# Patient Record
Sex: Female | Born: 1935 | Race: White | Hispanic: No | Smoking: Former smoker
Health system: Southern US, Community
[De-identification: ages and names within clinical notes are randomized; demographics above are authoritative.]

## PROBLEM LIST (undated history)

## (undated) DIAGNOSIS — C9190 Lymphoid leukemia, unspecified not having achieved remission: Secondary | ICD-10-CM

## (undated) DIAGNOSIS — A159 Respiratory tuberculosis unspecified: Secondary | ICD-10-CM

## (undated) DIAGNOSIS — I10 Essential (primary) hypertension: Secondary | ICD-10-CM

## (undated) DIAGNOSIS — H9190 Unspecified hearing loss, unspecified ear: Secondary | ICD-10-CM

## (undated) DIAGNOSIS — E785 Hyperlipidemia, unspecified: Secondary | ICD-10-CM

## (undated) DIAGNOSIS — I4510 Unspecified right bundle-branch block: Secondary | ICD-10-CM

## (undated) DIAGNOSIS — M1990 Unspecified osteoarthritis, unspecified site: Secondary | ICD-10-CM

## (undated) DIAGNOSIS — M8580 Other specified disorders of bone density and structure, unspecified site: Secondary | ICD-10-CM

## (undated) DIAGNOSIS — I509 Heart failure, unspecified: Secondary | ICD-10-CM

## (undated) DIAGNOSIS — D649 Anemia, unspecified: Secondary | ICD-10-CM

## (undated) DIAGNOSIS — C50919 Malignant neoplasm of unspecified site of unspecified female breast: Secondary | ICD-10-CM

## (undated) HISTORY — PX: DIAGNOSTIC LAPAROSCOPY: SUR761

## (undated) HISTORY — PX: APPENDECTOMY: SHX54

## (undated) HISTORY — PX: EYE SURGERY: SHX253

---

## 2000-08-15 ENCOUNTER — Ambulatory Visit (HOSPITAL_BASED_OUTPATIENT_CLINIC_OR_DEPARTMENT_OTHER): Admission: RE | Admit: 2000-08-15 | Discharge: 2000-08-15 | Payer: Self-pay | Admitting: General Surgery

## 2000-08-18 ENCOUNTER — Encounter: Payer: Self-pay | Admitting: Family Medicine

## 2000-08-18 ENCOUNTER — Other Ambulatory Visit: Admission: RE | Admit: 2000-08-18 | Discharge: 2000-08-18 | Payer: Self-pay | Admitting: Family Medicine

## 2000-08-18 ENCOUNTER — Encounter: Admission: RE | Admit: 2000-08-18 | Discharge: 2000-08-18 | Payer: Self-pay | Admitting: Family Medicine

## 2000-08-22 ENCOUNTER — Encounter: Admission: RE | Admit: 2000-08-22 | Discharge: 2000-08-22 | Payer: Self-pay | Admitting: Family Medicine

## 2000-08-22 ENCOUNTER — Encounter: Payer: Self-pay | Admitting: Family Medicine

## 2001-08-30 ENCOUNTER — Encounter: Payer: Self-pay | Admitting: Family Medicine

## 2001-08-30 ENCOUNTER — Encounter: Admission: RE | Admit: 2001-08-30 | Discharge: 2001-08-30 | Payer: Self-pay | Admitting: Family Medicine

## 2002-10-07 ENCOUNTER — Encounter: Payer: Self-pay | Admitting: Family Medicine

## 2002-10-07 ENCOUNTER — Encounter: Admission: RE | Admit: 2002-10-07 | Discharge: 2002-10-07 | Payer: Self-pay | Admitting: Family Medicine

## 2002-10-29 ENCOUNTER — Encounter: Payer: Self-pay | Admitting: Family Medicine

## 2002-10-29 ENCOUNTER — Other Ambulatory Visit: Admission: RE | Admit: 2002-10-29 | Discharge: 2002-10-29 | Payer: Self-pay | Admitting: Family Medicine

## 2002-10-29 ENCOUNTER — Encounter: Admission: RE | Admit: 2002-10-29 | Discharge: 2002-10-29 | Payer: Self-pay | Admitting: Family Medicine

## 2003-04-17 ENCOUNTER — Encounter: Admission: RE | Admit: 2003-04-17 | Discharge: 2003-04-17 | Payer: Self-pay | Admitting: Family Medicine

## 2003-04-17 ENCOUNTER — Encounter: Payer: Self-pay | Admitting: Family Medicine

## 2003-04-29 ENCOUNTER — Encounter: Admission: RE | Admit: 2003-04-29 | Discharge: 2003-04-29 | Payer: Self-pay | Admitting: Family Medicine

## 2003-04-29 ENCOUNTER — Encounter: Payer: Self-pay | Admitting: Family Medicine

## 2003-06-17 ENCOUNTER — Encounter: Admission: RE | Admit: 2003-06-17 | Discharge: 2003-07-11 | Payer: Self-pay | Admitting: Family Medicine

## 2003-07-16 ENCOUNTER — Encounter: Admission: RE | Admit: 2003-07-16 | Discharge: 2003-10-14 | Payer: Self-pay | Admitting: Family Medicine

## 2003-10-28 ENCOUNTER — Encounter: Admission: RE | Admit: 2003-10-28 | Discharge: 2003-10-28 | Payer: Self-pay | Admitting: Family Medicine

## 2003-10-29 ENCOUNTER — Encounter: Admission: RE | Admit: 2003-10-29 | Discharge: 2003-10-29 | Payer: Self-pay | Admitting: Family Medicine

## 2003-11-07 ENCOUNTER — Ambulatory Visit (HOSPITAL_COMMUNITY): Admission: RE | Admit: 2003-11-07 | Discharge: 2003-11-07 | Payer: Self-pay | Admitting: Family Medicine

## 2004-07-14 ENCOUNTER — Other Ambulatory Visit: Admission: RE | Admit: 2004-07-14 | Discharge: 2004-07-14 | Payer: Self-pay | Admitting: Family Medicine

## 2004-11-29 ENCOUNTER — Encounter: Admission: RE | Admit: 2004-11-29 | Discharge: 2004-11-29 | Payer: Self-pay | Admitting: Family Medicine

## 2005-01-21 ENCOUNTER — Other Ambulatory Visit: Admission: RE | Admit: 2005-01-21 | Discharge: 2005-01-21 | Payer: Self-pay | Admitting: Family Medicine

## 2005-07-28 ENCOUNTER — Other Ambulatory Visit: Admission: RE | Admit: 2005-07-28 | Discharge: 2005-07-28 | Payer: Self-pay | Admitting: Family Medicine

## 2005-12-01 ENCOUNTER — Encounter: Admission: RE | Admit: 2005-12-01 | Discharge: 2005-12-01 | Payer: Self-pay | Admitting: Family Medicine

## 2005-12-07 ENCOUNTER — Encounter: Admission: RE | Admit: 2005-12-07 | Discharge: 2005-12-07 | Payer: Self-pay | Admitting: Family Medicine

## 2006-05-04 ENCOUNTER — Encounter: Admission: RE | Admit: 2006-05-04 | Discharge: 2006-05-04 | Payer: Self-pay | Admitting: Family Medicine

## 2006-06-05 ENCOUNTER — Encounter: Admission: RE | Admit: 2006-06-05 | Discharge: 2006-06-05 | Payer: Self-pay | Admitting: Family Medicine

## 2006-06-13 ENCOUNTER — Encounter (INDEPENDENT_AMBULATORY_CARE_PROVIDER_SITE_OTHER): Payer: Self-pay | Admitting: Diagnostic Radiology

## 2006-06-13 ENCOUNTER — Encounter: Admission: RE | Admit: 2006-06-13 | Discharge: 2006-06-13 | Payer: Self-pay | Admitting: Family Medicine

## 2006-06-13 ENCOUNTER — Encounter (INDEPENDENT_AMBULATORY_CARE_PROVIDER_SITE_OTHER): Payer: Self-pay | Admitting: Specialist

## 2006-06-15 ENCOUNTER — Encounter: Admission: RE | Admit: 2006-06-15 | Discharge: 2006-06-15 | Payer: Self-pay | Admitting: Family Medicine

## 2006-06-19 ENCOUNTER — Ambulatory Visit: Payer: Self-pay | Admitting: Cardiology

## 2006-06-21 ENCOUNTER — Encounter: Admission: RE | Admit: 2006-06-21 | Discharge: 2006-06-21 | Payer: Self-pay | Admitting: Family Medicine

## 2006-07-11 HISTORY — PX: BREAST SURGERY: SHX581

## 2006-07-11 HISTORY — PX: BIOPSY BREAST: PRO8

## 2006-07-12 ENCOUNTER — Ambulatory Visit (HOSPITAL_BASED_OUTPATIENT_CLINIC_OR_DEPARTMENT_OTHER): Admission: RE | Admit: 2006-07-12 | Discharge: 2006-07-12 | Payer: Self-pay | Admitting: General Surgery

## 2006-07-12 ENCOUNTER — Encounter (INDEPENDENT_AMBULATORY_CARE_PROVIDER_SITE_OTHER): Payer: Self-pay | Admitting: Specialist

## 2006-07-12 ENCOUNTER — Encounter: Admission: RE | Admit: 2006-07-12 | Discharge: 2006-07-12 | Payer: Self-pay | Admitting: General Surgery

## 2006-07-12 DIAGNOSIS — D0590 Unspecified type of carcinoma in situ of unspecified breast: Secondary | ICD-10-CM

## 2006-07-19 ENCOUNTER — Ambulatory Visit: Payer: Self-pay | Admitting: Oncology

## 2006-08-01 ENCOUNTER — Ambulatory Visit: Admission: RE | Admit: 2006-08-01 | Discharge: 2006-10-11 | Payer: Self-pay | Admitting: Radiation Oncology

## 2006-08-07 ENCOUNTER — Encounter: Payer: Self-pay | Admitting: Oncology

## 2006-08-07 ENCOUNTER — Other Ambulatory Visit: Admission: RE | Admit: 2006-08-07 | Discharge: 2006-08-07 | Payer: Self-pay | Admitting: Oncology

## 2006-08-07 LAB — COMPREHENSIVE METABOLIC PANEL
AST: 21 U/L (ref 0–37)
Albumin: 4.2 g/dL (ref 3.5–5.2)
BUN: 20 mg/dL (ref 6–23)
CO2: 27 mEq/L (ref 19–32)
Calcium: 9.7 mg/dL (ref 8.4–10.5)
Chloride: 102 mEq/L (ref 96–112)
Glucose, Bld: 125 mg/dL — ABNORMAL HIGH (ref 70–99)
Potassium: 3.7 mEq/L (ref 3.5–5.3)

## 2006-08-07 LAB — CBC WITH DIFFERENTIAL/PLATELET
Basophils Absolute: 0.2 10*3/uL — ABNORMAL HIGH (ref 0.0–0.1)
Eosinophils Absolute: 0.2 10*3/uL (ref 0.0–0.5)
HCT: 42.3 % (ref 34.8–46.6)
HGB: 14.3 g/dL (ref 11.6–15.9)
MONO#: 0.7 10*3/uL (ref 0.1–0.9)
NEUT%: 43.2 % (ref 39.6–76.8)
WBC: 17.5 10*3/uL — ABNORMAL HIGH (ref 3.9–10.0)
lymph#: 8.9 10*3/uL — ABNORMAL HIGH (ref 0.9–3.3)

## 2006-08-14 ENCOUNTER — Other Ambulatory Visit: Admission: RE | Admit: 2006-08-14 | Discharge: 2006-08-14 | Payer: Self-pay | Admitting: Family Medicine

## 2006-08-16 ENCOUNTER — Other Ambulatory Visit: Admission: RE | Admit: 2006-08-16 | Discharge: 2006-08-16 | Payer: Self-pay | Admitting: Oncology

## 2006-08-16 ENCOUNTER — Encounter: Payer: Self-pay | Admitting: Oncology

## 2006-08-16 LAB — CBC WITH DIFFERENTIAL/PLATELET
BASO%: 0.6 % (ref 0.0–2.0)
EOS%: 0.7 % (ref 0.0–7.0)
HCT: 41.3 % (ref 34.8–46.6)
LYMPH%: 50.3 % — ABNORMAL HIGH (ref 14.0–48.0)
MCH: 27.5 pg (ref 26.0–34.0)
MCHC: 34.1 g/dL (ref 32.0–36.0)
NEUT%: 43.9 % (ref 39.6–76.8)
Platelets: 356 10*3/uL (ref 145–400)
RBC: 5.11 10*6/uL (ref 3.70–5.32)
lymph#: 7.2 10*3/uL — ABNORMAL HIGH (ref 0.9–3.3)

## 2006-08-16 LAB — MORPHOLOGY: RBC Comments: NORMAL

## 2006-08-16 LAB — IGG, IGA, IGM
IgA: 403 mg/dL — ABNORMAL HIGH (ref 68–378)
IgM, Serum: 27 mg/dL — ABNORMAL LOW (ref 60–263)

## 2006-10-09 ENCOUNTER — Ambulatory Visit: Payer: Self-pay | Admitting: Oncology

## 2006-10-11 LAB — CBC WITH DIFFERENTIAL/PLATELET
Basophils Absolute: 0.1 10*3/uL (ref 0.0–0.1)
Eosinophils Absolute: 0.1 10*3/uL (ref 0.0–0.5)
HCT: 39.2 % (ref 34.8–46.6)
HGB: 13.8 g/dL (ref 11.6–15.9)
MCV: 81.1 fL (ref 81.0–101.0)
MONO%: 5.7 % (ref 0.0–13.0)
NEUT#: 5.4 10*3/uL (ref 1.5–6.5)
NEUT%: 61.7 % (ref 39.6–76.8)
RDW: 14.4 % (ref 11.3–14.5)
lymph#: 2.7 10*3/uL (ref 0.9–3.3)

## 2006-10-16 ENCOUNTER — Ambulatory Visit (HOSPITAL_COMMUNITY): Admission: RE | Admit: 2006-10-16 | Discharge: 2006-10-16 | Payer: Self-pay | Admitting: Oncology

## 2006-10-16 ENCOUNTER — Encounter: Payer: Self-pay | Admitting: Vascular Surgery

## 2006-10-16 ENCOUNTER — Ambulatory Visit: Payer: Self-pay | Admitting: Vascular Surgery

## 2006-12-29 ENCOUNTER — Ambulatory Visit: Payer: Self-pay | Admitting: Oncology

## 2007-01-03 LAB — CBC WITH DIFFERENTIAL/PLATELET
Basophils Absolute: 0 10*3/uL (ref 0.0–0.1)
Eosinophils Absolute: 0.1 10*3/uL (ref 0.0–0.5)
HGB: 13.1 g/dL (ref 11.6–15.9)
LYMPH%: 33.6 % (ref 14.0–48.0)
MCV: 82.8 fL (ref 81.0–101.0)
MONO%: 6.3 % (ref 0.0–13.0)
NEUT#: 5.3 10*3/uL (ref 1.5–6.5)
Platelets: 326 10*3/uL (ref 145–400)

## 2007-01-03 LAB — COMPREHENSIVE METABOLIC PANEL
AST: 16 U/L (ref 0–37)
Albumin: 3.8 g/dL (ref 3.5–5.2)
BUN: 23 mg/dL (ref 6–23)
CO2: 28 mEq/L (ref 19–32)
Calcium: 9.3 mg/dL (ref 8.4–10.5)
Chloride: 104 mEq/L (ref 96–112)
Glucose, Bld: 175 mg/dL — ABNORMAL HIGH (ref 70–99)
Potassium: 3.9 mEq/L (ref 3.5–5.3)

## 2007-02-13 ENCOUNTER — Encounter: Admission: RE | Admit: 2007-02-13 | Discharge: 2007-02-13 | Payer: Self-pay | Admitting: Family Medicine

## 2007-05-15 ENCOUNTER — Ambulatory Visit: Payer: Self-pay | Admitting: Oncology

## 2007-05-17 LAB — COMPREHENSIVE METABOLIC PANEL
Albumin: 3.9 g/dL (ref 3.5–5.2)
Alkaline Phosphatase: 52 U/L (ref 39–117)
BUN: 30 mg/dL — ABNORMAL HIGH (ref 6–23)
Glucose, Bld: 162 mg/dL — ABNORMAL HIGH (ref 70–99)
Total Bilirubin: 0.4 mg/dL (ref 0.3–1.2)

## 2007-05-17 LAB — CBC WITH DIFFERENTIAL/PLATELET
Eosinophils Absolute: 0.1 10*3/uL (ref 0.0–0.5)
MCV: 83.2 fL (ref 81.0–101.0)
MONO%: 5.5 % (ref 0.0–13.0)
NEUT#: 6.4 10*3/uL (ref 1.5–6.5)
RBC: 4.81 10*6/uL (ref 3.70–5.32)
RDW: 14.6 % — ABNORMAL HIGH (ref 11.3–14.5)
WBC: 10.9 10*3/uL — ABNORMAL HIGH (ref 3.9–10.0)

## 2007-11-20 ENCOUNTER — Ambulatory Visit: Payer: Self-pay | Admitting: Oncology

## 2008-02-14 ENCOUNTER — Encounter: Admission: RE | Admit: 2008-02-14 | Discharge: 2008-02-14 | Payer: Self-pay | Admitting: Oncology

## 2008-02-22 ENCOUNTER — Encounter: Admission: RE | Admit: 2008-02-22 | Discharge: 2008-02-22 | Payer: Self-pay | Admitting: Oncology

## 2008-05-16 ENCOUNTER — Ambulatory Visit: Payer: Self-pay | Admitting: Oncology

## 2008-05-20 LAB — CBC WITH DIFFERENTIAL/PLATELET
Basophils Absolute: 0 10*3/uL (ref 0.0–0.1)
Eosinophils Absolute: 0.2 10*3/uL (ref 0.0–0.5)
HGB: 14.4 g/dL (ref 11.6–15.9)
LYMPH%: 44.4 % (ref 14.0–48.0)
MCV: 85.3 fL (ref 81.0–101.0)
MONO%: 6.4 % (ref 0.0–13.0)
NEUT#: 6.2 10*3/uL (ref 1.5–6.5)
NEUT%: 47.7 % (ref 39.6–76.8)
Platelets: 313 10*3/uL (ref 145–400)

## 2008-05-20 LAB — COMPREHENSIVE METABOLIC PANEL
CO2: 28 mEq/L (ref 19–32)
Creatinine, Ser: 0.78 mg/dL (ref 0.40–1.20)
Glucose, Bld: 104 mg/dL — ABNORMAL HIGH (ref 70–99)
Total Bilirubin: 0.4 mg/dL (ref 0.3–1.2)
Total Protein: 7.7 g/dL (ref 6.0–8.3)

## 2008-05-20 LAB — LACTATE DEHYDROGENASE: LDH: 142 U/L (ref 94–250)

## 2008-05-27 ENCOUNTER — Other Ambulatory Visit: Admission: RE | Admit: 2008-05-27 | Discharge: 2008-05-27 | Payer: Self-pay | Admitting: Oncology

## 2008-05-27 ENCOUNTER — Encounter: Payer: Self-pay | Admitting: Oncology

## 2008-05-27 LAB — CBC WITH DIFFERENTIAL/PLATELET
Eosinophils Absolute: 0.1 10*3/uL (ref 0.0–0.5)
HGB: 14.2 g/dL (ref 11.6–15.9)
MONO#: 0.6 10*3/uL (ref 0.1–0.9)
NEUT#: 6.4 10*3/uL (ref 1.5–6.5)
Platelets: 330 10*3/uL (ref 145–400)
RBC: 4.93 10*6/uL (ref 3.70–5.32)
RDW: 13.7 % (ref 11.3–14.5)
WBC: 13.6 10*3/uL — ABNORMAL HIGH (ref 3.9–10.0)

## 2008-06-03 LAB — FLOW CYTOMETRY

## 2008-07-11 HISTORY — PX: ABDOMINAL HYSTERECTOMY: SHX81

## 2008-07-11 HISTORY — PX: ENDOMETRIAL BIOPSY: SHX622

## 2008-07-15 ENCOUNTER — Other Ambulatory Visit: Admission: RE | Admit: 2008-07-15 | Discharge: 2008-07-15 | Payer: Self-pay | Admitting: Family Medicine

## 2008-08-11 DIAGNOSIS — I10 Essential (primary) hypertension: Secondary | ICD-10-CM

## 2008-08-11 DIAGNOSIS — I519 Heart disease, unspecified: Secondary | ICD-10-CM

## 2008-08-11 DIAGNOSIS — E785 Hyperlipidemia, unspecified: Secondary | ICD-10-CM

## 2008-08-11 DIAGNOSIS — I4510 Unspecified right bundle-branch block: Secondary | ICD-10-CM

## 2008-08-12 ENCOUNTER — Ambulatory Visit: Payer: Self-pay | Admitting: Cardiology

## 2008-08-12 ENCOUNTER — Encounter: Payer: Self-pay | Admitting: Cardiology

## 2008-08-12 ENCOUNTER — Ambulatory Visit: Payer: Self-pay

## 2008-08-20 ENCOUNTER — Ambulatory Visit: Payer: Self-pay

## 2008-12-03 ENCOUNTER — Ambulatory Visit: Payer: Self-pay | Admitting: Oncology

## 2008-12-16 ENCOUNTER — Encounter: Payer: Self-pay | Admitting: Cardiology

## 2008-12-16 LAB — CBC WITH DIFFERENTIAL/PLATELET
BASO%: 0.6 % (ref 0.0–2.0)
LYMPH%: 56.6 % — ABNORMAL HIGH (ref 14.0–49.7)
MCHC: 32.8 g/dL (ref 31.5–36.0)
MCV: 85 fL (ref 79.5–101.0)
MONO%: 10.7 % (ref 0.0–14.0)
Platelets: 216 10*3/uL (ref 145–400)
RBC: 3.87 10*6/uL (ref 3.70–5.45)
WBC: 6.9 10*3/uL (ref 3.9–10.3)

## 2008-12-16 LAB — MORPHOLOGY

## 2008-12-17 LAB — COMPREHENSIVE METABOLIC PANEL
Albumin: 3.6 g/dL (ref 3.5–5.2)
BUN: 18 mg/dL (ref 6–23)
Calcium: 9.3 mg/dL (ref 8.4–10.5)
Chloride: 101 mEq/L (ref 96–112)
Glucose, Bld: 174 mg/dL — ABNORMAL HIGH (ref 70–99)
Potassium: 3.5 mEq/L (ref 3.5–5.3)

## 2008-12-17 LAB — CANCER ANTIGEN 27.29: CA 27.29: 28 U/mL (ref 0–39)

## 2009-01-09 ENCOUNTER — Ambulatory Visit: Payer: Self-pay | Admitting: Genetic Counselor

## 2009-02-17 ENCOUNTER — Encounter: Admission: RE | Admit: 2009-02-17 | Discharge: 2009-02-17 | Payer: Self-pay | Admitting: Oncology

## 2009-05-07 ENCOUNTER — Encounter (INDEPENDENT_AMBULATORY_CARE_PROVIDER_SITE_OTHER): Payer: Self-pay | Admitting: Orthopedic Surgery

## 2009-05-07 ENCOUNTER — Ambulatory Visit (HOSPITAL_BASED_OUTPATIENT_CLINIC_OR_DEPARTMENT_OTHER): Admission: RE | Admit: 2009-05-07 | Discharge: 2009-05-07 | Payer: Self-pay | Admitting: Orthopedic Surgery

## 2009-07-11 HISTORY — PX: GANGLION CYST EXCISION: SHX1691

## 2009-11-02 ENCOUNTER — Encounter (INDEPENDENT_AMBULATORY_CARE_PROVIDER_SITE_OTHER): Payer: Self-pay | Admitting: *Deleted

## 2009-11-16 ENCOUNTER — Encounter (INDEPENDENT_AMBULATORY_CARE_PROVIDER_SITE_OTHER): Payer: Self-pay | Admitting: *Deleted

## 2009-11-17 ENCOUNTER — Ambulatory Visit: Payer: Self-pay | Admitting: Internal Medicine

## 2009-12-01 ENCOUNTER — Ambulatory Visit: Payer: Self-pay | Admitting: Internal Medicine

## 2009-12-08 ENCOUNTER — Ambulatory Visit: Payer: Self-pay | Admitting: Oncology

## 2009-12-09 LAB — COMPREHENSIVE METABOLIC PANEL
ALT: 9 U/L (ref 0–35)
AST: 17 U/L (ref 0–37)
Glucose, Bld: 204 mg/dL — ABNORMAL HIGH (ref 70–99)
Potassium: 3.9 mEq/L (ref 3.5–5.3)
Sodium: 139 mEq/L (ref 135–145)
Total Protein: 6.8 g/dL (ref 6.0–8.3)

## 2009-12-09 LAB — CBC WITH DIFFERENTIAL/PLATELET
Basophils Absolute: 0.2 10*3/uL — ABNORMAL HIGH (ref 0.0–0.1)
EOS%: 0.5 % (ref 0.0–7.0)
Eosinophils Absolute: 0.1 10*3/uL (ref 0.0–0.5)
LYMPH%: 56.2 % — ABNORMAL HIGH (ref 14.0–49.7)
MCH: 30.6 pg (ref 25.1–34.0)
MONO%: 4.6 % (ref 0.0–14.0)
NEUT%: 37.2 % — ABNORMAL LOW (ref 38.4–76.8)
RBC: 4.29 10*6/uL (ref 3.70–5.45)
WBC: 15.3 10*3/uL — ABNORMAL HIGH (ref 3.9–10.3)

## 2009-12-09 LAB — VITAMIN D 25 HYDROXY (VIT D DEFICIENCY, FRACTURES): Vit D, 25-Hydroxy: 37 ng/mL (ref 30–89)

## 2009-12-09 LAB — CANCER ANTIGEN 27.29: CA 27.29: 13 U/mL (ref 0–39)

## 2009-12-09 LAB — LACTATE DEHYDROGENASE: LDH: 128 U/L (ref 94–250)

## 2009-12-14 ENCOUNTER — Encounter: Payer: Self-pay | Admitting: Cardiology

## 2010-02-19 ENCOUNTER — Encounter: Admission: RE | Admit: 2010-02-19 | Discharge: 2010-02-19 | Payer: Self-pay | Admitting: Oncology

## 2010-06-16 ENCOUNTER — Ambulatory Visit: Payer: Self-pay | Admitting: Oncology

## 2010-06-18 LAB — CBC WITH DIFFERENTIAL/PLATELET
Basophils Absolute: 0.1 10*3/uL (ref 0.0–0.1)
HCT: 36.7 % (ref 34.8–46.6)
HGB: 12.4 g/dL (ref 11.6–15.9)
LYMPH%: 59.5 % — ABNORMAL HIGH (ref 14.0–49.7)
NEUT%: 35.3 % — ABNORMAL LOW (ref 38.4–76.8)
Platelets: 227 10*3/uL (ref 145–400)
RBC: 4.13 10*6/uL (ref 3.70–5.45)
RDW: 14.3 % (ref 11.2–14.5)

## 2010-06-18 LAB — COMPREHENSIVE METABOLIC PANEL
ALT: 14 U/L (ref 0–35)
Alkaline Phosphatase: 41 U/L (ref 39–117)
Calcium: 9.4 mg/dL (ref 8.4–10.5)
Glucose, Bld: 139 mg/dL — ABNORMAL HIGH (ref 70–99)
Total Bilirubin: 0.4 mg/dL (ref 0.3–1.2)
Total Protein: 6.8 g/dL (ref 6.0–8.3)

## 2010-06-21 LAB — BETA 2 MICROGLOBULIN, SERUM: Beta-2 Microglobulin: 2.71 mg/L — ABNORMAL HIGH (ref 1.01–1.73)

## 2010-06-25 ENCOUNTER — Encounter: Payer: Self-pay | Admitting: Cardiology

## 2010-08-01 ENCOUNTER — Encounter: Payer: Self-pay | Admitting: Family Medicine

## 2010-08-10 NOTE — Letter (Signed)
[facility]   Imported By: Roderic Ovens 01/23/2010 10:05:23  _____________________________________________________________________  External Attachment:    Type:   Image     Comment:   External Document

## 2010-08-10 NOTE — Procedures (Signed)
Summary: Colonoscopy  Patient: Margaret Mann Note: All result statuses are Final unless otherwise noted.  Tests: (1) Colonoscopy (COL)   COL Colonoscopy           DONE     [facility]     [address]     [place], [place]  [address]           COLONOSCOPY PROCEDURE REPORT           PATIENT:  Margaret, Mann  MR#:  [id number]     BIRTHDATE:  01/17/1933, 76 yrs. old  GENDER:  female     ENDOSCOPIST:  Iva Boop, MD, [facility]           PROCEDURE DATE:  12/01/2009     PROCEDURE:  Higher-risk screening colonoscopy G0105           ASA CLASS:  Class III     INDICATIONS:  surveillance and high-risk screening, personal     history of colon polyps     1993 3 mm polyp destroyed     1997 3 polyps removed/destroyed (no pathology available)     2000 no polyps     2004 no polyps     mother with colon cancer diagnosed in her 26's     MEDICATIONS:   Fentanyl 50 mcg IV, Versed 5 mg IV           DESCRIPTION OF PROCEDURE:   After the risks benefits and     alternatives of the procedure were thoroughly explained, informed     consent was obtained.  Digital rectal exam was performed and     revealed no abnormalities.   The LB PCF-H180AL [id number] endoscope     was introduced through the anus and advanced to the cecum, which     was identified by both the appendix and ileocecal valve, without     limitations.  The quality of the prep was good, using MoviPrep.     The instrument was then slowly withdrawn as the colon was fully     examined.     Insertion: 4:47 minutes Withdrawal: 8:43 minutes     <<PROCEDUREIMAGES>>           FINDINGS:  Mild diverticulosis was found in the sigmoid colon.     This was otherwise a normal examination of the colon.   Retroflexed     views in the rectum revealed internal and external hemorrhoids.     The scope was then withdrawn from the patient and the procedure     completed.           COMPLICATIONS:  None     ENDOSCOPIC IMPRESSION:     1) Mild  diverticulosis in the sigmoid colon     2) Otherwise normal examination of the colon with excellent prep           3) Internal and external hemorrhoids in the ano-rectum     4) Personal history of colon polyps removed 1993 and 1997, none     nce     5) Elderly mother with colon cancer (>70)           REPEAT EXAM:  In 5 year(s) for Office Visit.  She has had     endometrial cancer also and can be reassessed for colon cancer     screening/surveillance in 5 years but would not place in for     routine repeat colonoscopy at this time.  Iva Boop, MD, [facility]           CC:  Maurice Small, MD     Pierce Crane, MD     The Patient           n.     eSIGNED:   Iva Boop at 12/01/2009 11:43 AM           Artist Pais, [id number]  Note: An exclamation mark (!) indicates a result that was not dispersed into the flowsheet. Document Creation Date: 12/01/2009 11:44 AM _______________________________________________________________________  (1) Order result status: Final Collection or observation date-time: 12/01/2009 11:31 Requested date-time:  Receipt date-time:  Reported date-time:  Referring Physician:   Ordering Physician: Stan Head [phone]) Specimen Source:  Source: Launa Grill Order Number: [id number] Lab site:

## 2010-08-10 NOTE — Letter (Signed)
Summary: [facility] Instructions  [facility]  [address] [place], [place] [address]   Phone: [phone]  Fax: [phone]       ICA DAYE    1933/04/27    MRN: [id number]        Procedure Day Dorna Bloom:  Jake Shark  12/01/09     Arrival Time:  10:30AM     Procedure Time:  11:30AM     Location of Procedure:                    _ X_  [facility] (4th Floor)                       PREPARATION FOR COLONOSCOPY WITH MOVIPREP   Starting 5 days prior to your procedure 11/26/09 do not eat nuts, seeds, popcorn, corn, beans, peas,  salads, or any raw vegetables.  Do not take any fiber supplements (e.g. Metamucil, Citrucel, and Benefiber).  THE DAY BEFORE YOUR PROCEDURE         DATE: 11/30/09  DAY: MONDAY  1.  Drink clear liquids the entire day-NO SOLID FOOD  2.  Do not drink anything colored red or purple.  Avoid juices with pulp.  No orange juice.  3.  Drink at least 64 oz. (8 glasses) of fluid/clear liquids during the day to prevent dehydration and help the prep work efficiently.  CLEAR LIQUIDS INCLUDE: Water Jello Ice Popsicles Tea (sugar ok, no milk/cream) Powdered fruit flavored drinks Coffee (sugar ok, no milk/cream) Gatorade Juice: apple, white grape, white cranberry  Lemonade Clear bullion, consomm, broth Carbonated beverages (any kind) Strained chicken noodle soup Hard Candy                             4.  In the morning, mix first dose of MoviPrep solution:    Empty 1 Pouch A and 1 Pouch B into the disposable container    Add lukewarm drinking water to the top line of the container. Mix to dissolve    Refrigerate (mixed solution should be used within 24 hrs)  5.  Begin drinking the prep at 5:00 p.m. The MoviPrep container is divided by 4 marks.   Every 15 minutes drink the solution down to the next mark (approximately 8 oz) until the full liter is complete.   6.  Follow completed prep with 16 oz of clear liquid of your choice  (Nothing red or purple).  Continue to drink clear liquids until bedtime.  7.  Before going to bed, mix second dose of MoviPrep solution:    Empty 1 Pouch A and 1 Pouch B into the disposable container    Add lukewarm drinking water to the top line of the container. Mix to dissolve    Refrigerate  THE DAY OF YOUR PROCEDURE      DATE: 12/01/09  DAY: TUESDAY  Beginning at 6:30AM (5 hours before procedure):         1. Every 15 minutes, drink the solution down to the next mark (approx 8 oz) until the full liter is complete.  2. Follow completed prep with 16 oz. of clear liquid of your choice.    3. You may drink clear liquids until 9:30AM (2 HOURS BEFORE PROCEDURE).   MEDICATION INSTRUCTIONS  Unless otherwise instructed, you should take regular prescription medications with a small sip of water   as early as possible the morning of  your procedure.   Additional medication instructions: Hold HCTZ the morning of procedure.         OTHER INSTRUCTIONS  You will need a responsible adult at least 75 years of age to accompany you and drive you home.   This person must remain in the waiting room during your procedure.  Wear loose fitting clothing that is easily removed.  Leave jewelry and other valuables at home.  However, you may wish to bring a book to read or  an iPod/MP3 player to listen to music as you wait for your procedure to start.  Remove all body piercing jewelry and leave at home.  Total time from sign-in until discharge is approximately 2-3 hours.  You should go home directly after your procedure and rest.  You can resume normal activities the  day after your procedure.  The day of your procedure you should not:   Drive   Make legal decisions   Operate machinery   Drink alcohol   Return to work  You will receive specific instructions about eating, activities and medications before you leave.    The above instructions have been reviewed and explained to  me by   _______________________    I fully understand and can verbalize these instructions _____________________________ Date _________

## 2010-08-10 NOTE — Letter (Signed)
Summary: Colonoscopy Letter  [facility]  [address] [place], [place] [address]   Phone: [phone]  Fax: [phone]      November 02, 2009 MRN: [id number]   Margaret Mann [address] [place], [place]  [address]   Dear Ms. Feehan,   According to your medical record, it is time for you to schedule a Colonoscopy. The American Cancer Society recommends this procedure as a method to detect early colon cancer. Patients with a family history of colon cancer, or a personal history of colon polyps or inflammatory bowel disease are at increased risk.  This letter has beeen generated based on the recommendations made at the time of your procedure. If you feel that in your particular situation this may no longer apply, please contact our office.  Please call our office at [phone] to schedule this appointment or to update your records at your earliest convenience.  Thank you for cooperating with [place] to provide you with the very best care possible.   Sincerely,  Iva Boop, M.D.  [facility]astro[facility] [phone]

## 2010-08-10 NOTE — Miscellaneous (Signed)
Summary: [facility] Previsit/prep  Clinical Lists Changes  Medications: Added new medication of MOVIPREP 100 GM  SOLR (PEG-KCL-NACL-NASULF-NA ASC-C) As per prep instructions. - Signed Rx of MOVIPREP 100 GM  SOLR (PEG-KCL-NACL-NASULF-NA ASC-C) As per prep instructions.;  #1 x 0;  Signed;  Entered by: Wyona Almas RN;  Authorized by: Iva Boop MD, [facility];  Method used: [facility]  [address] [phone]*, [address] [place], [place], [place]  [id number], Ph: [phone] or [phone], Fax: [phone] Observations: Added new observation of NKA: T (11/17/2009 13:46)    Prescriptions: MOVIPREP 100 GM  SOLR (PEG-KCL-NACL-NASULF-NA ASC-C) As per prep instructions.  #1 x 0   Entered by:   Wyona Almas RN   Authorized by:   Iva Boop MD, [facility]   Signed by:   Wyona Almas RN on 11/17/2009   Method used:   [facility]  [address] [phone]* (retail)       [address]       [place], [place]  [id number]       Ph: [phone] or [phone]       Fax: [phone]   RxID:   [id number]

## 2010-08-18 NOTE — Letter (Signed)
[facility]: [facility]   Imported By: Marylou Mccoy 08/09/2010 17:31:27  _____________________________________________________________________  External Attachment:    Type:   Image     Comment:   External Document

## 2010-09-21 NOTE — Progress Notes (Signed)
Summary: [facility]: Office Visit  [facility]: Office Visit   Imported By: Earl Many 09/10/2010 18:13:30  _____________________________________________________________________  External Attachment:    Type:   Image     Comment:   External Document

## 2010-10-14 LAB — POCT I-STAT, CHEM 8
BUN: 24 mg/dL — ABNORMAL HIGH (ref 6–23)
Chloride: 103 mEq/L (ref 96–112)
Creatinine, Ser: 0.8 mg/dL (ref 0.4–1.2)
Potassium: 3.3 mEq/L — ABNORMAL LOW (ref 3.5–5.1)
Sodium: 139 mEq/L (ref 135–145)

## 2010-11-23 NOTE — Letter (Signed)
August 14, 2008    Madaline Guthrie, MD  [facility]  [address]  [place], [place] [address]   RE:  ANGELLINA, FERDINAND  MRN:  [id number]  /  DOB:  04/12/1933   Dear Dr. Clifton James,   I recently saw Ms. Lamountain for evaluation prior to surgery for treatment  of uterine cancer.  She has a past history of heart failure with a well-  preserved ejection fraction.  She has had no recent symptoms.  She has a  relatively reasonable functional level.  I did evaluate her with a  repeat echocardiogram which demonstrates her ejection fraction to still  be 60%.  She does have some evidence of diastolic dysfunction with  abnormal left ventricular relaxation.  Therefore, attention should be  paid to volume management during her surgery and afterwards.  Of note,  she also had a prolonged QT on her EKG.  This is a new finding and is  probably med related.  She has had no symptomatic dysrhythmias.   Based on the above, there are no high-risk features that would preclude  the planned surgery.  She is at acceptable risk according to ACC/AHA  guidelines with again attention paid to volume management in particular.   If you have any questions about this patient, please do not hesitate to  give me a call.  My cell phone numbers [phone].  You can reach me  at the office at [phone] as well.  Thank you for your help with  this pleasant patient.    Sincerely,      Rollene Rotunda, MD, [facility]  Electronically Signed    JH/MedQ  DD: 08/14/2008  DT: 08/15/2008  Job #: [id number]

## 2010-11-23 NOTE — Assessment & Plan Note (Signed)
[facility]                            CARDIOLOGY OFFICE NOTE   NAME:Margaret Mann, Margaret Mann                       MRN:          [id number]  DATE:08/12/2008                            DOB:          12-11-1932    PRIMARY CARE PHYSICIAN:  Gretta Arab. Valentina Lucks, MD   REFERRING DOCTOR:  Madaline Guthrie, MD   REASON FOR CONSULTATION:  Evaluate the patient preoperatively.  She has  a history of diastolic heart failure.   HISTORY OF PRESENT ILLNESS:  The patient is now 77 years old.  I last  saw her in 2007 for preoperative clearance.  She was having surgery for  resection of carcinoma in situ in her right breast.  She had this  followed by radiation.  She had no chemotherapy.  She had no  complications with this.  She required no preoperative testing before  that.  She had had a history of heart failure with a well-preserved  ejection fraction in 2004.  She had had a stress perfusion study  demonstrating an EF of 70% with no evidence of ischemia or infarct.  She  had an echocardiogram demonstrating normal left ventricular function,  wall motion, and thickness.   Since that evaluation, the patient has done well.  She does her  activities of daily living.  She does not exercise routinely, but she  cares for her house.  She does vacuuming.  With this level of activity,  she does not get any chest pressure, neck or arm discomfort.  She does  not have any palpitations, presyncope, or syncope.  She does not  describe any PND or orthopnea.  She does take her medications including  diuretic.  She watches her salt.  She states that the blood pressure has  been well controlled.   She now has uterine cancer and is being considered for hysterectomy.  This may be laparoscopic, but she is not sure at this point.   PAST MEDICAL HISTORY:  1. Hypertension.  2. 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  3. Newly diagnosed uterine  cancer.  4. Heart failure, well-preserved ejection fraction.  5. Dyslipidemia.   PAST SURGICAL HISTORY:  Breast surgery as described and cyst removed  from her breast.   ALLERGIES:  None.   MEDICATIONS:  1. Caduet 10/40 daily.  2. Fexofenadine 180 mg daily.  3. Hydrochlorothiazide 25 mg daily.  4. Klor-Con 20 mEq daily.  5. Tamoxifen 20 mg daily.  6. Aspirin 81 mg daily.  7. Calcium.  8. Multivitamin.  9. Fluocinonide cream.  10.Clobetasol cream.   SOCIAL HISTORY:  The patient is retired from [employer].  She is  married.  She has no children.  She quit smoking in college.  She rarely  drinks alcohol.   FAMILY HISTORY:  Noncontributory for early coronary artery disease.   REVIEW OF SYSTEMS:  As stated in the HPI, and otherwise negative for all  other systems.   PHYSICAL EXAMINATION:  GENERAL:  The patient is pleasant and in no  distress.  VITAL  SIGNS:  Blood pressure 138/62, heart rate 68 and regular, and  weight 181 pounds.  HEENT:  Eyelids unremarkable; pupils equal, round, and react to light;  fundi not visualized, oral mucosa unremarkable.  NECK:  No jugular venous distention at 45 degrees; carotid upstroke  brisk and symmetric; no bruits, no thyromegaly.  LYMPHATICS:  No cervical, axillary, or inguinal adenopathy.  LUNGS:  Clear to auscultation bilaterally.  BACK:  No costovertebral angle tenderness.  CHEST:  Unremarkable.  HEART:  PMI not displaced or sustained; S1 and S2 within normal limits;  no S3, no S4; no clicks, no rubs, no murmurs.  ABDOMEN:  Flat, positive  bowel sounds; normal in frequency and pitch; no rebound, no guarding;  positive midline pulsatile mass with slightly prominent aorta;  questionable bruit; no hepatomegaly, splenomegaly.  SKIN:  No rashes.  No nodules.  EXTREMITIES:  Pulses 2+ throughout; no edema, no cyanosis, no clubbing.  NEURO:  Oriented to person, place, and time; cranial nerves II through  XII grossly intact, motor grossly  intact.   EKG; sinus rhythm, rate 98, right bundle-branch block, QT prolongation,  nonspecific inferior ST segment changes.   ASSESSMENT AND PLAN:  1. Heart failure, well-preserved ejection fraction.  The patient is      not having any symptoms.  She is euvolemic on exam.  She controls      this with blood pressure control, salt and fluid restriction, and      diuretics.  At this point, I would like to check another      echocardiogram to make sure there has been no change in left      ventricular systolic function or evidence of progressive diastolic      dysfunction.  If this is unchanged, then I would suggest no further      cardiovascular testing and she would be cleared from a      cardiovascular standpoint for surgery.  Preoperative clearance as      above. The patient does not have any chest discomfort or      cardiovascular risk factors to indicate obstructive coronary artery      disease.  Therefore, according to ACC/AHI guidelines, no further      stress perfusion imaging or other stress testing is indicated.  2. QT prolongation.  This is a new finding.  I will review her meds.      I will give her some information on this abnormality.  She is to      avoid QT-prolonging drugs.  She should consult with her pharmacist      when she gets a new prescription.  3. Hypertension.  Again, her blood pressure is well controlled and she      will continue the meds as listed.  4. Breast cancer.  The patient remains on tamoxifen and is in      remission.  5. Midline pulsatile mass.  The patient does have a prominent aorta,      with a questionable slight bruit.  She will get abdominal      ultrasound when she comes back for her echocardiogram.  6. Followup will be as needed based on the results of the      echocardiogram or future complaints.  Again, a letter of clearance      will be forthcoming to Dr. Clifton James after the above testing.     Rollene Rotunda, MD, [facility]  Electronically  Signed    JH/MedQ  DD: 08/12/2008  DT: 08/13/2008  Job #: [id number]   cc:   Gretta Arab. Valentina Lucks, M.D.  Madaline Guthrie, MD

## 2010-11-26 NOTE — Op Note (Signed)
NAMEHOLLIDAY, SHEAFFER              ACCOUNT NO.:  [id number]   MEDICAL RECORD NO.:  [id number]          PATIENT TYPE:  AMB   LOCATION:  DSC                          FACILITY:  [facility]   PHYSICIAN:  Sharlet Salina T. Hoxworth, M.D.DATE OF BIRTH:  1932/12/03   DATE OF PROCEDURE:  07/12/2006  DATE OF DISCHARGE:                               OPERATIVE REPORT   PRE AND POSTOPERATIVE DIAGNOSIS:  Ductal carcinoma in situ, right breast   SURGICAL PROCEDURES:  Right axillary sentinel lymph node biopsy and  needle localized right breast lumpectomy.   SURGEON:  Lorne Skeens. Hoxworth, M.D.   ANESTHESIA:  General.   BRIEF HISTORY:  Margaret Mann is a 77 year old female who on recent  mammogram was found to have an increasing small cluster of  microcalcifications in the retroareolar area of the right breast.  Large  core needle biopsy has revealed DCIS.  Further workup with MRI also  showed some apparent adenopathy in the right axilla.  With this  collection of findings, we have recommended proceeding with the right  axillary sentinel lymph node biopsy and needle localized right breast  lumpectomy.  Ashby Dawes of procedure, indications, risks of bleeding,  infection and possible need for further surgery based on operative  findings were discussed and understood.  Now brought to operating room  for this procedure.   DESCRIPTION OF OPERATION:  The patient was brought to the operating  room, placed in supine position on the table and general orotracheal  anesthesia was induced.  She had previously undergone needle  localization of the calcifications as well as injection of 1 mCi of  technetium sulfur colloid prior to the procedure.  Under sterile  technique, 10 mL of saline methylene blue was injected subareolarly and  massaged for several minutes.  The entire breast and axilla were widely  sterilely prepped and draped.  The NeoProbe was used to localize a  definite hot area in the right axilla.  A small  transverse scission was  made and dissection carried down through subcutaneous tissue using  cautery.  The clavipectoral fascia was opened and there was seen to be a  bright blue lymphatic leading to a somewhat enlarged bright blue lymph  node.  This had very high counts.  This node was completely excised with  cautery.  Ex vivo the node had counts in excess of 1000 with background  in the axilla no more than 10 or 15.  This was sent for touch prep of  hot blue right axillary sentinel lymph node.  While waiting for this  report, I proceeded with the lumpectomy.  Curvilinear incision was made  just above the areolar edge just below the wire insertion point.  Dissection was carried down to the subcutaneous tissue and the wire was  brought into the incision.  The calcifications were just about a  centimeter behind the nipple.  A generous specimen of tissue was excised  around the shaft and tip of the wire taking the specimen superficially  right behind the nipple areolar complex.  The specimen was excised with  cautery.  It was oriented with sutures.  Specimen mammography confirmed  removal of the wire and previously placed marking clip.  Hemostasis  obtained in this wound with cautery.  Both incisions were infiltrated  with Marcaine.  The sentinel node touch prep returned as negative for  malignant cells.  Each incision was  closed with interrupted subcutaneous 4-0 Monocryl and running  subcuticular 4-0 Monocryl and Steri-Strips.  Sponge, needle and  instrument counts were correct.  Dry sterile dressings were applied and  the patient taken recovery in good condition.      Lorne Skeens. Hoxworth, M.D.  Electronically Signed     BTH/MEDQ  D:  07/12/2006  T:  07/12/2006  Job:  [id number]

## 2010-12-08 ENCOUNTER — Encounter (HOSPITAL_BASED_OUTPATIENT_CLINIC_OR_DEPARTMENT_OTHER): Payer: Medicare Other | Admitting: Oncology

## 2010-12-08 ENCOUNTER — Other Ambulatory Visit: Payer: Self-pay | Admitting: Oncology

## 2010-12-08 DIAGNOSIS — Z170 Estrogen receptor positive status [ER+]: Secondary | ICD-10-CM

## 2010-12-08 DIAGNOSIS — D0590 Unspecified type of carcinoma in situ of unspecified breast: Secondary | ICD-10-CM

## 2010-12-08 LAB — CBC WITH DIFFERENTIAL/PLATELET
Basophils Absolute: 0.3 10*3/uL — ABNORMAL HIGH (ref 0.0–0.1)
Eosinophils Absolute: 0.1 10*3/uL (ref 0.0–0.5)
HCT: 36.4 % (ref 34.8–46.6)
HGB: 12.4 g/dL (ref 11.6–15.9)
LYMPH%: 68.7 % — ABNORMAL HIGH (ref 14.0–49.7)
MONO#: 0.9 10*3/uL (ref 0.1–0.9)
NEUT#: 5 10*3/uL (ref 1.5–6.5)
Platelets: 226 10*3/uL (ref 145–400)
RBC: 4.05 10*6/uL (ref 3.70–5.45)
WBC: 19.6 10*3/uL — ABNORMAL HIGH (ref 3.9–10.3)

## 2010-12-08 LAB — TECHNOLOGIST REVIEW

## 2010-12-09 LAB — COMPREHENSIVE METABOLIC PANEL
ALT: <8 U/L (ref 0–35)
CO2: 26 mEq/L (ref 19–32)
Calcium: 9.4 mg/dL (ref 8.4–10.5)
Chloride: 103 mEq/L (ref 96–112)
Creatinine, Ser: 0.83 mg/dL (ref 0.40–1.20)
Glucose, Bld: 140 mg/dL — ABNORMAL HIGH (ref 70–99)
Total Protein: 6.3 g/dL (ref 6.0–8.3)

## 2010-12-09 LAB — LACTATE DEHYDROGENASE: LDH: 130 U/L (ref 94–250)

## 2010-12-15 ENCOUNTER — Other Ambulatory Visit: Payer: Self-pay | Admitting: Oncology

## 2010-12-15 ENCOUNTER — Encounter (HOSPITAL_BASED_OUTPATIENT_CLINIC_OR_DEPARTMENT_OTHER): Payer: Medicare Other | Admitting: Oncology

## 2010-12-15 DIAGNOSIS — Z856 Personal history of leukemia: Secondary | ICD-10-CM

## 2010-12-15 DIAGNOSIS — D0590 Unspecified type of carcinoma in situ of unspecified breast: Secondary | ICD-10-CM

## 2010-12-15 DIAGNOSIS — Z8542 Personal history of malignant neoplasm of other parts of uterus: Secondary | ICD-10-CM

## 2010-12-15 DIAGNOSIS — Z9889 Other specified postprocedural states: Secondary | ICD-10-CM

## 2010-12-15 DIAGNOSIS — Z853 Personal history of malignant neoplasm of breast: Secondary | ICD-10-CM

## 2011-02-21 ENCOUNTER — Ambulatory Visit
Admission: RE | Admit: 2011-02-21 | Discharge: 2011-02-21 | Disposition: A | Discharge status: Home / Self Care | Payer: Medicare Other | Source: Ambulatory Visit | Attending: Oncology | Admitting: Oncology

## 2011-02-21 DIAGNOSIS — Z853 Personal history of malignant neoplasm of breast: Secondary | ICD-10-CM

## 2011-11-15 ENCOUNTER — Telehealth: Payer: Self-pay | Admitting: *Deleted

## 2011-11-15 NOTE — Telephone Encounter (Signed)
patient called in and was given her [place] appointment with the lab being one week before the visist

## 2011-12-21 ENCOUNTER — Other Ambulatory Visit: Payer: Self-pay | Admitting: Medical Oncology

## 2011-12-21 DIAGNOSIS — C50919 Malignant neoplasm of unspecified site of unspecified female breast: Secondary | ICD-10-CM

## 2011-12-21 DIAGNOSIS — C801 Malignant (primary) neoplasm, unspecified: Secondary | ICD-10-CM

## 2011-12-21 MED ORDER — TAMOXIFEN CITRATE 20 MG PO TABS: 20.0000 mg | ORAL_TABLET | Freq: Every day | ORAL | Status: AC

## 2011-12-21 NOTE — Telephone Encounter (Signed)
[facility] 

## 2012-01-11 ENCOUNTER — Ambulatory Visit: Payer: Medicare Other | Admitting: Oncology

## 2012-01-18 ENCOUNTER — Other Ambulatory Visit (HOSPITAL_BASED_OUTPATIENT_CLINIC_OR_DEPARTMENT_OTHER): Payer: Medicare Other | Admitting: Lab

## 2012-01-18 DIAGNOSIS — D7289 Other specified disorders of white blood cells: Secondary | ICD-10-CM

## 2012-01-18 DIAGNOSIS — D0590 Unspecified type of carcinoma in situ of unspecified breast: Secondary | ICD-10-CM

## 2012-01-18 DIAGNOSIS — Z856 Personal history of leukemia: Secondary | ICD-10-CM

## 2012-01-18 DIAGNOSIS — Z8542 Personal history of malignant neoplasm of other parts of uterus: Secondary | ICD-10-CM

## 2012-01-18 LAB — CBC WITH DIFFERENTIAL/PLATELET
Basophils Absolute: 0.3 10*3/uL — ABNORMAL HIGH (ref 0.0–0.1)
Eosinophils Absolute: 0.3 10*3/uL (ref 0.0–0.5)
HGB: 11.3 g/dL — ABNORMAL LOW (ref 11.6–15.9)
MCV: 95.4 fL (ref 79.5–101.0)
MONO%: 2.2 % (ref 0.0–14.0)
NEUT#: 4.3 10*3/uL (ref 1.5–6.5)
Platelets: 195 10*3/uL (ref 145–400)
RDW: 14.8 % — ABNORMAL HIGH (ref 11.2–14.5)

## 2012-01-19 LAB — COMPREHENSIVE METABOLIC PANEL
Albumin: 3.7 g/dL (ref 3.5–5.2)
CO2: 30 mEq/L (ref 19–32)
Calcium: 9.3 mg/dL (ref 8.4–10.5)
Chloride: 105 mEq/L (ref 96–112)
Glucose, Bld: 89 mg/dL (ref 70–99)
Sodium: 141 mEq/L (ref 135–145)
Total Bilirubin: 0.5 mg/dL (ref 0.3–1.2)
Total Protein: 5.8 g/dL — ABNORMAL LOW (ref 6.0–8.3)

## 2012-01-19 LAB — VITAMIN D 25 HYDROXY (VIT D DEFICIENCY, FRACTURES): Vit D, 25-Hydroxy: 40 ng/mL (ref 30–89)

## 2012-01-25 ENCOUNTER — Telehealth: Payer: Self-pay | Admitting: *Deleted

## 2012-01-25 ENCOUNTER — Ambulatory Visit (HOSPITAL_BASED_OUTPATIENT_CLINIC_OR_DEPARTMENT_OTHER): Payer: Medicare Other | Admitting: Oncology

## 2012-01-25 VITALS — BP 111/64 | HR 80 | Temp 98.0°F | Ht 64.0 in | Wt 142.6 lb

## 2012-01-25 DIAGNOSIS — E559 Vitamin D deficiency, unspecified: Secondary | ICD-10-CM

## 2012-01-25 DIAGNOSIS — D649 Anemia, unspecified: Secondary | ICD-10-CM

## 2012-01-25 DIAGNOSIS — Z853 Personal history of malignant neoplasm of breast: Secondary | ICD-10-CM

## 2012-01-25 DIAGNOSIS — D72829 Elevated white blood cell count, unspecified: Secondary | ICD-10-CM

## 2012-01-25 DIAGNOSIS — C9110 Chronic lymphocytic leukemia of B-cell type not having achieved remission: Secondary | ICD-10-CM

## 2012-01-25 NOTE — Patient Instructions (Addendum)
Take additional 2000 units of vitamin D3 per day We would like to do a bone marrow tap in the next few weeks

## 2012-01-25 NOTE — Telephone Encounter (Signed)
Gave patient appointment for 03-02-2012 starting at 10:00am printed out calendar and gave to the patient

## 2012-01-25 NOTE — Progress Notes (Signed)
Hematology and Oncology Follow Up Visit  Margaret Mann [id number] [birth year] 78 y.o. 01/25/2012 2:33 PM   DIAGNOSIS:  PROBLEM LIST:   1. 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   2. History of stage IA endometrial cancer status post TAH and BSO on 09/02/2005 status post adjuvant chemotherapy and brachytherapy.  Completed treatments July 2010. 3. History of CLL on observation.   PAST THERAPY:  As above  Interim History:  Patient returns for followup. Her husband died in the past year. She is coping and the conduit made adjustments in her personal life. She has no new complaints. She continues that I can follow for her endometrial cancer at [facility]. She is due for a followup CAT scan of the there in the next few months  Medications: I have reviewed the patient's current medications.  Allergies: No Known Allergies  Past Medical History, Surgical history, Social history, and Family History were reviewed and updated.  Review of Systems: Constitutional:  Negative for fever, chills, night sweats, anorexia, weight loss, pain. Cardiovascular: no chest pain or dyspnea on exertion Respiratory: no cough, shortness of breath, or wheezing Neurological: negative Dermatological: negative ENT: negative Skin Gastrointestinal: negative Genito-Urinary: negative Hematological and Lymphatic: negative Breast: negative Musculoskeletal: negative Remaining ROS negative.  Physical Exam:  Blood pressure 111/64, pulse 80, temperature 98 F (36.7 C), height 5\' 4"  (1.626 m), weight 142 lb 9.6 oz (64.683 kg).  ECOG: 0 HEENT:  Sclerae anicteric, conjunctivae pink.  Oropharynx clear.  No mucositis or candidiasis.  Nodes:  No cervical, supraclavicular, or axillary lymphadenopathy palpated.  Breast Exam:  Right breast is benign.  No masses, discharge, skin change, or nipple inversion.  Left breast is  benign.  No masses, discharge, skin change, or nipple inversion..  Lungs:  Clear to auscultation bilaterally.  No crackles, rhonchi, or wheezes.  Heart:  Regular rate and rhythm.  Abdomen:  Soft, nontender.  Positive bowel sounds.  No organomegaly or masses palpated.  Musculoskeletal:  No focal spinal tenderness to palpation.  Extremities:  Benign.  No peripheral edema or cyanosis.  Skin:  Benign.  Neuro:  Nonfocal.      Lab Results: Lab Results  Component Value Date   WBC 51.9* 01/18/2012   HGB 11.3* 01/18/2012   HCT 35.8 01/18/2012   MCV 95.4 01/18/2012   PLT 195 01/18/2012     Chemistry      Component Value Date/Time   NA 141 01/18/2012 1106   K 4.3 01/18/2012 1106   CL 105 01/18/2012 1106   CO2 30 01/18/2012 1106   BUN 20 01/18/2012 1106   CREATININE 0.91 01/18/2012 1106      Component Value Date/Time   CALCIUM 9.3 01/18/2012 1106   ALKPHOS 42 01/18/2012 1106   AST 23 01/18/2012 1106   ALT 12 01/18/2012 1106   BILITOT 0.5 01/18/2012 1106       Radiological Studies:  No results found.   IMPRESSIONS AND PLAN: A 78 y.o. female with   History of DCIS having completed 5 years of tamoxifen. I recommended we stop that at this point. History of CLL, stage 0 on observation since diagnosis was made. Currently white count has elevated. There is no other ancillary reasons for this i.e. taking prednisone. Her hemoglobin and platelet count have also decreased somewhat. I am little concerned about this. I recommended we followed with a bone marrow biopsy to do cytogenetics in the next  week or so for repeat blood count as well as other blood work. Her clinical exam is negative for adenopathy and we will look at her CT scan when she has at the next few weeks to see what she does have any image evidence for lymphoma. Of note is that on initial diagnosis or zap 70 was negative. Cytogenetics currently performed may be more predictive of her disease course. I plan followup in 4-6 weeks after bone  marrow.  Spent more than half the time coordinating care, as well as discussion of BMI and its implications.      Margaret Mann 7/17/20132:33 PM Cell [phone]

## 2012-01-31 ENCOUNTER — Other Ambulatory Visit: Payer: Medicare Other | Admitting: Lab

## 2012-01-31 ENCOUNTER — Ambulatory Visit (HOSPITAL_BASED_OUTPATIENT_CLINIC_OR_DEPARTMENT_OTHER): Payer: Medicare Other | Admitting: Oncology

## 2012-01-31 ENCOUNTER — Other Ambulatory Visit (HOSPITAL_COMMUNITY)
Admission: RE | Admit: 2012-01-31 | Discharge: 2012-01-31 | Disposition: A | Discharge status: Home / Self Care | Payer: Medicare Other | Source: Ambulatory Visit | Attending: Oncology | Admitting: Oncology

## 2012-01-31 VITALS — BP 113/61 | HR 68 | Temp 97.7°F

## 2012-01-31 DIAGNOSIS — C9110 Chronic lymphocytic leukemia of B-cell type not having achieved remission: Secondary | ICD-10-CM

## 2012-01-31 DIAGNOSIS — C8599 Non-Hodgkin lymphoma, unspecified, extranodal and solid organ sites: Secondary | ICD-10-CM | POA: Insufficient documentation

## 2012-01-31 LAB — CBC
HCT: 34.3 % — ABNORMAL LOW (ref 36.0–46.0)
MCV: 94.2 fL (ref 78.0–100.0)
RBC: 3.64 MIL/uL — ABNORMAL LOW (ref 3.87–5.11)
WBC: 69.3 10*3/uL (ref 4.0–10.5)

## 2012-01-31 LAB — DIFFERENTIAL
Band Neutrophils: 0 % (ref 0–10)
Eosinophils Absolute: 0 10*3/uL (ref 0.0–0.7)
Eosinophils Relative: 0 % (ref 0–5)
Lymphocytes Relative: 82 % — ABNORMAL HIGH (ref 12–46)
Lymphs Abs: 56.8 10*3/uL — ABNORMAL HIGH (ref 0.7–4.0)
Metamyelocytes Relative: 0 %
Monocytes Absolute: 4.2 10*3/uL — ABNORMAL HIGH (ref 0.1–1.0)
Monocytes Relative: 6 % (ref 3–12)
nRBC: 0 /100 WBC

## 2012-01-31 NOTE — Progress Notes (Signed)
Bone Marrow Biopsy and Aspiration Procedure Note   Informed consent was obtained and potential risks including bleeding, infection and pain were reviewed with the patient. A time out procedure was held.  Posterior iliac crest(s) prepped with Betadine.   Lidocaine 2% local anesthesia infiltrated into the subcutaneous tissue.  Left bone marrow biopsy and left bone marrow aspirate was obtained.   The procedure was tolerated well and there were no complications.  Specimens sent for: routine histopathologic stains and sectioning and cytogenetics   Pierce Crane, MD [facility] Medical/Oncology [facility] [phone] (beeper) [phone] (Office)  01/31/2012, 8:53 AM

## 2012-02-01 ENCOUNTER — Other Ambulatory Visit: Payer: Self-pay | Admitting: Oncology

## 2012-02-01 DIAGNOSIS — Z853 Personal history of malignant neoplasm of breast: Secondary | ICD-10-CM

## 2012-02-08 ENCOUNTER — Encounter: Payer: Self-pay | Admitting: Oncology

## 2012-02-09 ENCOUNTER — Encounter: Payer: Self-pay | Admitting: *Deleted

## 2012-02-09 NOTE — Progress Notes (Signed)
Mailed after appt letter to pt. 

## 2012-02-19 ENCOUNTER — Other Ambulatory Visit: Payer: Self-pay | Admitting: Physician Assistant

## 2012-02-19 DIAGNOSIS — D0511 Intraductal carcinoma in situ of right breast: Secondary | ICD-10-CM

## 2012-02-20 ENCOUNTER — Other Ambulatory Visit: Payer: Self-pay | Admitting: Emergency Medicine

## 2012-02-22 ENCOUNTER — Ambulatory Visit
Admission: RE | Admit: 2012-02-22 | Discharge: 2012-02-22 | Disposition: A | Discharge status: Home / Self Care | Payer: Medicare Other | Source: Ambulatory Visit | Attending: Oncology | Admitting: Oncology

## 2012-02-22 DIAGNOSIS — Z853 Personal history of malignant neoplasm of breast: Secondary | ICD-10-CM

## 2012-03-02 ENCOUNTER — Ambulatory Visit (HOSPITAL_BASED_OUTPATIENT_CLINIC_OR_DEPARTMENT_OTHER): Payer: Medicare Other | Admitting: Oncology

## 2012-03-02 ENCOUNTER — Telehealth: Payer: Self-pay | Admitting: Oncology

## 2012-03-02 ENCOUNTER — Other Ambulatory Visit (HOSPITAL_BASED_OUTPATIENT_CLINIC_OR_DEPARTMENT_OTHER): Payer: Medicare Other | Admitting: Lab

## 2012-03-02 ENCOUNTER — Encounter: Payer: Self-pay | Admitting: *Deleted

## 2012-03-02 VITALS — BP 107/62 | HR 82 | Temp 97.9°F | Resp 20 | Ht 64.0 in | Wt 142.2 lb

## 2012-03-02 DIAGNOSIS — E559 Vitamin D deficiency, unspecified: Secondary | ICD-10-CM

## 2012-03-02 DIAGNOSIS — Z853 Personal history of malignant neoplasm of breast: Secondary | ICD-10-CM

## 2012-03-02 DIAGNOSIS — C9110 Chronic lymphocytic leukemia of B-cell type not having achieved remission: Secondary | ICD-10-CM

## 2012-03-02 LAB — CBC WITH DIFFERENTIAL/PLATELET
BASO%: 0.3 % (ref 0.0–2.0)
Eosinophils Absolute: 0.2 10*3/uL (ref 0.0–0.5)
MCHC: 32.2 g/dL (ref 31.5–36.0)
MCV: 96.6 fL (ref 79.5–101.0)
MONO#: 1.3 10*3/uL — ABNORMAL HIGH (ref 0.1–0.9)
MONO%: 1.6 % (ref 0.0–14.0)
NEUT#: 5.6 10*3/uL (ref 1.5–6.5)
RBC: 3.7 10*6/uL (ref 3.70–5.45)
RDW: 14.7 % — ABNORMAL HIGH (ref 11.2–14.5)
WBC: 81.6 10*3/uL (ref 3.9–10.3)

## 2012-03-02 LAB — TECHNOLOGIST REVIEW

## 2012-03-02 NOTE — Telephone Encounter (Signed)
gve the pt her pet scan appt along with the sept schedule

## 2012-03-04 NOTE — Progress Notes (Signed)
Hematology and Oncology Follow Up Visit  Margaret Mann [id number] 1933/05/20 78 y.o. 03/04/2012 7:17 PM   DIAGNOSIS:  PROBLEM LIST:   1. 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   2. History of stage IA endometrial cancer status post TAH and BSO on 09/02/2005 status post adjuvant chemotherapy and brachytherapy.  Completed treatments July 2010. 3. History of CLL on observation. Status post recent bone marrow biopsy   PAST THERAPY:  As above  Interim History:  Patient returns for followup. Patient is here for followup to discuss her bone marrow results. Essentially this showed diffuse involvement with CLL. Surface Ms. chemistry was consistent with CLL. 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 This does portend for poor prognosis. She feels otherwise well. Medications: I have reviewed the patient's current medications.  Allergies: No Known Allergies  Past Medical History, Surgical history, Social history, and Family History were reviewed and updated.  Review of Systems: Constitutional:  Negative for fever, chills, night sweats, anorexia, weight loss, pain. Cardiovascular: no chest pain or dyspnea on exertion Respiratory: no cough, shortness of breath, or wheezing Neurological: negative Dermatological: negative ENT: negative Skin Gastrointestinal: negative Genito-Urinary: negative Hematological and Lymphatic: negative Breast: negative Musculoskeletal: negative Remaining ROS negative.  Physical Exam:  Blood pressure 107/62, pulse 82, temperature 97.9 F (36.6 C), temperature source Oral, resp. rate 20, height 5\' 4"  (1.626 m), weight 142 lb 3.2 oz (64.501 kg).  ECOG: 0 HEENT:  Sclerae anicteric, conjunctivae pink.  Oropharynx clear.  No mucositis or candidiasis.  Nodes:  No cervical, supraclavicular, or axillary lymphadenopathy palpated.  Breast  Exam:  Right breast is benign.  No masses, discharge, skin change, or nipple inversion.  Left breast is benign.  No masses, discharge, skin change, or nipple inversion..  Lungs:  Clear to auscultation bilaterally.  No crackles, rhonchi, or wheezes.  Heart:  Regular rate and rhythm.  Abdomen:  Soft, nontender.  Positive bowel sounds.  No organomegaly or masses palpated.  Musculoskeletal:  No focal spinal tenderness to palpation.  Extremities:  Benign.  No peripheral edema or cyanosis.  Skin:  Benign.  Neuro:  Nonfocal.      Lab Results: Lab Results  Component Value Date   WBC 81.6* 03/02/2012   HGB 11.5* 03/02/2012   HCT 35.7 03/02/2012   MCV 96.6 03/02/2012   PLT 212 03/02/2012     Chemistry      Component Value Date/Time   NA 141 03/02/2012 0938   K 4.3 03/02/2012 0938   CL 104 03/02/2012 0938   CO2 25 03/02/2012 0938   BUN 24* 03/02/2012 0938   CREATININE 0.89 03/02/2012 0938      Component Value Date/Time   CALCIUM 9.6 03/02/2012 0938   ALKPHOS 55 03/02/2012 0938   AST 26 03/02/2012 0938   ALT 12 03/02/2012 0938   BILITOT 0.5 03/02/2012 7829       Radiological Studies:  No results found.   IMPRESSIONS AND PLAN: A 78 y.o. female with   History of DCIS having completed 5 years of tamoxifen. I recommended we stop that at this point. History of CLL, stage 0 on observation since diagnosis was made.  My discussion today with her related to the pathology of recently obtained. Her current white count is over 80,000 has really quite dramatically increased. Reticulocyte counts are relatively stable. Information I did discuss with her possible enrollment on  a clinical research study that looks at the use of Campath and with Rituxan with different doses. She does present time has not had any eligible because of her stable hemoglobin. So we've decided to go ahead and check her counts under fairly frequent basis. She systemically she feels well she has no B. symptoms. Given her relatively rapid rise  in white count I suspect she will need treatment in the next few weeks. I will plan serial followup in 4-6 weeks as well. Spent more than half the time coordinating care, as well as discussion of BMI and its implications.      Margaret Mann 8/25/20137:17 PM Cell [phone]

## 2012-03-06 ENCOUNTER — Encounter (HOSPITAL_COMMUNITY)
Admission: RE | Admit: 2012-03-06 | Discharge: 2012-03-06 | Disposition: A | Discharge status: Home / Self Care | Payer: Medicare Other | Source: Ambulatory Visit | Attending: Oncology | Admitting: Oncology

## 2012-03-06 DIAGNOSIS — R599 Enlarged lymph nodes, unspecified: Secondary | ICD-10-CM | POA: Insufficient documentation

## 2012-03-06 DIAGNOSIS — I517 Cardiomegaly: Secondary | ICD-10-CM | POA: Insufficient documentation

## 2012-03-06 DIAGNOSIS — K449 Diaphragmatic hernia without obstruction or gangrene: Secondary | ICD-10-CM | POA: Insufficient documentation

## 2012-03-06 DIAGNOSIS — E559 Vitamin D deficiency, unspecified: Secondary | ICD-10-CM

## 2012-03-06 DIAGNOSIS — C9110 Chronic lymphocytic leukemia of B-cell type not having achieved remission: Secondary | ICD-10-CM | POA: Insufficient documentation

## 2012-03-06 DIAGNOSIS — Z90710 Acquired absence of both cervix and uterus: Secondary | ICD-10-CM | POA: Insufficient documentation

## 2012-03-06 DIAGNOSIS — K8020 Calculus of gallbladder without cholecystitis without obstruction: Secondary | ICD-10-CM | POA: Insufficient documentation

## 2012-03-06 LAB — COMPREHENSIVE METABOLIC PANEL
ALT: 12 U/L (ref 0–35)
Albumin: 4 g/dL (ref 3.5–5.2)
Alkaline Phosphatase: 55 U/L (ref 39–117)
Glucose, Bld: 95 mg/dL (ref 70–99)
Potassium: 4.3 mEq/L (ref 3.5–5.3)
Sodium: 141 mEq/L (ref 135–145)
Total Bilirubin: 0.5 mg/dL (ref 0.3–1.2)
Total Protein: 6.7 g/dL (ref 6.0–8.3)

## 2012-03-06 LAB — PROTEIN ELECTROPHORESIS, SERUM
Alpha-1-Globulin: 5.9 % — ABNORMAL HIGH (ref 2.9–4.9)
Alpha-2-Globulin: 13.1 % — ABNORMAL HIGH (ref 7.1–11.8)
Beta 2: 3.9 % (ref 3.2–6.5)
Beta Globulin: 6.3 % (ref 4.7–7.2)
Gamma Globulin: 11.8 % (ref 11.1–18.8)

## 2012-03-06 LAB — BETA 2 MICROGLOBULIN, SERUM: Beta-2 Microglobulin: 4.03 mg/L — ABNORMAL HIGH (ref 1.01–1.73)

## 2012-03-06 MED ORDER — FLUDEOXYGLUCOSE F - 18 (FDG) INJECTION
18.5000 | Freq: Once | INTRAVENOUS | Status: AC | PRN
  Administered 2012-03-06: 18.5 via INTRAVENOUS

## 2012-03-13 ENCOUNTER — Telehealth: Payer: Self-pay | Admitting: *Deleted

## 2012-03-13 NOTE — Telephone Encounter (Signed)
Per,MD ok for flu shot

## 2012-03-19 ENCOUNTER — Other Ambulatory Visit (HOSPITAL_BASED_OUTPATIENT_CLINIC_OR_DEPARTMENT_OTHER): Payer: Medicare Other | Admitting: Lab

## 2012-03-19 ENCOUNTER — Telehealth: Payer: Self-pay | Admitting: Oncology

## 2012-03-19 ENCOUNTER — Ambulatory Visit (HOSPITAL_BASED_OUTPATIENT_CLINIC_OR_DEPARTMENT_OTHER): Payer: Medicare Other | Admitting: Oncology

## 2012-03-19 VITALS — BP 108/63 | HR 91 | Temp 98.2°F | Resp 20 | Ht 64.0 in | Wt 143.2 lb

## 2012-03-19 DIAGNOSIS — Z856 Personal history of leukemia: Secondary | ICD-10-CM

## 2012-03-19 DIAGNOSIS — Z8542 Personal history of malignant neoplasm of other parts of uterus: Secondary | ICD-10-CM

## 2012-03-19 DIAGNOSIS — C55 Malignant neoplasm of uterus, part unspecified: Secondary | ICD-10-CM

## 2012-03-19 DIAGNOSIS — C9110 Chronic lymphocytic leukemia of B-cell type not having achieved remission: Secondary | ICD-10-CM

## 2012-03-19 DIAGNOSIS — E559 Vitamin D deficiency, unspecified: Secondary | ICD-10-CM

## 2012-03-19 DIAGNOSIS — D0590 Unspecified type of carcinoma in situ of unspecified breast: Secondary | ICD-10-CM

## 2012-03-19 LAB — CBC WITH DIFFERENTIAL/PLATELET
Basophils Absolute: 0.5 10*3/uL — ABNORMAL HIGH (ref 0.0–0.1)
EOS%: 0.3 % (ref 0.0–7.0)
Eosinophils Absolute: 0.2 10*3/uL (ref 0.0–0.5)
HCT: 34.8 % (ref 34.8–46.6)
HGB: 11 g/dL — ABNORMAL LOW (ref 11.6–15.9)
LYMPH%: 91.6 % — ABNORMAL HIGH (ref 14.0–49.7)
MCH: 30.6 pg (ref 25.1–34.0)
MCV: 97.2 fL (ref 79.5–101.0)
MONO%: 1.5 % (ref 0.0–14.0)
NEUT#: 4.9 10*3/uL (ref 1.5–6.5)
NEUT%: 6 % — ABNORMAL LOW (ref 38.4–76.8)
Platelets: 211 10*3/uL (ref 145–400)
RDW: 15 % — ABNORMAL HIGH (ref 11.2–14.5)

## 2012-03-19 LAB — COMPREHENSIVE METABOLIC PANEL (CC13)
Albumin: 3.6 g/dL (ref 3.5–5.0)
Alkaline Phosphatase: 64 U/L (ref 40–150)
BUN: 22 mg/dL (ref 7.0–26.0)
Creatinine: 1.1 mg/dL (ref 0.6–1.1)
Glucose: 172 mg/dl — ABNORMAL HIGH (ref 70–99)
Total Bilirubin: 0.6 mg/dL (ref 0.20–1.20)

## 2012-03-19 LAB — TECHNOLOGIST REVIEW

## 2012-03-19 NOTE — Telephone Encounter (Signed)
gv pt appt schedule for October. Per PR pt to f/u in one month and ok to book on 10/10 @ 12:30 pm

## 2012-03-19 NOTE — Progress Notes (Signed)
Hematology and Oncology Follow Up Visit  Margaret Mann [id number] 1933/03/07 78 y.o. 03/20/2012 8:45 AM   DIAGNOSIS:  PROBLEM LIST:   1. 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   2. History of stage IA endometrial cancer status post TAH and BSO on 09/02/2005 status post adjuvant chemotherapy and brachytherapy.  Completed treatments July 2010. 3. History of CLL on observation. Status post recent bone marrow biopsy   PAST THERAPY:  As above  Interim History:  Patient returns for followup. Patient is here for followup to discuss her bone marrow results. Essentially this showed diffuse involvement with CLL. Surface Ms. chemistry was consistent with CLL. 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 This does portend for poor prognosis. She feels otherwise well. Medications: I have reviewed the patient's current medications.  Allergies: No Known Allergies  Past Medical History, Surgical history, Social history, and Family History were reviewed and updated.  Review of Systems: Constitutional:  Negative for fever, chills, night sweats, anorexia, weight loss, pain. Cardiovascular: no chest pain or dyspnea on exertion Respiratory: no cough, shortness of breath, or wheezing Neurological: negative Dermatological: negative ENT: negative Skin Gastrointestinal: negative Genito-Urinary: negative Hematological and Lymphatic: negative Breast: negative Musculoskeletal: negative Remaining ROS negative.  Physical Exam:  Blood pressure 108/63, pulse 91, temperature 98.2 F (36.8 C), resp. rate 20, height 5\' 4"  (1.626 m), weight 143 lb 3.2 oz (64.955 kg).  ECOG: 0 HEENT:  Sclerae anicteric, conjunctivae pink.  Oropharynx clear.  No mucositis or candidiasis.  Nodes:  No cervical, supraclavicular, or axillary lymphadenopathy palpated.  Breast Exam:  Right breast is  benign.  No masses, discharge, skin change, or nipple inversion.  Left breast is benign.  No masses, discharge, skin change, or nipple inversion..  Lungs:  Clear to auscultation bilaterally.  No crackles, rhonchi, or wheezes.  Heart:  Regular rate and rhythm.  Abdomen:  Soft, nontender.  Positive bowel sounds.  No organomegaly or masses palpated.  Musculoskeletal:  No focal spinal tenderness to palpation.  Extremities:  Benign.  No peripheral edema or cyanosis.  Skin:  Benign.  Neuro:  Nonfocal.      Lab Results: Lab Results  Component Value Date   WBC 80.4* 03/19/2012   HGB 11.0* 03/19/2012   HCT 34.8 03/19/2012   MCV 97.2 03/19/2012   PLT 211 03/19/2012     Chemistry      Component Value Date/Time   NA 141 03/19/2012 1301   NA 141 03/02/2012 0938   K 3.7 03/19/2012 1301   K 4.3 03/02/2012 0938   CL 103 03/19/2012 1301   CL 104 03/02/2012 0938   CO2 29 03/19/2012 1301   CO2 25 03/02/2012 0938   BUN 22.0 03/19/2012 1301   BUN 24* 03/02/2012 0938   CREATININE 1.1 03/19/2012 1301   CREATININE 0.89 03/02/2012 0938      Component Value Date/Time   CALCIUM 9.4 03/19/2012 1301   CALCIUM 9.6 03/02/2012 0938   ALKPHOS 64 03/19/2012 1301   ALKPHOS 55 03/02/2012 0938   AST 22 03/19/2012 1301   AST 26 03/02/2012 0938   ALT 13 03/19/2012 1301   ALT 12 03/02/2012 0938   BILITOT 0.60 03/19/2012 1301   BILITOT 0.5 03/02/2012 7829       Radiological Studies:  No results found.   IMPRESSIONS AND PLAN: A 78 y.o. female with   History of DCIS having  completed 5 years of tamoxifen. I recommended we stop that at this point. History of CLL, stage 0 on observation since diagnosis was made.    Patient is here for followup. I reviewed her PET scan. This shows some minor adenopathy is seen throughout her body. He has mild FDG uptake. I will consider that the mass in the pelvis which measures about 6-8 cm. This has also some moderate FDG uptake. Given her previous history of uterine cancer I've recommended followup with her  gynecologic oncologist. This likely need to be biopsied under.with separately. It and I can see no recurrence of her uterine cancers made her white count go up as well. I plan to see her in a month's time. I've started a copy of the PET scan to her gynecological oncologist Dr. Clifton James.  Spent more than half the time coordinating care, as well as discussion of BMI and its implications.      Margaret Mann 9/10/20138:45 AM Cell [phone]

## 2012-03-19 NOTE — Telephone Encounter (Signed)
Add to previous note. Lb informed re add on and no need to send pt back.

## 2012-03-20 DIAGNOSIS — C55 Malignant neoplasm of uterus, part unspecified: Secondary | ICD-10-CM | POA: Insufficient documentation

## 2012-04-04 ENCOUNTER — Encounter: Payer: Self-pay | Admitting: *Deleted

## 2012-04-04 ENCOUNTER — Telehealth: Payer: Self-pay | Admitting: *Deleted

## 2012-04-04 NOTE — Telephone Encounter (Signed)
PLZ CANCEL 04/19/12 W/ MD AND R/S 04/26/12 AT 430 TO REVIEW PATH. FROM DR [facility]  Patient is aware of the appointment

## 2012-04-19 ENCOUNTER — Ambulatory Visit: Payer: Medicare Other | Admitting: Oncology

## 2012-04-26 ENCOUNTER — Ambulatory Visit (HOSPITAL_BASED_OUTPATIENT_CLINIC_OR_DEPARTMENT_OTHER): Payer: Medicare Other | Admitting: Oncology

## 2012-04-26 VITALS — BP 110/68 | HR 96 | Temp 98.7°F | Resp 20 | Ht 64.0 in | Wt 143.3 lb

## 2012-04-26 DIAGNOSIS — Z856 Personal history of leukemia: Secondary | ICD-10-CM

## 2012-04-26 DIAGNOSIS — C9110 Chronic lymphocytic leukemia of B-cell type not having achieved remission: Secondary | ICD-10-CM

## 2012-04-26 DIAGNOSIS — Z8542 Personal history of malignant neoplasm of other parts of uterus: Secondary | ICD-10-CM

## 2012-04-26 DIAGNOSIS — D0590 Unspecified type of carcinoma in situ of unspecified breast: Secondary | ICD-10-CM

## 2012-04-26 MED ORDER — ALLOPURINOL 300 MG PO TABS: 300.0000 mg | ORAL_TABLET | Freq: Every day | ORAL | Status: DC

## 2012-04-26 MED ORDER — LORAZEPAM 0.5 MG PO TABS: 0.5000 mg | ORAL_TABLET | Freq: Four times a day (QID) | ORAL | Status: DC | PRN

## 2012-04-26 MED ORDER — PROCHLORPERAZINE 25 MG RE SUPP: 25.0000 mg | Freq: Two times a day (BID) | RECTAL | Status: DC | PRN

## 2012-04-26 MED ORDER — ACYCLOVIR 400 MG PO TABS: 400.0000 mg | ORAL_TABLET | Freq: Every day | ORAL | Status: DC

## 2012-04-26 MED ORDER — PROCHLORPERAZINE MALEATE 10 MG PO TABS: 10.0000 mg | ORAL_TABLET | Freq: Four times a day (QID) | ORAL | Status: DC | PRN

## 2012-04-26 MED ORDER — ONDANSETRON HCL 8 MG PO TABS: 8.0000 mg | ORAL_TABLET | Freq: Two times a day (BID) | ORAL | Status: DC

## 2012-04-26 MED ORDER — DEXAMETHASONE 4 MG PO TABS: 8.0000 mg | ORAL_TABLET | Freq: Two times a day (BID) | ORAL | Status: DC

## 2012-04-26 NOTE — Patient Instructions (Signed)
   BENDAMUSTINE GIVEN 2 DAYS IN A ROW , EVERY 3 WEEKS

## 2012-04-26 NOTE — Progress Notes (Signed)
Hematology and Oncology Follow Up Visit  Margaret Mann [id number] [birth year] 78 y.o. 04/26/2012 6:38 PM   DIAGNOSIS:  PROBLEM LIST:   1. 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   2. History of stage IA endometrial cancer status post TAH and BSO on 09/02/2005 status post adjuvant chemotherapy and brachytherapy.  Completed treatments July 2010. 3. History of CLL on observation. Status post recent bone marrow biopsy   PAST THERAPY:  As above  Interim History:  Patient returns for followup. Patient is here for followup to discuss her bone marrow results. Essentially this showed diffuse involvement with CLL. Surface Ms. chemistry was consistent with CLL. 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 This does portend for poor prognosis. She feels otherwise well. After raising last week a PET scan was performed which showed a fairly large mass in the pelvis measuring about 6-7 cm. Given her history of endometrial cancer we elected to go ahead with a biopsy which was performed by her gynecologic oncologist. She informed verbally that this did represent CLL and not endometrial cancer. Unfortunately I do not have the report documenting this. The patient herself that she tolerated the biopsy well but her white count was over 95,000 at the time of biopsy which showed a further increase compared to the previous values. Medications: I have reviewed the patient's current medications.  Allergies: No Known Allergies  Past Medical History, Surgical history, Social history, and Family History were reviewed and updated.  Review of Systems: Constitutional:  Negative for fever, chills, night sweats, anorexia, weight loss, pain. Cardiovascular: no chest pain or dyspnea on exertion Respiratory: no cough, shortness of breath, or wheezing Neurological: negative Dermatological:  negative ENT: negative Skin Gastrointestinal: negative Genito-Urinary: negative Hematological and Lymphatic: negative Breast: negative Musculoskeletal: negative Remaining ROS negative.  Physical Exam:  Blood pressure 110/68, pulse 96, temperature 98.7 F (37.1 C), temperature source Oral, resp. rate 20, height 5\' 4"  (1.626 m), weight 143 lb 4.8 oz (65 kg).  ECOG: 0 HEENT:  Sclerae anicteric, conjunctivae pink.  Oropharynx clear.  No mucositis or candidiasis.  Nodes:  No cervical, supraclavicular, or axillary lymphadenopathy palpated.  Breast Exam:  Right breast is benign.  No masses, discharge, skin change, or nipple inversion.  Left breast is benign.  No masses, discharge, skin change, or nipple inversion..  Lungs:  Clear to auscultation bilaterally.  No crackles, rhonchi, or wheezes.  Heart:  Regular rate and rhythm.  Abdomen:  Soft, nontender.  Positive bowel sounds.  No organomegaly or masses palpated.  Musculoskeletal:  No focal spinal tenderness to palpation.  Extremities:  Benign.  No peripheral edema or cyanosis.  Skin:  Benign.  Neuro:  Nonfocal.      Lab Results: Lab Results  Component Value Date   WBC 80.4* 03/19/2012   HGB 11.0* 03/19/2012   HCT 34.8 03/19/2012   MCV 97.2 03/19/2012   PLT 211 03/19/2012     Chemistry      Component Value Date/Time   NA 141 03/19/2012 1301   NA 141 03/02/2012 0938   K 3.7 03/19/2012 1301   K 4.3 03/02/2012 0938   CL 103 03/19/2012 1301   CL 104 03/02/2012 0938   CO2 29 03/19/2012 1301   CO2 25 03/02/2012 0938   BUN 22.0 03/19/2012 1301   BUN 24* 03/02/2012 0938   CREATININE 1.1 03/19/2012 1301   CREATININE 0.89  03/02/2012 0938      Component Value Date/Time   CALCIUM 9.4 03/19/2012 1301   CALCIUM 9.6 03/02/2012 0938   ALKPHOS 64 03/19/2012 1301   ALKPHOS 55 03/02/2012 0938   AST 22 03/19/2012 1301   AST 26 03/02/2012 0938   ALT 13 03/19/2012 1301   ALT 12 03/02/2012 0938   BILITOT 0.60 03/19/2012 1301   BILITOT 0.5 03/02/2012 1610       Radiological  Studies:  No results found.   IMPRESSIONS AND PLAN: A 78 y.o. female with   History of DCIS having completed 5 years of tamoxifen. I recommended we stop that at this point. History of CLL, stage 0 on observation since diagnosis was made.    I spent 20 minutes today discussing the situation with the patient. As noted her white count per her report had risen to over 95,000 at time of the biopsy. We need to get was the report of that biopsy to confirm that in fact it does represent CLL or perhaps a form of lymphoma at this point. My plan would be to go ahead with treatment using treanda. And rituxan.  I went ahead and schedule her to start treatment on the 24th and 25th, I have also given her prescription for very 2 medications including acyclovir and allopurinol. She will return next week hopefully day before for chemotherapy teaching and wheeze peripheral IV access for now. Raynelle Fanning followup in the 7-10 days after treatment for nadir check.  Pierce Crane M.D. Salvatore Marvel 10/17/20136:38 PM Cell [phone]

## 2012-04-27 ENCOUNTER — Telehealth: Payer: Self-pay | Admitting: *Deleted

## 2012-04-27 ENCOUNTER — Other Ambulatory Visit: Payer: Self-pay | Admitting: *Deleted

## 2012-04-27 DIAGNOSIS — C9110 Chronic lymphocytic leukemia of B-cell type not having achieved remission: Secondary | ICD-10-CM

## 2012-04-27 NOTE — Telephone Encounter (Signed)
Made patient appointment for chemo class on 05-01-2012  Sent michelle email to set up patient for 05-04-2012

## 2012-04-27 NOTE — Telephone Encounter (Signed)
Per staff message and POF I have scheduled appts.  JMW  

## 2012-04-27 NOTE — Telephone Encounter (Signed)
05-10-2012 STARTING AT 11:00AM

## 2012-05-01 ENCOUNTER — Other Ambulatory Visit: Payer: Self-pay | Admitting: *Deleted

## 2012-05-01 ENCOUNTER — Other Ambulatory Visit: Payer: Medicare Other

## 2012-05-01 ENCOUNTER — Telehealth: Payer: Self-pay | Admitting: *Deleted

## 2012-05-01 DIAGNOSIS — D0590 Unspecified type of carcinoma in situ of unspecified breast: Secondary | ICD-10-CM

## 2012-05-01 DIAGNOSIS — C55 Malignant neoplasm of uterus, part unspecified: Secondary | ICD-10-CM

## 2012-05-01 NOTE — Telephone Encounter (Signed)
Left voice message to inform the patient of the new date and time on 05-03-2012 starting at 8:15am

## 2012-05-03 ENCOUNTER — Other Ambulatory Visit (HOSPITAL_BASED_OUTPATIENT_CLINIC_OR_DEPARTMENT_OTHER): Payer: Medicare Other | Admitting: Lab

## 2012-05-03 ENCOUNTER — Ambulatory Visit (HOSPITAL_BASED_OUTPATIENT_CLINIC_OR_DEPARTMENT_OTHER): Payer: Medicare Other

## 2012-05-03 VITALS — BP 92/56 | HR 97 | Temp 97.4°F | Resp 18

## 2012-05-03 DIAGNOSIS — C9110 Chronic lymphocytic leukemia of B-cell type not having achieved remission: Secondary | ICD-10-CM

## 2012-05-03 DIAGNOSIS — Z5112 Encounter for antineoplastic immunotherapy: Secondary | ICD-10-CM

## 2012-05-03 DIAGNOSIS — Z5111 Encounter for antineoplastic chemotherapy: Secondary | ICD-10-CM

## 2012-05-03 LAB — CBC WITH DIFFERENTIAL/PLATELET
Basophils Absolute: 0.3 10*3/uL — ABNORMAL HIGH (ref 0.0–0.1)
EOS%: 0.3 % (ref 0.0–7.0)
MCH: 30.4 pg (ref 25.1–34.0)
MCV: 98.1 fL (ref 79.5–101.0)
MONO%: 2.3 % (ref 0.0–14.0)
RBC: 3.52 10*6/uL — ABNORMAL LOW (ref 3.70–5.45)
RDW: 15.5 % — ABNORMAL HIGH (ref 11.2–14.5)

## 2012-05-03 LAB — COMPREHENSIVE METABOLIC PANEL (CC13)
AST: 27 U/L (ref 5–34)
Albumin: 3.6 g/dL (ref 3.5–5.0)
Alkaline Phosphatase: 79 U/L (ref 40–150)
BUN: 19 mg/dL (ref 7.0–26.0)
Potassium: 4.2 mEq/L (ref 3.5–5.1)
Sodium: 141 mEq/L (ref 136–145)

## 2012-05-03 LAB — TECHNOLOGIST REVIEW

## 2012-05-03 MED ORDER — DIPHENHYDRAMINE HCL 25 MG PO CAPS
50.0000 mg | ORAL_CAPSULE | Freq: Once | ORAL | Status: AC
  Administered 2012-05-03: 50 mg via ORAL

## 2012-05-03 MED ORDER — SODIUM CHLORIDE 0.9 % IV SOLN
90.0000 mg/m2 | Freq: Once | INTRAVENOUS | Status: AC
  Administered 2012-05-03: 155 mg via INTRAVENOUS
  Filled 2012-05-03: qty 31

## 2012-05-03 MED ORDER — ACETAMINOPHEN 325 MG PO TABS
650.0000 mg | ORAL_TABLET | Freq: Once | ORAL | Status: AC
  Administered 2012-05-03: 650 mg via ORAL

## 2012-05-03 MED ORDER — ONDANSETRON 8 MG/50ML IVPB (CHCC)
8.0000 mg | Freq: Once | INTRAVENOUS | Status: AC
  Administered 2012-05-03: 8 mg via INTRAVENOUS

## 2012-05-03 MED ORDER — SODIUM CHLORIDE 0.9 % IV SOLN
375.0000 mg/m2 | Freq: Once | INTRAVENOUS | Status: AC
  Administered 2012-05-03: 600 mg via INTRAVENOUS
  Filled 2012-05-03: qty 60

## 2012-05-03 MED ORDER — SODIUM CHLORIDE 0.9 % IV SOLN
Freq: Once | INTRAVENOUS | Status: AC
  Administered 2012-05-03: 10:00:00 via INTRAVENOUS

## 2012-05-03 MED ORDER — DEXAMETHASONE SODIUM PHOSPHATE 10 MG/ML IJ SOLN
10.0000 mg | Freq: Once | INTRAMUSCULAR | Status: AC
  Administered 2012-05-03: 10 mg via INTRAVENOUS

## 2012-05-03 NOTE — Patient Instructions (Addendum)
[facility] Discharge Instructions for Patients Receiving Chemotherapy  Today you received the following chemotherapy agents Rituxan and Treanda.  Take the Dexamethasone Saturday and Sunday after both treatment days.  To help prevent nausea and vomiting after your treatment, we encourage you to take your nausea medication.  If you develop nausea and vomiting that is not controlled by your nausea medication, call the clinic. If it is after clinic hours your family physician or the after hours number for the clinic or go to the Emergency Department.   BELOW ARE SYMPTOMS THAT SHOULD BE REPORTED IMMEDIATELY:  *FEVER GREATER THAN 100.5 F  *CHILLS WITH OR WITHOUT FEVER  NAUSEA AND VOMITING THAT IS NOT CONTROLLED WITH YOUR NAUSEA MEDICATION  *UNUSUAL SHORTNESS OF BREATH  *UNUSUAL BRUISING OR BLEEDING  TENDERNESS IN MOUTH AND THROAT WITH OR WITHOUT PRESENCE OF ULCERS  *URINARY PROBLEMS  *BOWEL PROBLEMS  UNUSUAL RASH Items with * indicate a potential emergency and should be followed up as soon as possible.  One of the nurses will contact you 24 hours after your treatment. Please let the nurse know about any problems that you may have experienced. Feel free to call the clinic you have any questions or concerns. The clinic phone number is [phone].   I have been informed and understand all the instructions given to me. I know to contact the clinic, my physician, or go to the Emergency Department if any problems should occur. I do not have any questions at this time, but understand that I may call the clinic during office hours   should I have any questions or need assistance in obtaining follow up care.    __________________________________________  _____________  __________ Signature of Patient or Authorized Representative            Date                   Time    __________________________________________ Nurse's Signature

## 2012-05-03 NOTE — Progress Notes (Signed)
1052-NS increased to 518mls/hr. Complains of numbness and tingling in fingers. Dr Donnie Coffin notified.  1110- Dr Donnie Coffin at bedside. Patient to have NS bolus started.

## 2012-05-04 ENCOUNTER — Ambulatory Visit (HOSPITAL_BASED_OUTPATIENT_CLINIC_OR_DEPARTMENT_OTHER): Payer: Medicare Other

## 2012-05-04 VITALS — BP 106/61 | HR 81 | Temp 98.3°F | Resp 18

## 2012-05-04 DIAGNOSIS — C9110 Chronic lymphocytic leukemia of B-cell type not having achieved remission: Secondary | ICD-10-CM

## 2012-05-04 DIAGNOSIS — Z5111 Encounter for antineoplastic chemotherapy: Secondary | ICD-10-CM

## 2012-05-04 MED ORDER — ONDANSETRON 8 MG/50ML IVPB (CHCC)
8.0000 mg | Freq: Once | INTRAVENOUS | Status: AC
  Administered 2012-05-04: 8 mg via INTRAVENOUS

## 2012-05-04 MED ORDER — SODIUM CHLORIDE 0.9 % IV SOLN
Freq: Once | INTRAVENOUS | Status: AC
  Administered 2012-05-04: 15:00:00 via INTRAVENOUS

## 2012-05-04 MED ORDER — SODIUM CHLORIDE 0.9 % IV SOLN
90.0000 mg/m2 | Freq: Once | INTRAVENOUS | Status: AC
  Administered 2012-05-04: 155 mg via INTRAVENOUS
  Filled 2012-05-04: qty 31

## 2012-05-04 MED ORDER — DEXAMETHASONE SODIUM PHOSPHATE 10 MG/ML IJ SOLN
10.0000 mg | Freq: Once | INTRAMUSCULAR | Status: AC
  Administered 2012-05-04: 10 mg via INTRAVENOUS

## 2012-05-04 NOTE — Patient Instructions (Signed)
[facility] Discharge Instructions for Patients Receiving Chemotherapy  Today you received the following chemotherapy agents Treanda.  To help prevent nausea and vomiting after your treatment, we encourage you to take your nausea medication.   If you develop nausea and vomiting that is not controlled by your nausea medication, call the clinic. If it is after clinic hours your family physician or the after hours number for the clinic or go to the Emergency Department.   BELOW ARE SYMPTOMS THAT SHOULD BE REPORTED IMMEDIATELY:  *FEVER GREATER THAN 100.5 F  *CHILLS WITH OR WITHOUT FEVER  NAUSEA AND VOMITING THAT IS NOT CONTROLLED WITH YOUR NAUSEA MEDICATION  *UNUSUAL SHORTNESS OF BREATH  *UNUSUAL BRUISING OR BLEEDING  TENDERNESS IN MOUTH AND THROAT WITH OR WITHOUT PRESENCE OF ULCERS  *URINARY PROBLEMS  *BOWEL PROBLEMS  UNUSUAL RASH Items with * indicate a potential emergency and should be followed up as soon as possible.  One of the nurses will contact you 24 hours after your treatment. Please let the nurse know about any problems that you may have experienced. Feel free to call the clinic you have any questions or concerns. The clinic phone number is [phone].   I have been informed and understand all the instructions given to me. I know to contact the clinic, my physician, or go to the Emergency Department if any problems should occur. I do not have any questions at this time, but understand that I may call the clinic during office hours   should I have any questions or need assistance in obtaining follow up care.    __________________________________________  _____________  __________ Signature of Patient or Authorized Representative            Date                   Time    __________________________________________ Nurse's Signature

## 2012-05-07 ENCOUNTER — Telehealth: Payer: Self-pay | Admitting: *Deleted

## 2012-05-07 NOTE — Telephone Encounter (Signed)
F/U call made to patient.  Discussed her b/p and heart disease.  Takes Lotrel for blood pressure and HCTZ for heart failure.  Reports b/p low during first treatment so she thinks she'll hold the b/p medicine again but will take the HCTZ.  Asked that she discuss this with Dr. Donnie Coffin at f/u appt on 05-10-2012 due to the action HCTZ has on the body.  This nurse instructed her not to take either of these meds on treatment days until after chemotherapy completed.     Other symptoms expressed are fatigue, indigestion but denies nausea.  Reports having an intermittent nose bleed and using a saline nasal spray.  No problems with bowel or bladder.

## 2012-05-07 NOTE — Telephone Encounter (Signed)
Message copied by Augusto Garbe on Mon May 07, 2012  3:53 PM ------      Message from: Lorenza Evangelist A      Created: Thu May 03, 2012  4:59 PM      Regarding: Chemo follow up call       First time Rituxan and Treanda. Dr Donnie Coffin.            Here for Treanda only on 10/25            Thanks

## 2012-05-10 ENCOUNTER — Other Ambulatory Visit (HOSPITAL_BASED_OUTPATIENT_CLINIC_OR_DEPARTMENT_OTHER): Payer: Medicare Other | Admitting: Lab

## 2012-05-10 ENCOUNTER — Telehealth: Payer: Self-pay | Admitting: Oncology

## 2012-05-10 ENCOUNTER — Ambulatory Visit (HOSPITAL_BASED_OUTPATIENT_CLINIC_OR_DEPARTMENT_OTHER): Payer: Medicare Other | Admitting: Oncology

## 2012-05-10 VITALS — BP 116/69 | HR 105 | Temp 98.0°F | Resp 20 | Ht 64.0 in | Wt 140.4 lb

## 2012-05-10 DIAGNOSIS — Z856 Personal history of leukemia: Secondary | ICD-10-CM

## 2012-05-10 DIAGNOSIS — D0590 Unspecified type of carcinoma in situ of unspecified breast: Secondary | ICD-10-CM

## 2012-05-10 DIAGNOSIS — C9110 Chronic lymphocytic leukemia of B-cell type not having achieved remission: Secondary | ICD-10-CM

## 2012-05-10 DIAGNOSIS — Z8542 Personal history of malignant neoplasm of other parts of uterus: Secondary | ICD-10-CM

## 2012-05-10 LAB — CBC WITH DIFFERENTIAL/PLATELET
BASO%: 0.1 % (ref 0.0–2.0)
Basophils Absolute: 0 10*3/uL (ref 0.0–0.1)
EOS%: 0.1 % (ref 0.0–7.0)
MCH: 31.4 pg (ref 25.1–34.0)
MCHC: 32.8 g/dL (ref 31.5–36.0)
MCV: 95.9 fL (ref 79.5–101.0)
MONO%: 1.4 % (ref 0.0–14.0)
RBC: 3.62 10*6/uL — ABNORMAL LOW (ref 3.70–5.45)
RDW: 15.5 % — ABNORMAL HIGH (ref 11.2–14.5)
lymph#: 23.8 10*3/uL — ABNORMAL HIGH (ref 0.9–3.3)

## 2012-05-10 LAB — COMPREHENSIVE METABOLIC PANEL (CC13)
ALT: 23 U/L (ref 0–55)
AST: 17 U/L (ref 5–34)
Albumin: 3.5 g/dL (ref 3.5–5.0)
Alkaline Phosphatase: 65 U/L (ref 40–150)
BUN: 23 mg/dL (ref 7.0–26.0)
Potassium: 3.8 mEq/L (ref 3.5–5.1)

## 2012-05-10 NOTE — Telephone Encounter (Signed)
gve the pt her nov 2013 appt calendar °

## 2012-05-10 NOTE — Progress Notes (Signed)
Hematology and Oncology Follow Up Visit  Margaret Mann [id number] Apr 28, 1933 78 y.o. 05/10/2012 12:39 PM   DIAGNOSIS:  PROBLEM LIST:   1. 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   2. History of stage IA endometrial cancer status post TAH and BSO on 09/02/2005 status post adjuvant chemotherapy and brachytherapy.  Completed treatments July 2010. 3. History of CLL on observation. Status post recent bone marrow biopsy, status post first cycle of rituxan/treanda day 7, cycle 1   PAST THERAPY:  As above  Interim History: She did well with her first cycle. She did have hypotension with Rituxan infusion which required some modification of the infusion weight. She is on hypertensive medication and we discussed stopping this for 2 days prior to starting the next cycle of treatment. She feels otherwise well little fatigue. She has lost libido weight but is eating voraciously. She has no other complaints. Denies headaches blurred vision shortness of breath or cough.  Medications: I have reviewed the patient's current medications.  Allergies: No Known Allergies  Past Medical History, Surgical history, Social history, and Family History were reviewed and updated.  Review of Systems: Constitutional:  Negative for fever, chills, night sweats, anorexia, weight loss, pain. Cardiovascular: no chest pain or dyspnea on exertion Respiratory: no cough, shortness of breath, or wheezing Neurological: negative Dermatological: negative ENT: negative Skin Gastrointestinal: negative Genito-Urinary: negative Hematological and Lymphatic: negative Breast: negative Musculoskeletal: negative Remaining ROS negative.  Physical Exam:  Blood pressure 116/69, pulse 105, temperature 98 F (36.7 C), resp. rate 20, height 5\' 4"  (1.626 m), weight 140 lb 6.4 oz (63.685 kg).  ECOG: 0 HEENT:  Sclerae anicteric,  conjunctivae pink.  Oropharynx clear.  No mucositis or candidiasis.  Nodes:  No cervical, supraclavicular, or axillary lymphadenopathy palpated.  Breast Exam:  Right breast is benign.  No masses, discharge, skin change, or nipple inversion.  Left breast is benign.  No masses, discharge, skin change, or nipple inversion..  Lungs:  Clear to auscultation bilaterally.  No crackles, rhonchi, or wheezes.  Heart:  Regular rate and rhythm.  Abdomen:  Soft, nontender.  Positive bowel sounds.  No organomegaly or masses palpated.  Musculoskeletal:  No focal spinal tenderness to palpation.  Extremities:  Benign.  No peripheral edema or cyanosis.  Skin:  Benign.  Neuro:  Nonfocal.      Lab Results: Lab Results  Component Value Date   WBC 28.1* 05/10/2012   HGB 11.4* 05/10/2012   HCT 34.7* 05/10/2012   MCV 95.9 05/10/2012   PLT 265 05/10/2012     Chemistry      Component Value Date/Time   NA 135* 05/10/2012 1057   NA 141 03/02/2012 0938   K 3.8 05/10/2012 1057   K 4.3 03/02/2012 0938   CL 98 05/10/2012 1057   CL 104 03/02/2012 0938   CO2 31* 05/10/2012 1057   CO2 25 03/02/2012 0938   BUN 23.0 05/10/2012 1057   BUN 24* 03/02/2012 0938   CREATININE 0.8 05/10/2012 1057   CREATININE 0.89 03/02/2012 0938      Component Value Date/Time   CALCIUM 8.9 05/10/2012 1057   CALCIUM 9.6 03/02/2012 0938   ALKPHOS 65 05/10/2012 1057   ALKPHOS 55 03/02/2012 0938   AST 17 05/10/2012 1057   AST 26 03/02/2012 0938   ALT 23 05/10/2012 1057   ALT 12 03/02/2012 0938   BILITOT 0.71 05/10/2012 1057   BILITOT 0.5 03/02/2012  1610       Radiological Studies:  No results found.   IMPRESSIONS AND PLAN: A 78 y.o. female with   History of DCIS having completed 5 years of tamoxifen. I recommended we stop that at this point. History of CLL, stage 0 on observation since diagnosis was made.   Patient is doing well after her first cycle 4 chemotherapy. We will see her in about 2-3 weeks for the next round. She is quite  excited about her lowered white count. Once again we advised her about stopping her hypertensive medications as before treatment. Anticipates that doing a total of 6 cycles of fall by imaging. She'll continue allopurinol and acyclovir. Pierce Crane M.D. Salvatore Marvel 10/31/201312:39 PM Cell [phone]

## 2012-05-30 ENCOUNTER — Other Ambulatory Visit: Payer: Medicare Other | Admitting: Lab

## 2012-05-30 ENCOUNTER — Ambulatory Visit (HOSPITAL_BASED_OUTPATIENT_CLINIC_OR_DEPARTMENT_OTHER): Payer: Medicare Other | Admitting: Oncology

## 2012-05-30 ENCOUNTER — Telehealth: Payer: Self-pay | Admitting: *Deleted

## 2012-05-30 ENCOUNTER — Ambulatory Visit: Payer: Medicare Other | Admitting: Oncology

## 2012-05-30 ENCOUNTER — Other Ambulatory Visit (HOSPITAL_BASED_OUTPATIENT_CLINIC_OR_DEPARTMENT_OTHER): Payer: Medicare Other | Admitting: Lab

## 2012-05-30 VITALS — BP 128/77 | HR 87 | Temp 97.5°F | Resp 20 | Ht 64.0 in | Wt 145.0 lb

## 2012-05-30 DIAGNOSIS — C9110 Chronic lymphocytic leukemia of B-cell type not having achieved remission: Secondary | ICD-10-CM

## 2012-05-30 DIAGNOSIS — Z8542 Personal history of malignant neoplasm of other parts of uterus: Secondary | ICD-10-CM

## 2012-05-30 DIAGNOSIS — Z853 Personal history of malignant neoplasm of breast: Secondary | ICD-10-CM

## 2012-05-30 LAB — CBC WITH DIFFERENTIAL/PLATELET
Basophils Absolute: 0.1 10*3/uL (ref 0.0–0.1)
EOS%: 1 % (ref 0.0–7.0)
Eosinophils Absolute: 0.1 10*3/uL (ref 0.0–0.5)
HCT: 34.4 % — ABNORMAL LOW (ref 34.8–46.6)
HGB: 11.6 g/dL (ref 11.6–15.9)
MCH: 31.9 pg (ref 25.1–34.0)
MCV: 94.1 fL (ref 79.5–101.0)
NEUT#: 3.1 10*3/uL (ref 1.5–6.5)
NEUT%: 33.8 % — ABNORMAL LOW (ref 38.4–76.8)
RDW: 14.9 % — ABNORMAL HIGH (ref 11.2–14.5)
lymph#: 5.1 10*3/uL — ABNORMAL HIGH (ref 0.9–3.3)

## 2012-05-30 LAB — COMPREHENSIVE METABOLIC PANEL (CC13)
AST: 20 U/L (ref 5–34)
Albumin: 3.7 g/dL (ref 3.5–5.0)
BUN: 15 mg/dL (ref 7.0–26.0)
Calcium: 9.5 mg/dL (ref 8.4–10.4)
Chloride: 101 mEq/L (ref 98–107)
Creatinine: 0.7 mg/dL (ref 0.6–1.1)
Glucose: 98 mg/dl (ref 70–99)
Potassium: 4 mEq/L (ref 3.5–5.1)

## 2012-05-30 LAB — LACTATE DEHYDROGENASE (CC13): LDH: 146 U/L (ref 125–245)

## 2012-05-30 LAB — TECHNOLOGIST REVIEW

## 2012-05-30 NOTE — Telephone Encounter (Signed)
Per staff message and POF I have scheduled appts.  JMW  

## 2012-05-30 NOTE — Progress Notes (Signed)
Hematology and Oncology Follow Up Visit  Margaret Mann [id number] [birth year] 78 y.o. 05/30/2012 9:52 PM   DIAGNOSIS:  PROBLEM LIST:   1. 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   2. History of stage IA endometrial cancer status post TAH and BSO on 09/02/2005 status post adjuvant chemotherapy and brachytherapy.  Completed treatments July 2010. 3. History of CLL on observation. Status post recent bone marrow biopsy, status post first cycle of rituxan/treanda day -1, cycle 2   PAST THERAPY:  As above  Interim History: she is doing well, she has no complaints. She denies fevers, her energy level is somewhat reduced. Medications: I have reviewed the patient's current medications.  Allergies: No Known Allergies  Past Medical History, Surgical history, Social history, and Family History were reviewed and updated.  Review of Systems: Constitutional:  Negative for fever, chills, night sweats, anorexia, weight loss, pain. Cardiovascular: no chest pain or dyspnea on exertion Respiratory: no cough, shortness of breath, or wheezing Neurological: negative Dermatological: negative ENT: negative Skin Gastrointestinal: negative Genito-Urinary: negative Hematological and Lymphatic: negative Breast: negative Musculoskeletal: negative Remaining ROS negative.  Physical Exam:  Blood pressure 128/77, pulse 87, temperature 97.5 F (36.4 C), temperature source Oral, resp. rate 20, height 5\' 4"  (1.626 m), weight 145 lb (65.772 kg).  ECOG: 0 HEENT:  Sclerae anicteric, conjunctivae pink.  Oropharynx clear.  No mucositis or candidiasis.  Nodes:  No cervical, supraclavicular, or axillary lymphadenopathy palpated.  Breast Exam:  Right breast is benign.  No masses, discharge, skin change, or nipple inversion.  Left breast is benign.  No masses, discharge, skin change, or nipple inversion..  Lungs:  Clear to  auscultation bilaterally.  No crackles, rhonchi, or wheezes.  Heart:  Regular rate and rhythm.  Abdomen:  Soft, nontender.  Positive bowel sounds.  No organomegaly or masses palpated.  Musculoskeletal:  No focal spinal tenderness to palpation.  Extremities:  Benign.  No peripheral edema or cyanosis.  Skin:  Benign.  Neuro:  Nonfocal.      Lab Results: Lab Results  Component Value Date   WBC 9.0 05/30/2012   HGB 11.6 05/30/2012   HCT 34.4* 05/30/2012   MCV 94.1 05/30/2012   PLT 214 05/30/2012     Chemistry      Component Value Date/Time   NA 137 05/30/2012 1442   NA 141 03/02/2012 0938   K 4.0 05/30/2012 1442   K 4.3 03/02/2012 0938   CL 101 05/30/2012 1442   CL 104 03/02/2012 0938   CO2 31* 05/30/2012 1442   CO2 25 03/02/2012 0938   BUN 15.0 05/30/2012 1442   BUN 24* 03/02/2012 0938   CREATININE 0.7 05/30/2012 1442   CREATININE 0.89 03/02/2012 0938      Component Value Date/Time   CALCIUM 9.5 05/30/2012 1442   CALCIUM 9.6 03/02/2012 0938   ALKPHOS 63 05/30/2012 1442   ALKPHOS 55 03/02/2012 0938   AST 20 05/30/2012 1442   AST 26 03/02/2012 0938   ALT 13 05/30/2012 1442   ALT 12 03/02/2012 0938   BILITOT 0.37 05/30/2012 1442   BILITOT 0.5 03/02/2012 7829       Radiological Studies:  No results found.   IMPRESSIONS AND PLAN: A 78 y.o. female with   History of DCIS having completed 5 years of tamoxifen. I recommended we stop that at this point. History of CLL, stage 0 on observation since diagnosis was made. She is  due for cycle 2 tomorrow. Her wbc is 9 today which she is excited about. We plan on repeating a  CT after cycle 3.  This will take place likely in January   . She'll continue allopurinol and acyclovir. Pierce Crane M.D. Salvatore Marvel 11/20/20139:52 PM Cell [phone]

## 2012-05-31 ENCOUNTER — Telehealth: Payer: Self-pay | Admitting: *Deleted

## 2012-05-31 ENCOUNTER — Ambulatory Visit (HOSPITAL_BASED_OUTPATIENT_CLINIC_OR_DEPARTMENT_OTHER): Payer: Medicare Other

## 2012-05-31 VITALS — BP 96/55 | HR 74 | Temp 97.4°F | Resp 18

## 2012-05-31 DIAGNOSIS — Z5112 Encounter for antineoplastic immunotherapy: Secondary | ICD-10-CM

## 2012-05-31 DIAGNOSIS — C9110 Chronic lymphocytic leukemia of B-cell type not having achieved remission: Secondary | ICD-10-CM

## 2012-05-31 DIAGNOSIS — Z5111 Encounter for antineoplastic chemotherapy: Secondary | ICD-10-CM

## 2012-05-31 MED ORDER — DEXAMETHASONE SODIUM PHOSPHATE 10 MG/ML IJ SOLN
10.0000 mg | Freq: Once | INTRAMUSCULAR | Status: AC
  Administered 2012-05-31: 10 mg via INTRAVENOUS

## 2012-05-31 MED ORDER — DIPHENHYDRAMINE HCL 25 MG PO CAPS
50.0000 mg | ORAL_CAPSULE | Freq: Once | ORAL | Status: AC
  Administered 2012-05-31: 50 mg via ORAL

## 2012-05-31 MED ORDER — SODIUM CHLORIDE 0.9 % IV SOLN
90.0000 mg/m2 | Freq: Once | INTRAVENOUS | Status: AC
  Administered 2012-05-31: 155 mg via INTRAVENOUS
  Filled 2012-05-31: qty 31

## 2012-05-31 MED ORDER — ACETAMINOPHEN 325 MG PO TABS
650.0000 mg | ORAL_TABLET | Freq: Once | ORAL | Status: AC
  Administered 2012-05-31: 650 mg via ORAL

## 2012-05-31 MED ORDER — ONDANSETRON 8 MG/50ML IVPB (CHCC)
8.0000 mg | Freq: Once | INTRAVENOUS | Status: AC
  Administered 2012-05-31: 8 mg via INTRAVENOUS

## 2012-05-31 MED ORDER — SODIUM CHLORIDE 0.9 % IV SOLN
Freq: Once | INTRAVENOUS | Status: AC
Start: 2012-05-31 — End: 2012-05-31
  Administered 2012-05-31: 08:00:00 via INTRAVENOUS

## 2012-05-31 MED ORDER — SODIUM CHLORIDE 0.9 % IV SOLN
375.0000 mg/m2 | Freq: Once | INTRAVENOUS | Status: AC
  Administered 2012-05-31: 600 mg via INTRAVENOUS
  Filled 2012-05-31: qty 60

## 2012-05-31 NOTE — Patient Instructions (Signed)
[facility] Discharge Instructions for Patients Receiving Chemotherapy  Today you received the following chemotherapy agents Rituxan/Treanda  To help prevent nausea and vomiting after your treatment, we encourage you to take your nausea medication as prescribed.If you develop nausea and vomiting that is not controlled by your nausea medication, call the clinic. If it is after clinic hours your family physician or the after hours number for the clinic or go to the Emergency Department.   BELOW ARE SYMPTOMS THAT SHOULD BE REPORTED IMMEDIATELY:  *FEVER GREATER THAN 100.5 F  *CHILLS WITH OR WITHOUT FEVER  NAUSEA AND VOMITING THAT IS NOT CONTROLLED WITH YOUR NAUSEA MEDICATION  *UNUSUAL SHORTNESS OF BREATH  *UNUSUAL BRUISING OR BLEEDING  TENDERNESS IN MOUTH AND THROAT WITH OR WITHOUT PRESENCE OF ULCERS  *URINARY PROBLEMS  *BOWEL PROBLEMS  UNUSUAL RASH Items with * indicate a potential emergency and should be followed up as soon as possible.  One of the nurses will contact you 24 hours after your treatment. Please let the nurse know about any problems that you may have experienced. Feel free to call the clinic you have any questions or concerns. The clinic phone number is [phone].   I have been informed and understand all the instructions given to me. I know to contact the clinic, my physician, or go to the Emergency Department if any problems should occur. I do not have any questions at this time, but understand that I may call the clinic during office hours   should I have any questions or need assistance in obtaining follow up care.    __________________________________________  _____________  __________ Signature of Patient or Authorized Representative            Date                   Time    __________________________________________ Nurse's Signature   Bendamustine Injection What is this medicine? BENDAMUSTINE (BEN da MUS teen) is a chemotherapy  drug. It is used to treat chronic lymphocytic leukemia and non-Hodgkin lymphoma. This medicine may be used for other purposes; ask your health care provider or pharmacist if you have questions. What should I tell my health care provider before I take this medicine? They need to know if you have any of these conditions: -kidney disease -liver disease -an unusual or allergic reaction to bendamustine, mannitol, other medicines, foods, dyes, or preservatives -pregnant or trying to get pregnant -breast-feeding How should I use this medicine? This medicine is for infusion into a vein. It is given by a health care professional in a hospital or clinic setting. Talk to your pediatrician regarding the use of this medicine in children. Special care may be needed. Overdosage: If you think you have taken too much of this medicine contact a poison control center or emergency room at once. NOTE: This medicine is only for you. Do not share this medicine with others. What if I miss a dose? It is important not to miss your dose. Call your doctor or health care professional if you are unable to keep an appointment. What may interact with this medicine? Do not take this medicine with any of the following medications: -clozapine This medicine may also interact with the following medications: -atazanavir -cimetidine -ciprofloxacin -enoxacin -fluvoxamine -medicines for seizures like carbamazepine and phenobarbital -mexiletine -rifampin -tacrine -thiabendazole -zileuton This list may not describe all possible interactions. Give your health care provider a list of all the medicines, herbs, non-prescription drugs, or dietary supplements you  use. Also tell them if you smoke, drink alcohol, or use illegal drugs. Some items may interact with your medicine. What should I watch for while using this medicine? Your condition will be monitored carefully while you are receiving this medicine. This drug may make you  feel generally unwell. This is not uncommon, as chemotherapy can affect healthy cells as well as cancer cells. Report any side effects. Continue your course of treatment even though you feel ill unless your doctor tells you to stop. Call your doctor or health care professional for advice if you get a fever, chills or sore throat, or other symptoms of a cold or flu. Do not treat yourself. This drug decreases your body's ability to fight infections. Try to avoid being around people who are sick. This medicine may increase your risk to bruise or bleed. Call your doctor or health care professional if you notice any unusual bleeding. Be careful brushing and flossing your teeth or using a toothpick because you may get an infection or bleed more easily. If you have any dental work done, tell your dentist you are receiving this medicine. Avoid taking products that contain aspirin, acetaminophen, ibuprofen, naproxen, or ketoprofen unless instructed by your doctor. These medicines may hide a fever. Do not become pregnant while taking this medicine. Women should inform their doctor if they wish to become pregnant or think they might be pregnant. There is a potential for serious side effects to an unborn child. Men should inform their doctors if they wish to father a child. This medicine may lower sperm counts. Talk to your health care professional or pharmacist for more information. Do not breast-feed an infant while taking this medicine. What side effects may I notice from receiving this medicine? Side effects that you should report to your doctor or health care professional as soon as possible: -allergic reactions like skin rash, itching or hives, swelling of the face, lips, or tongue -low blood counts - this medicine may decrease the number of white blood cells, red blood cells and platelets. You may be at increased risk for infections and bleeding. -signs of infection - fever or chills, cough, sore throat, pain  or difficulty passing urine -signs of decreased platelets or bleeding - bruising, pinpoint red spots on the skin, black, tarry stools, blood in the urine -signs of decreased red blood cells - unusually weak or tired, fainting spells, lightheadedness -trouble passing urine or change in the amount of urine Side effects that usually do not require medical attention (report to your doctor or health care professional if they continue or are bothersome): -diarrhea This list may not describe all possible side effects. Call your doctor for medical advice about side effects. You may report side effects to FDA at 1-800-FDA-1088. Where should I keep my medicine? This drug is given in a hospital or clinic and will not be stored at home. NOTE: This sheet is a summary. It may not cover all possible information. If you have questions about this medicine, talk to your doctor, pharmacist, or health care provider.  2013, [employer]. (09/23/2011 2:15:47 PM)

## 2012-05-31 NOTE — Telephone Encounter (Signed)
chemo 12/12 and 13 with ov , ov on 12/3

## 2012-06-01 ENCOUNTER — Ambulatory Visit (HOSPITAL_BASED_OUTPATIENT_CLINIC_OR_DEPARTMENT_OTHER): Payer: Medicare Other

## 2012-06-01 VITALS — BP 111/62 | HR 66 | Temp 98.4°F | Resp 18

## 2012-06-01 DIAGNOSIS — Z5111 Encounter for antineoplastic chemotherapy: Secondary | ICD-10-CM

## 2012-06-01 DIAGNOSIS — C9110 Chronic lymphocytic leukemia of B-cell type not having achieved remission: Secondary | ICD-10-CM

## 2012-06-01 MED ORDER — ONDANSETRON 8 MG/50ML IVPB (CHCC)
8.0000 mg | Freq: Once | INTRAVENOUS | Status: AC
  Administered 2012-06-01: 8 mg via INTRAVENOUS

## 2012-06-01 MED ORDER — DEXAMETHASONE SODIUM PHOSPHATE 10 MG/ML IJ SOLN
10.0000 mg | Freq: Once | INTRAMUSCULAR | Status: AC
  Administered 2012-06-01: 10 mg via INTRAVENOUS

## 2012-06-01 MED ORDER — SODIUM CHLORIDE 0.9 % IV SOLN
90.0000 mg/m2 | Freq: Once | INTRAVENOUS | Status: AC
  Administered 2012-06-01: 155 mg via INTRAVENOUS
  Filled 2012-06-01: qty 31

## 2012-06-01 MED ORDER — SODIUM CHLORIDE 0.9 % IV SOLN
Freq: Once | INTRAVENOUS | Status: AC
  Administered 2012-06-01: 10:00:00 via INTRAVENOUS

## 2012-06-01 NOTE — Patient Instructions (Signed)
[facility] Discharge Instructions for Patients Receiving Chemotherapy  Today you received the following chemotherapy agents: Treanda  To help prevent nausea and vomiting after your treatment, we encourage you to take your nausea medication as directed by your MD.  If you develop nausea and vomiting that is not controlled by your nausea medication, call the clinic. If it is after clinic hours your family physician or the after hours number for the clinic or go to the Emergency Department.   BELOW ARE SYMPTOMS THAT SHOULD BE REPORTED IMMEDIATELY:  *FEVER GREATER THAN 100.5 F  *CHILLS WITH OR WITHOUT FEVER  NAUSEA AND VOMITING THAT IS NOT CONTROLLED WITH YOUR NAUSEA MEDICATION  *UNUSUAL SHORTNESS OF BREATH  *UNUSUAL BRUISING OR BLEEDING  TENDERNESS IN MOUTH AND THROAT WITH OR WITHOUT PRESENCE OF ULCERS  *URINARY PROBLEMS  *BOWEL PROBLEMS  UNUSUAL RASH Items with * indicate a potential emergency and should be followed up as soon as possible.  Feel free to call the clinic you have any questions or concerns. The clinic phone number is [phone].    

## 2012-06-12 ENCOUNTER — Other Ambulatory Visit (HOSPITAL_BASED_OUTPATIENT_CLINIC_OR_DEPARTMENT_OTHER): Payer: Medicare Other | Admitting: Lab

## 2012-06-12 ENCOUNTER — Telehealth: Payer: Self-pay | Admitting: *Deleted

## 2012-06-12 ENCOUNTER — Ambulatory Visit (HOSPITAL_BASED_OUTPATIENT_CLINIC_OR_DEPARTMENT_OTHER): Payer: Medicare Other | Admitting: Oncology

## 2012-06-12 ENCOUNTER — Other Ambulatory Visit: Payer: Self-pay | Admitting: *Deleted

## 2012-06-12 VITALS — BP 119/66 | HR 92 | Temp 97.9°F | Resp 20 | Ht 64.0 in | Wt 144.6 lb

## 2012-06-12 DIAGNOSIS — C9110 Chronic lymphocytic leukemia of B-cell type not having achieved remission: Secondary | ICD-10-CM

## 2012-06-12 DIAGNOSIS — Z8542 Personal history of malignant neoplasm of other parts of uterus: Secondary | ICD-10-CM

## 2012-06-12 DIAGNOSIS — Z853 Personal history of malignant neoplasm of breast: Secondary | ICD-10-CM

## 2012-06-12 LAB — CBC WITH DIFFERENTIAL/PLATELET
Basophils Absolute: 0 10*3/uL (ref 0.0–0.1)
EOS%: 0.8 % (ref 0.0–7.0)
Eosinophils Absolute: 0.1 10*3/uL (ref 0.0–0.5)
HGB: 12 g/dL (ref 11.6–15.9)
LYMPH%: 46.8 % (ref 14.0–49.7)
MCH: 31.7 pg (ref 25.1–34.0)
MCV: 93.4 fL (ref 79.5–101.0)
MONO%: 10 % (ref 0.0–14.0)
NEUT#: 3.5 10*3/uL (ref 1.5–6.5)
NEUT%: 42.1 % (ref 38.4–76.8)
Platelets: 220 10*3/uL (ref 145–400)

## 2012-06-12 LAB — COMPREHENSIVE METABOLIC PANEL (CC13)
Albumin: 3.5 g/dL (ref 3.5–5.0)
Alkaline Phosphatase: 60 U/L (ref 40–150)
BUN: 11 mg/dL (ref 7.0–26.0)
Creatinine: 0.7 mg/dL (ref 0.6–1.1)
Glucose: 100 mg/dl — ABNORMAL HIGH (ref 70–99)
Total Bilirubin: 0.58 mg/dL (ref 0.20–1.20)

## 2012-06-12 NOTE — Telephone Encounter (Signed)
GAVE PATIENT APPOINTMENT FOR 06-28-2012

## 2012-06-12 NOTE — Progress Notes (Signed)
Hematology and Oncology Follow Up Visit  Margaret Mann [id number] [birth year] 78 y.o. 06/12/2012 11:43 AM   DIAGNOSIS:  PROBLEM LIST:   1. 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   2. History of stage IA endometrial cancer status post TAH and BSO on 09/02/2005 status post adjuvant chemotherapy and brachytherapy.  Completed treatments July 2010. 3. History of CLL on observation. Status post recent bone marrow biopsy, status post first cycle of rituxan/treanda day 7, cycle 2   PAST THERAPY:  As above  Interim History: she is doing well, she has no complaints. She denies fevers, her energy level is somewhat reduced. Medications: I have reviewed the patient's current medications.  Allergies: No Known Allergies  Past Medical History, Surgical history, Social history, and Family History were reviewed and updated.  Review of Systems: Constitutional:  Negative for fever, chills, night sweats, anorexia, weight loss, pain., she isweak the Wednesday following chemo. Cardiovascular: no chest pain or dyspnea on exertion Respiratory: no cough, shortness of breath, or wheezing Neurological: negative Dermatological: negative ENT: negative Skin Gastrointestinal: negative Genito-Urinary: negative Hematological and Lymphatic: negative Breast: negative Musculoskeletal: negative Remaining ROS negative.  Physical Exam:  Blood pressure 119/66, pulse 92, temperature 97.9 F (36.6 C), resp. rate 20, height 5\' 4"  (1.626 m), weight 144 lb 9.6 oz (65.59 kg).  ECOG: 0 HEENT:  Sclerae anicteric, conjunctivae pink.  Oropharynx clear.  No mucositis or candidiasis.  Nodes:  No cervical, supraclavicular, or axillary lymphadenopathy palpated.  Breast Exam:  Right breast is benign.  No masses, discharge, skin change, or nipple inversion.  Left breast is benign.  No masses, discharge, skin change, or nipple  inversion..  Lungs:  Clear to auscultation bilaterally.  No crackles, rhonchi, or wheezes.  Heart:  Regular rate and rhythm.  Abdomen:  Soft, nontender.  Positive bowel sounds.  No organomegaly or masses palpated.  Musculoskeletal:  No focal spinal tenderness to palpation.  Extremities:  Benign.  No peripheral edema or cyanosis.  Skin:  Benign.  Neuro:  Nonfocal.      Lab Results: Lab Results  Component Value Date   WBC 8.2 06/12/2012   HGB 12.0 06/12/2012   HCT 35.5 06/12/2012   MCV 93.4 06/12/2012   PLT 220 06/12/2012     Chemistry      Component Value Date/Time   NA 138 06/12/2012 0930   NA 141 03/02/2012 0938   K 4.0 06/12/2012 0930   K 4.3 03/02/2012 0938   CL 98 06/12/2012 0930   CL 104 03/02/2012 0938   CO2 30* 06/12/2012 0930   CO2 25 03/02/2012 0938   BUN 11.0 06/12/2012 0930   BUN 24* 03/02/2012 0938   CREATININE 0.7 06/12/2012 0930   CREATININE 0.89 03/02/2012 0938      Component Value Date/Time   CALCIUM 9.6 06/12/2012 0930   CALCIUM 9.6 03/02/2012 0938   ALKPHOS 60 06/12/2012 0930   ALKPHOS 55 03/02/2012 0938   AST 21 06/12/2012 0930   AST 26 03/02/2012 0938   ALT 15 06/12/2012 0930   ALT 12 03/02/2012 0938   BILITOT 0.58 06/12/2012 0930   BILITOT 0.5 03/02/2012 4098       Radiological Studies:  No results found.   IMPRESSIONS AND PLAN: A 78 y.o. female with   History of DCIS having completed 5 years of tamoxifen. I recommended we stop that at this point. History of CLL, stage 0 on observation since  diagnosis was made. She is due for cycle 3 in 1 week . Her wbc is 8.2 today which she is excited about. We plan on repeating a  CT after cycle 3.  This will take place on 06/28/12. Overall excellent response to treatment. 30 minutes sone t on counselling and treatment planning.  . She'll continue allopurinol and acyclovir. Pierce Crane M.D. Salvatore Marvel 12/3/201311:43 AM Cell [phone]

## 2012-06-15 ENCOUNTER — Telehealth: Payer: Self-pay | Admitting: Oncology

## 2012-06-15 ENCOUNTER — Telehealth: Payer: Self-pay | Admitting: *Deleted

## 2012-06-15 NOTE — Telephone Encounter (Signed)
Per staff message I have adjusted appt. JMW  

## 2012-06-15 NOTE — Telephone Encounter (Signed)
lmonvm adviisng the pt of her appt schedule for 06/21/2012@9 :15am along with the tx to follow

## 2012-06-15 NOTE — Telephone Encounter (Signed)
Sent michelle a staff message to move the pt's chemo to follow the md visit on 06/21/2012

## 2012-06-19 ENCOUNTER — Other Ambulatory Visit: Payer: Self-pay | Admitting: *Deleted

## 2012-06-19 DIAGNOSIS — C9110 Chronic lymphocytic leukemia of B-cell type not having achieved remission: Secondary | ICD-10-CM

## 2012-06-19 DIAGNOSIS — C55 Malignant neoplasm of uterus, part unspecified: Secondary | ICD-10-CM

## 2012-06-20 ENCOUNTER — Other Ambulatory Visit: Payer: Self-pay | Admitting: Physician Assistant

## 2012-06-20 ENCOUNTER — Telehealth: Payer: Self-pay | Admitting: *Deleted

## 2012-06-20 NOTE — Telephone Encounter (Signed)
Patient confirmed over the phone the new date and time on 06-21-2012 starting 8:45am

## 2012-06-21 ENCOUNTER — Ambulatory Visit: Payer: Medicare Other

## 2012-06-21 ENCOUNTER — Other Ambulatory Visit (HOSPITAL_BASED_OUTPATIENT_CLINIC_OR_DEPARTMENT_OTHER): Payer: Medicare Other | Admitting: Lab

## 2012-06-21 ENCOUNTER — Telehealth: Payer: Self-pay | Admitting: *Deleted

## 2012-06-21 ENCOUNTER — Ambulatory Visit: Payer: Medicare Other | Admitting: Physician Assistant

## 2012-06-21 VITALS — BP 100/62 | HR 92 | Temp 98.4°F | Resp 20 | Ht 64.0 in | Wt 145.9 lb

## 2012-06-21 DIAGNOSIS — C55 Malignant neoplasm of uterus, part unspecified: Secondary | ICD-10-CM

## 2012-06-21 DIAGNOSIS — D0590 Unspecified type of carcinoma in situ of unspecified breast: Secondary | ICD-10-CM

## 2012-06-21 DIAGNOSIS — C9110 Chronic lymphocytic leukemia of B-cell type not having achieved remission: Secondary | ICD-10-CM

## 2012-06-21 LAB — CBC WITH DIFFERENTIAL/PLATELET
BASO%: 1.9 % (ref 0.0–2.0)
Basophils Absolute: 0.1 10*3/uL (ref 0.0–0.1)
EOS%: 3.1 % (ref 0.0–7.0)
HGB: 11.3 g/dL — ABNORMAL LOW (ref 11.6–15.9)
MCH: 29.9 pg (ref 25.1–34.0)
MONO#: 0.3 10*3/uL (ref 0.1–0.9)
RDW: 14.7 % — ABNORMAL HIGH (ref 11.2–14.5)
WBC: 4.2 10*3/uL (ref 3.9–10.3)
lymph#: 2 10*3/uL (ref 0.9–3.3)

## 2012-06-21 NOTE — Telephone Encounter (Signed)
Gave patient for 07-06-2012  Moved patient scan to 07-05-2012

## 2012-06-22 ENCOUNTER — Ambulatory Visit: Payer: Medicare Other

## 2012-06-22 NOTE — Progress Notes (Signed)
Margaret Mann was seen in the office briefly today. She is currently receiving Rituxan/Treanda on a q. 28 day basis, and had been scheduled for treatment today error. Today is day 21 cycle 2, 1 week early for treatment.   We reviewed her labs together today which included a white count of 4.2; ANC 1.8; hemoglobin 11.3; hematocrit 33.9; and platelets slightly reduced at 104,000.  We have rescheduled Margaret Mann's appointments. She will return next week on December 19 to see Dr. Donnie Coffin and will be due that day to initiate day 1 cycle 3. She will have restaging CTs the following week on 12/26, and will return to review those results with Dr. Donnie Coffin on 12/27.  In light of our error in scheduling, the patient was not charge for today's visit.  Margaret Scale, PA-C 06/21/2012

## 2012-06-27 ENCOUNTER — Ambulatory Visit (HOSPITAL_COMMUNITY): Payer: Medicare Other

## 2012-06-28 ENCOUNTER — Other Ambulatory Visit (HOSPITAL_BASED_OUTPATIENT_CLINIC_OR_DEPARTMENT_OTHER): Payer: Medicare Other | Admitting: Lab

## 2012-06-28 ENCOUNTER — Ambulatory Visit (HOSPITAL_BASED_OUTPATIENT_CLINIC_OR_DEPARTMENT_OTHER): Payer: Medicare Other

## 2012-06-28 ENCOUNTER — Ambulatory Visit (HOSPITAL_BASED_OUTPATIENT_CLINIC_OR_DEPARTMENT_OTHER): Payer: Medicare Other | Admitting: Oncology

## 2012-06-28 VITALS — BP 106/66 | HR 103 | Temp 98.3°F | Resp 20 | Ht 64.0 in | Wt 145.0 lb

## 2012-06-28 VITALS — BP 105/53 | HR 78 | Temp 97.0°F | Resp 20

## 2012-06-28 DIAGNOSIS — C9110 Chronic lymphocytic leukemia of B-cell type not having achieved remission: Secondary | ICD-10-CM

## 2012-06-28 DIAGNOSIS — Z8542 Personal history of malignant neoplasm of other parts of uterus: Secondary | ICD-10-CM

## 2012-06-28 DIAGNOSIS — Z5112 Encounter for antineoplastic immunotherapy: Secondary | ICD-10-CM

## 2012-06-28 DIAGNOSIS — Z87898 Personal history of other specified conditions: Secondary | ICD-10-CM

## 2012-06-28 LAB — CBC WITH DIFFERENTIAL/PLATELET
BASO%: 1.3 % (ref 0.0–2.0)
Eosinophils Absolute: 0.1 10*3/uL (ref 0.0–0.5)
HCT: 33.1 % — ABNORMAL LOW (ref 34.8–46.6)
MCHC: 33.5 g/dL (ref 31.5–36.0)
MONO#: 0.3 10*3/uL (ref 0.1–0.9)
NEUT#: 1.5 10*3/uL (ref 1.5–6.5)
RBC: 3.81 10*6/uL (ref 3.70–5.45)
WBC: 4 10*3/uL (ref 3.9–10.3)
lymph#: 2 10*3/uL (ref 0.9–3.3)
nRBC: 0 % (ref 0–0)

## 2012-06-28 LAB — COMPREHENSIVE METABOLIC PANEL (CC13)
ALT: 72 U/L — ABNORMAL HIGH (ref 0–55)
CO2: 28 mEq/L (ref 22–29)
Calcium: 9.2 mg/dL (ref 8.4–10.4)
Chloride: 97 mEq/L — ABNORMAL LOW (ref 98–107)
Creatinine: 0.7 mg/dL (ref 0.6–1.1)
Glucose: 108 mg/dl — ABNORMAL HIGH (ref 70–99)
Total Bilirubin: 0.59 mg/dL (ref 0.20–1.20)
Total Protein: 6.3 g/dL — ABNORMAL LOW (ref 6.4–8.3)

## 2012-06-28 MED ORDER — ACETAMINOPHEN 325 MG PO TABS
650.0000 mg | ORAL_TABLET | Freq: Once | ORAL | Status: AC
  Administered 2012-06-28: 650 mg via ORAL

## 2012-06-28 MED ORDER — SODIUM CHLORIDE 0.9 % IV SOLN
90.0000 mg/m2 | Freq: Once | INTRAVENOUS | Status: AC
  Administered 2012-06-28: 155 mg via INTRAVENOUS
  Filled 2012-06-28: qty 31

## 2012-06-28 MED ORDER — SODIUM CHLORIDE 0.9 % IV SOLN
Freq: Once | INTRAVENOUS | Status: AC
  Administered 2012-06-28: 12:00:00 via INTRAVENOUS

## 2012-06-28 MED ORDER — DEXAMETHASONE SODIUM PHOSPHATE 10 MG/ML IJ SOLN
10.0000 mg | Freq: Once | INTRAMUSCULAR | Status: AC
  Administered 2012-06-28: 10 mg via INTRAVENOUS

## 2012-06-28 MED ORDER — ONDANSETRON 8 MG/50ML IVPB (CHCC)
8.0000 mg | Freq: Once | INTRAVENOUS | Status: AC
  Administered 2012-06-28: 8 mg via INTRAVENOUS

## 2012-06-28 MED ORDER — RITUXIMAB CHEMO INJECTION 10 MG/ML
375.0000 mg/m2 | Freq: Once | INTRAVENOUS | Status: AC
  Administered 2012-06-28: 600 mg via INTRAVENOUS
  Filled 2012-06-28: qty 60

## 2012-06-28 MED ORDER — DIPHENHYDRAMINE HCL 25 MG PO CAPS
50.0000 mg | ORAL_CAPSULE | Freq: Once | ORAL | Status: AC
  Administered 2012-06-28: 50 mg via ORAL

## 2012-06-28 NOTE — Progress Notes (Signed)
Hematology and Oncology Follow Up Visit  Margaret Mann [id number] Aug 05, 1932 78 y.o. 06/28/2012 11:16 AM   DIAGNOSIS:  PROBLEM LIST:   1. 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   2. History of stage IA endometrial cancer status post TAH and BSO on 09/02/2005 status post adjuvant chemotherapy and brachytherapy.  Completed treatments July 2010. 3. History of CLL on observation. Status post recent bone marrow biopsy, status post 2 cycles of rituxan/treanda day 1, cycle 4   PAST THERAPY:  As above  Interim History: she is doing well, she has no complaints. She denies fevers, her energy level is somewhat reduced. Medications: I have reviewed the patient's current medications.  Allergies: No Known Allergies  Past Medical History, Surgical history, Social history, and Family History were reviewed and updated.  Review of Systems: Constitutional:  Negative for fever, chills, night sweats, anorexia, weight loss, pain., she isweak the Wednesday following chemo. Cardiovascular: no chest pain or dyspnea on exertion Respiratory: no cough, shortness of breath, or wheezing Neurological: negative Dermatological: negative ENT: negative Skin Gastrointestinal: negative Genito-Urinary: negative Hematological and Lymphatic: negative Breast: negative Musculoskeletal: negative Remaining ROS negative.  Physical Exam:  Blood pressure 106/66, pulse 103, temperature 98.3 F (36.8 C), temperature source Oral, resp. rate 20, height 5\' 4"  (1.626 m), weight 145 lb (65.772 kg).  ECOG: 0 HEENT:  Sclerae anicteric, conjunctivae pink.  Oropharynx clear.  No mucositis or candidiasis.  Nodes:  No cervical, supraclavicular, or axillary lymphadenopathy palpated.  Breast Exam:  Right breast is benign.  No masses, discharge, skin change, or nipple inversion.  Left breast is benign.  No masses, discharge, skin  change, or nipple inversion..  Lungs:  Clear to auscultation bilaterally.  No crackles, rhonchi, or wheezes.  Heart:  Regular rate and rhythm.  Abdomen:  Soft, nontender.  Positive bowel sounds.  No organomegaly or masses palpated.  Musculoskeletal:  No focal spinal tenderness to palpation.  Extremities:  Benign.  No peripheral edema or cyanosis.  Skin:  Benign.  Neuro:  Nonfocal.      Lab Results: Lab Results  Component Value Date   WBC 4.0 06/28/2012   HGB 11.1* 06/28/2012   HCT 33.1* 06/28/2012   MCV 86.9 06/28/2012   PLT 213 06/28/2012     Chemistry      Component Value Date/Time   NA 138 06/12/2012 0930   NA 141 03/02/2012 0938   K 4.0 06/12/2012 0930   K 4.3 03/02/2012 0938   CL 98 06/12/2012 0930   CL 104 03/02/2012 0938   CO2 30* 06/12/2012 0930   CO2 25 03/02/2012 0938   BUN 11.0 06/12/2012 0930   BUN 24* 03/02/2012 0938   CREATININE 0.7 06/12/2012 0930   CREATININE 0.89 03/02/2012 0938      Component Value Date/Time   CALCIUM 9.6 06/12/2012 0930   CALCIUM 9.6 03/02/2012 0938   ALKPHOS 60 06/12/2012 0930   ALKPHOS 55 03/02/2012 0938   AST 21 06/12/2012 0930   AST 26 03/02/2012 0938   ALT 15 06/12/2012 0930   ALT 12 03/02/2012 0938   BILITOT 0.58 06/12/2012 0930   BILITOT 0.5 03/02/2012 8657       Radiological Studies:  No results found.   IMPRESSIONS AND PLAN: A 78 y.o. female with   History of DCIS having completed 5 years of tamoxifen. I recommended we stop that at this point. History of CLL, stage 0 on observation  since diagnosis was made. She is due for cycle 4 . Her wbc is 4.0  today which she is excited about. We plan on repeating a  PET after cycle 3.  She will return on 12/27, for Treanda alone. Overall excellent response to treatment. 30 minutes some  on counselling and treatment planning.  . She'll continue allopurinol and acyclovir. Pierce Crane M.D. Salvatore Marvel 12/19/201311:16 AM Cell [phone]

## 2012-06-28 NOTE — Patient Instructions (Addendum)
[facility] Discharge Instructions for Patients Receiving Chemotherapy  Today you received the following chemotherapy agents Rituxan and Treanda  To help prevent nausea and vomiting after your treatment, we encourage you to take your nausea medication.   If you develop nausea and vomiting that is not controlled by your nausea medication, call the clinic. If it is after clinic hours your family physician or the after hours number for the clinic or go to the Emergency Department.   BELOW ARE SYMPTOMS THAT SHOULD BE REPORTED IMMEDIATELY:  *FEVER GREATER THAN 100.5 F  *CHILLS WITH OR WITHOUT FEVER  NAUSEA AND VOMITING THAT IS NOT CONTROLLED WITH YOUR NAUSEA MEDICATION  *UNUSUAL SHORTNESS OF BREATH  *UNUSUAL BRUISING OR BLEEDING  TENDERNESS IN MOUTH AND THROAT WITH OR WITHOUT PRESENCE OF ULCERS  *URINARY PROBLEMS  *BOWEL PROBLEMS  UNUSUAL RASH Items with * indicate a potential emergency and should be followed up as soon as possible.  One of the nurses will contact you 24 hours after your treatment. Please let the nurse know about any problems that you may have experienced. Feel free to call the clinic you have any questions or concerns. The clinic phone number is [phone].   I have been informed and understand all the instructions given to me. I know to contact the clinic, my physician, or go to the Emergency Department if any problems should occur. I do not have any questions at this time, but understand that I may call the clinic during office hours   should I have any questions or need assistance in obtaining follow up care.    __________________________________________  _____________  __________ Signature of Patient or Authorized Representative            Date                   Time    __________________________________________ Nurse's Signature

## 2012-06-29 ENCOUNTER — Ambulatory Visit (HOSPITAL_BASED_OUTPATIENT_CLINIC_OR_DEPARTMENT_OTHER): Payer: Medicare Other

## 2012-06-29 VITALS — BP 91/57 | HR 57 | Temp 97.5°F

## 2012-06-29 DIAGNOSIS — C9110 Chronic lymphocytic leukemia of B-cell type not having achieved remission: Secondary | ICD-10-CM

## 2012-06-29 DIAGNOSIS — Z5111 Encounter for antineoplastic chemotherapy: Secondary | ICD-10-CM

## 2012-06-29 MED ORDER — DEXAMETHASONE SODIUM PHOSPHATE 10 MG/ML IJ SOLN
10.0000 mg | Freq: Once | INTRAMUSCULAR | Status: AC
  Administered 2012-06-29: 10 mg via INTRAVENOUS

## 2012-06-29 MED ORDER — ONDANSETRON 8 MG/50ML IVPB (CHCC)
8.0000 mg | Freq: Once | INTRAVENOUS | Status: AC
  Administered 2012-06-29: 8 mg via INTRAVENOUS

## 2012-06-29 MED ORDER — SODIUM CHLORIDE 0.9 % IV SOLN
90.0000 mg/m2 | Freq: Once | INTRAVENOUS | Status: AC
  Administered 2012-06-29: 155 mg via INTRAVENOUS
  Filled 2012-06-29: qty 31

## 2012-06-29 MED ORDER — SODIUM CHLORIDE 0.9 % IV SOLN
Freq: Once | INTRAVENOUS | Status: AC
  Administered 2012-06-29: 15:00:00 via INTRAVENOUS

## 2012-06-29 NOTE — Patient Instructions (Signed)
[facility] Discharge Instructions for Patients Receiving Chemotherapy  Today you received the following chemotherapy agents Treanda To help prevent nausea and vomiting after your treatment, we encourage you to take your nausea medication as prescribed.If you develop nausea and vomiting that is not controlled by your nausea medication, call the clinic. If it is after clinic hours your family physician or the after hours number for the clinic or go to the Emergency Department.   BELOW ARE SYMPTOMS THAT SHOULD BE REPORTED IMMEDIATELY:  *FEVER GREATER THAN 100.5 F  *CHILLS WITH OR WITHOUT FEVER  NAUSEA AND VOMITING THAT IS NOT CONTROLLED WITH YOUR NAUSEA MEDICATION  *UNUSUAL SHORTNESS OF BREATH  *UNUSUAL BRUISING OR BLEEDING  TENDERNESS IN MOUTH AND THROAT WITH OR WITHOUT PRESENCE OF ULCERS  *URINARY PROBLEMS  *BOWEL PROBLEMS  UNUSUAL RASH Items with * indicate a potential emergency and should be followed up as soon as possible.  One of the nurses will contact you 24 hours after your treatment. Please let the nurse know about any problems that you may have experienced. Feel free to call the clinic you have any questions or concerns. The clinic phone number is [phone].   I have been informed and understand all the instructions given to me. I know to contact the clinic, my physician, or go to the Emergency Department if any problems should occur. I do not have any questions at this time, but understand that I may call the clinic during office hours   should I have any questions or need assistance in obtaining follow up care.    __________________________________________  _____________  __________ Signature of Patient or Authorized Representative            Date                   Time    __________________________________________ Nurse's Signature    

## 2012-06-30 ENCOUNTER — Telehealth: Payer: Self-pay | Admitting: *Deleted

## 2012-06-30 NOTE — Telephone Encounter (Signed)
patient confirmed over the phone the new same date and different time at 3:30pm

## 2012-07-05 ENCOUNTER — Telehealth: Payer: Self-pay | Admitting: *Deleted

## 2012-07-05 ENCOUNTER — Ambulatory Visit (HOSPITAL_COMMUNITY)
Admission: RE | Admit: 2012-07-05 | Discharge: 2012-07-05 | Disposition: A | Discharge status: Home / Self Care | Payer: Medicare Other | Source: Ambulatory Visit | Attending: Oncology | Admitting: Oncology

## 2012-07-05 DIAGNOSIS — K8020 Calculus of gallbladder without cholecystitis without obstruction: Secondary | ICD-10-CM | POA: Insufficient documentation

## 2012-07-05 DIAGNOSIS — C9110 Chronic lymphocytic leukemia of B-cell type not having achieved remission: Secondary | ICD-10-CM | POA: Insufficient documentation

## 2012-07-05 MED ORDER — IOHEXOL 300 MG/ML  SOLN
100.0000 mL | Freq: Once | INTRAMUSCULAR | Status: AC | PRN
  Administered 2012-07-05: 100 mL via INTRAVENOUS

## 2012-07-05 NOTE — Telephone Encounter (Signed)
former patient of dr.[email or web address] re-establishing to dr.[email or web address]

## 2012-07-06 ENCOUNTER — Other Ambulatory Visit: Payer: Medicare Other | Admitting: Lab

## 2012-07-06 ENCOUNTER — Ambulatory Visit (HOSPITAL_BASED_OUTPATIENT_CLINIC_OR_DEPARTMENT_OTHER): Payer: Medicare Other | Admitting: Oncology

## 2012-07-06 ENCOUNTER — Ambulatory Visit: Payer: Medicare Other | Admitting: Oncology

## 2012-07-06 ENCOUNTER — Other Ambulatory Visit (HOSPITAL_BASED_OUTPATIENT_CLINIC_OR_DEPARTMENT_OTHER): Payer: Medicare Other | Admitting: Lab

## 2012-07-06 VITALS — BP 103/67 | HR 106 | Temp 98.2°F | Resp 20 | Ht 64.0 in | Wt 142.4 lb

## 2012-07-06 DIAGNOSIS — C9110 Chronic lymphocytic leukemia of B-cell type not having achieved remission: Secondary | ICD-10-CM

## 2012-07-06 DIAGNOSIS — Z87898 Personal history of other specified conditions: Secondary | ICD-10-CM

## 2012-07-06 LAB — CBC WITH DIFFERENTIAL/PLATELET
BASO%: 0.4 % (ref 0.0–2.0)
EOS%: 1.5 % (ref 0.0–7.0)
Eosinophils Absolute: 0.1 10*3/uL (ref 0.0–0.5)
MCHC: 33.4 g/dL (ref 31.5–36.0)
MCV: 89.5 fL (ref 79.5–101.0)
MONO%: 10.1 % (ref 0.0–14.0)
NEUT#: 0.4 10*3/uL — CL (ref 1.5–6.5)
RBC: 3.99 10*6/uL (ref 3.70–5.45)
RDW: 15.7 % — ABNORMAL HIGH (ref 11.2–14.5)

## 2012-07-06 LAB — COMPREHENSIVE METABOLIC PANEL (CC13)
ALT: 23 U/L (ref 0–55)
AST: 28 U/L (ref 5–34)
Albumin: 2.9 g/dL — ABNORMAL LOW (ref 3.5–5.0)
Alkaline Phosphatase: 77 U/L (ref 40–150)
Potassium: 3.2 mEq/L — ABNORMAL LOW (ref 3.5–5.1)
Sodium: 136 mEq/L (ref 136–145)
Total Bilirubin: 0.48 mg/dL (ref 0.20–1.20)
Total Protein: 5.6 g/dL — ABNORMAL LOW (ref 6.4–8.3)

## 2012-07-06 MED ORDER — POTASSIUM CHLORIDE CRYS ER 20 MEQ PO TBCR: 20.0000 meq | EXTENDED_RELEASE_TABLET | Freq: Two times a day (BID) | ORAL | Status: DC

## 2012-07-06 MED ORDER — LEVOFLOXACIN 500 MG PO TABS: 500.0000 mg | ORAL_TABLET | Freq: Every day | ORAL | Status: DC

## 2012-07-06 NOTE — Progress Notes (Signed)
Hematology and Oncology Follow Up Visit  Margaret Mann [id number] [birth year] 78 y.o. 07/06/2012 4:54 PM   DIAGNOSIS:  PROBLEM LIST:   1. 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   2. History of stage IA endometrial cancer status post TAH and BSO on 09/02/2005 status post adjuvant chemotherapy and brachytherapy.  Completed treatments July 2010. 3. History of CLL on observation. Status post recent bone marrow biopsy, status post 2 cycles of rituxan/treanda day 1, cycle 4   PAST THERAPY:  As above  Interim History: she is doing well, she has no complaints. She denies fevers, her energy level is somewhat reduced. Medications: I have reviewed the patient's current medications.  Allergies: No Known Allergies  Past Medical History, Surgical history, Social history, and Family History were reviewed and updated.  Review of Systems: Constitutional:  Negative for fever, chills, night sweats, anorexia, weight loss, pain., she isweak the Wednesday following chemo. Cardiovascular: no chest pain or dyspnea on exertion Respiratory: no cough, shortness of breath, or wheezing Neurological: negative Dermatological: negative ENT: negative Skin Gastrointestinal: negative Genito-Urinary: negative Hematological and Lymphatic: negative Breast: negative Musculoskeletal: negative Remaining ROS negative.  Physical Exam:  Blood pressure 103/67, pulse 106, temperature 98.2 F (36.8 C), temperature source Oral, resp. rate 20, height 5\' 4"  (1.626 m), weight 142 lb 6.4 oz (64.592 kg).  ECOG: 0 HEENT:  Sclerae anicteric, conjunctivae pink.  Oropharynx clear.  No mucositis or candidiasis.  Nodes:  No cervical, supraclavicular, or axillary lymphadenopathy palpated.  Breast Exam:  Right breast is benign.  No masses, discharge, skin change, or nipple inversion.  Left breast is benign.  No masses, discharge, skin  change, or nipple inversion..  Lungs:  Clear to auscultation bilaterally.  No crackles, rhonchi, or wheezes.  Heart:  Regular rate and rhythm.  Abdomen:  Soft, nontender.  Positive bowel sounds.  No organomegaly or masses palpated.  Musculoskeletal:  No focal spinal tenderness to palpation.  Extremities:  Benign.  No peripheral edema or cyanosis.  Skin:  Benign.  Neuro:  Nonfocal.      Lab Results: Lab Results  Component Value Date   WBC 3.9 07/06/2012   HGB 11.9 07/06/2012   HCT 35.7 07/06/2012   MCV 89.5 07/06/2012   PLT 262 07/06/2012     Chemistry      Component Value Date/Time   NA 136 07/06/2012 1446   NA 141 03/02/2012 0938   K 3.2* 07/06/2012 1446   K 4.3 03/02/2012 0938   CL 95* 07/06/2012 1446   CL 104 03/02/2012 0938   CO2 31* 07/06/2012 1446   CO2 25 03/02/2012 0938   BUN 15.0 07/06/2012 1446   BUN 24* 03/02/2012 0938   CREATININE 0.8 07/06/2012 1446   CREATININE 0.89 03/02/2012 0938      Component Value Date/Time   CALCIUM 8.8 07/06/2012 1446   CALCIUM 9.6 03/02/2012 0938   ALKPHOS 77 07/06/2012 1446   ALKPHOS 55 03/02/2012 0938   AST 28 07/06/2012 1446   AST 26 03/02/2012 0938   ALT 23 07/06/2012 1446   ALT 12 03/02/2012 0938   BILITOT 0.48 07/06/2012 1446   BILITOT 0.5 03/02/2012 0938       Radiological Studies:  No results found.   IMPRESSIONS AND PLAN: A 78 y.o. female with   History of DCIS having completed 5 years of tamoxifen. I recommended we stop that at this point. History of CLL, stage 0  on observation since diagnosis was made. She is due for cycle 4 treanda/rituxan . Her wbc is 3.9 with anc of 0.4  Today. Overall excellent response to treatment. We spent time reviewing her scans which shows decrease of dominant mass to 3.6 cm from ~ 9 cm.  She will see dr Cyndie Chime before her next visit.I have given her a script for levaquin given her low ANC.  30 minutes some  on counselling and treatment planning.  She'll continue allopurinol and  acyclovir. Margaret Mann M.D. Margaret Mann 12/27/20134:54 PM Cell [phone]

## 2012-07-12 ENCOUNTER — Other Ambulatory Visit: Payer: Self-pay | Admitting: Oncology

## 2012-07-12 ENCOUNTER — Telehealth: Payer: Self-pay | Admitting: *Deleted

## 2012-07-12 NOTE — Telephone Encounter (Signed)
Per staff message I have scheduled appts. JMW  

## 2012-07-13 ENCOUNTER — Telehealth: Payer: Self-pay | Admitting: Oncology

## 2012-07-13 NOTE — Telephone Encounter (Signed)
S/w pt re appts for 1/14, 1/15 and 1/22. Pt will see Margaret Mann 1/22 and be given more orders at that time. Margaret Mann aware pt being tx'd wk of 1/14.

## 2012-07-16 ENCOUNTER — Telehealth: Payer: Self-pay | Admitting: Oncology

## 2012-07-16 NOTE — Telephone Encounter (Signed)
Per pt's care plan next tx should be 1/16 and 1/17. Moved tx from 1/14 and 1/15 to 1/16 and 1/17. S/w pt re change and new appts for 1/16 and 1/17. Also confirmed 1/22 appt w/JG.

## 2012-07-24 ENCOUNTER — Ambulatory Visit: Payer: Medicare Other | Admitting: Oncology

## 2012-07-24 ENCOUNTER — Ambulatory Visit: Payer: Medicare Other

## 2012-07-24 ENCOUNTER — Other Ambulatory Visit: Payer: Medicare Other | Admitting: Lab

## 2012-07-25 ENCOUNTER — Ambulatory Visit: Payer: Medicare Other

## 2012-07-26 ENCOUNTER — Other Ambulatory Visit (HOSPITAL_BASED_OUTPATIENT_CLINIC_OR_DEPARTMENT_OTHER): Payer: Medicare Other | Admitting: Lab

## 2012-07-26 ENCOUNTER — Other Ambulatory Visit: Payer: Self-pay | Admitting: Oncology

## 2012-07-26 ENCOUNTER — Ambulatory Visit (HOSPITAL_BASED_OUTPATIENT_CLINIC_OR_DEPARTMENT_OTHER): Payer: Medicare Other

## 2012-07-26 ENCOUNTER — Ambulatory Visit: Payer: Medicare Other

## 2012-07-26 VITALS — BP 111/64 | HR 72 | Temp 97.5°F | Resp 18

## 2012-07-26 DIAGNOSIS — C9110 Chronic lymphocytic leukemia of B-cell type not having achieved remission: Secondary | ICD-10-CM

## 2012-07-26 DIAGNOSIS — Z5112 Encounter for antineoplastic immunotherapy: Secondary | ICD-10-CM

## 2012-07-26 LAB — CBC WITH DIFFERENTIAL/PLATELET
BASO%: 0.9 % (ref 0.0–2.0)
HCT: 35.5 % (ref 34.8–46.6)
LYMPH%: 43.8 % (ref 14.0–49.7)
MCHC: 33 g/dL (ref 31.5–36.0)
MCV: 86.8 fL (ref 79.5–101.0)
MONO%: 15.2 % — ABNORMAL HIGH (ref 0.0–14.0)
NEUT%: 37.6 % — ABNORMAL LOW (ref 38.4–76.8)
Platelets: 214 10*3/uL (ref 145–400)
RBC: 4.09 10*6/uL (ref 3.70–5.45)
nRBC: 0 % (ref 0–0)

## 2012-07-26 LAB — COMPREHENSIVE METABOLIC PANEL (CC13)
AST: 25 U/L (ref 5–34)
Albumin: 3.6 g/dL (ref 3.5–5.0)
Alkaline Phosphatase: 78 U/L (ref 40–150)
BUN: 11 mg/dL (ref 7.0–26.0)
Calcium: 9.5 mg/dL (ref 8.4–10.4)
Chloride: 102 mEq/L (ref 98–107)
Creatinine: 0.7 mg/dL (ref 0.6–1.1)
Glucose: 117 mg/dl — ABNORMAL HIGH (ref 70–99)
Potassium: 4.1 mEq/L (ref 3.5–5.1)

## 2012-07-26 MED ORDER — SODIUM CHLORIDE 0.9 % IV SOLN
Freq: Once | INTRAVENOUS | Status: AC
Start: 2012-07-26 — End: 2012-07-26
  Administered 2012-07-26: 12:00:00 via INTRAVENOUS

## 2012-07-26 MED ORDER — SODIUM CHLORIDE 0.9 % IV SOLN
70.0000 mg/m2 | Freq: Once | INTRAVENOUS | Status: AC
  Administered 2012-07-26: 120 mg via INTRAVENOUS
  Filled 2012-07-26: qty 24

## 2012-07-26 MED ORDER — DEXAMETHASONE SODIUM PHOSPHATE 10 MG/ML IJ SOLN
10.0000 mg | Freq: Once | INTRAMUSCULAR | Status: AC
  Administered 2012-07-26: 10 mg via INTRAVENOUS

## 2012-07-26 MED ORDER — ONDANSETRON 8 MG/50ML IVPB (CHCC)
8.0000 mg | Freq: Once | INTRAVENOUS | Status: AC
  Administered 2012-07-26: 8 mg via INTRAVENOUS

## 2012-07-26 MED ORDER — SODIUM CHLORIDE 0.9 % IV SOLN
375.0000 mg/m2 | Freq: Once | INTRAVENOUS | Status: AC
  Administered 2012-07-26: 600 mg via INTRAVENOUS
  Filled 2012-07-26: qty 60

## 2012-07-26 MED ORDER — DIPHENHYDRAMINE HCL 25 MG PO CAPS
50.0000 mg | ORAL_CAPSULE | Freq: Once | ORAL | Status: AC
  Administered 2012-07-26: 50 mg via ORAL

## 2012-07-26 MED ORDER — ACETAMINOPHEN 325 MG PO TABS
650.0000 mg | ORAL_TABLET | Freq: Once | ORAL | Status: AC
  Administered 2012-07-26: 650 mg via ORAL

## 2012-07-26 NOTE — Progress Notes (Signed)
1040 Labs reviewed by Dr. Michelene Heady to treat today.

## 2012-07-26 NOTE — Patient Instructions (Addendum)
[facility] Discharge Instructions for Patients Receiving Chemotherapy  Today you received the following chemotherapy agents Rituxan/Treanda  To help prevent nausea and vomiting after your treatment, we encourage you to take your nausea medication as prescribed.   If you develop nausea and vomiting that is not controlled by your nausea medication, call the clinic. If it is after clinic hours your family physician or the after hours number for the clinic or go to the Emergency Department.   BELOW ARE SYMPTOMS THAT SHOULD BE REPORTED IMMEDIATELY:  *FEVER GREATER THAN 100.5 F  *CHILLS WITH OR WITHOUT FEVER  NAUSEA AND VOMITING THAT IS NOT CONTROLLED WITH YOUR NAUSEA MEDICATION  *UNUSUAL SHORTNESS OF BREATH  *UNUSUAL BRUISING OR BLEEDING  TENDERNESS IN MOUTH AND THROAT WITH OR WITHOUT PRESENCE OF ULCERS  *URINARY PROBLEMS  *BOWEL PROBLEMS  UNUSUAL RASH Items with * indicate a potential emergency and should be followed up as soon as possible.  Feel free to call the clinic you have any questions or concerns. The clinic phone number is [phone].   I have been informed and understand all the instructions given to me. I know to contact the clinic, my physician, or go to the Emergency Department if any problems should occur. I do not have any questions at this time, but understand that I may call the clinic during office hours   should I have any questions or need assistance in obtaining follow up care.    __________________________________________  _____________  __________ Signature of Patient or Authorized Representative            Date                   Time    __________________________________________ Nurse's Signature    

## 2012-07-27 ENCOUNTER — Ambulatory Visit (HOSPITAL_BASED_OUTPATIENT_CLINIC_OR_DEPARTMENT_OTHER): Payer: Medicare Other

## 2012-07-27 ENCOUNTER — Ambulatory Visit: Payer: Medicare Other

## 2012-07-27 DIAGNOSIS — C9110 Chronic lymphocytic leukemia of B-cell type not having achieved remission: Secondary | ICD-10-CM

## 2012-07-27 DIAGNOSIS — Z5111 Encounter for antineoplastic chemotherapy: Secondary | ICD-10-CM

## 2012-07-27 MED ORDER — SODIUM CHLORIDE 0.9 % IV SOLN
Freq: Once | INTRAVENOUS | Status: AC
  Administered 2012-07-27: 13:00:00 via INTRAVENOUS

## 2012-07-27 MED ORDER — SODIUM CHLORIDE 0.9 % IV SOLN
70.0000 mg/m2 | Freq: Once | INTRAVENOUS | Status: AC
  Administered 2012-07-27: 120 mg via INTRAVENOUS
  Filled 2012-07-27: qty 24

## 2012-07-27 MED ORDER — DEXAMETHASONE SODIUM PHOSPHATE 10 MG/ML IJ SOLN
10.0000 mg | Freq: Once | INTRAMUSCULAR | Status: AC
  Administered 2012-07-27: 10 mg via INTRAVENOUS

## 2012-07-27 MED ORDER — ONDANSETRON 8 MG/50ML IVPB (CHCC)
8.0000 mg | Freq: Once | INTRAVENOUS | Status: AC
  Administered 2012-07-27: 8 mg via INTRAVENOUS

## 2012-07-27 NOTE — Patient Instructions (Addendum)
[facility] Discharge Instructions for Patients Receiving Chemotherapy  Today you received the following chemotherapy agents: treanda  To help prevent nausea and vomiting after your treatment, we encourage you to take your nausea medication.  Take it as often as prescribed.     If you develop nausea and vomiting that is not controlled by your nausea medication, call the clinic. If it is after clinic hours your family physician or the after hours number for the clinic or go to the Emergency Department.   BELOW ARE SYMPTOMS THAT SHOULD BE REPORTED IMMEDIATELY:  *FEVER GREATER THAN 100.5 F  *CHILLS WITH OR WITHOUT FEVER  NAUSEA AND VOMITING THAT IS NOT CONTROLLED WITH YOUR NAUSEA MEDICATION  *UNUSUAL SHORTNESS OF BREATH  *UNUSUAL BRUISING OR BLEEDING  TENDERNESS IN MOUTH AND THROAT WITH OR WITHOUT PRESENCE OF ULCERS  *URINARY PROBLEMS  *BOWEL PROBLEMS  UNUSUAL RASH Items with * indicate a potential emergency and should be followed up as soon as possible.  Feel free to call the clinic you have any questions or concerns. The clinic phone number is [phone].   I have been informed and understand all the instructions given to me. I know to contact the clinic, my physician, or go to the Emergency Department if any problems should occur. I do not have any questions at this time, but understand that I may call the clinic during office hours   should I have any questions or need assistance in obtaining follow up care.    __________________________________________  _____________  __________ Signature of Patient or Authorized Representative            Date                   Time    __________________________________________ Nurse's Signature    

## 2012-07-27 NOTE — Progress Notes (Signed)
2:00 Pt reported seeing some swelling at IV site.  Infusion stopped - blood return noted, flushed with no problem.  IV site near wrist bone - no swelling observed.  Pt reports no pain, discomfort or burning.   Infusion restarted.

## 2012-08-01 ENCOUNTER — Ambulatory Visit (HOSPITAL_BASED_OUTPATIENT_CLINIC_OR_DEPARTMENT_OTHER): Payer: Medicare Other | Admitting: Oncology

## 2012-08-01 ENCOUNTER — Telehealth: Payer: Self-pay | Admitting: Oncology

## 2012-08-01 ENCOUNTER — Telehealth: Payer: Self-pay | Admitting: *Deleted

## 2012-08-01 VITALS — BP 118/60 | HR 95 | Temp 98.1°F | Resp 20 | Ht 64.0 in | Wt 144.0 lb

## 2012-08-01 DIAGNOSIS — C9110 Chronic lymphocytic leukemia of B-cell type not having achieved remission: Secondary | ICD-10-CM

## 2012-08-01 DIAGNOSIS — Z8542 Personal history of malignant neoplasm of other parts of uterus: Secondary | ICD-10-CM

## 2012-08-01 DIAGNOSIS — Z87898 Personal history of other specified conditions: Secondary | ICD-10-CM

## 2012-08-01 NOTE — Progress Notes (Signed)
Hematology and Oncology Follow Up Visit  Margaret Mann [id number] 1932/09/24 79 y.o. 08/01/2012 12:51 PM   Principle Diagnosis: Encounter Diagnosis  Name Primary?  . CLL (chronic lymphocytic leukemia) Yes     Interim History:  I will be assuming the oncologic care for this pleasant 79 year old woman with 3 primary malignancies: breast, endometrial, and chronic lymphocytic leukemia.  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 She underwent a lumpectomy 07/12/2006. She received postoperative radiation and was then put on tamoxifen. At time of evaluation for the breast cancer she had a prominent right axillary lymph node. Biopsy revealed a infiltrate of mature lymphocytes consistent with chronic leukocytic leukemia versus well-differentiated lymphocytic lymphoma. Lab at that time on 10/11/2006 with hemoglobin 14 hematocrit 39%, white count 8800 with 62% neutrophils 31% lymphocytes and platelet count 318,000.  She presented with vaginal bleeding/spotting in January 2010. Endometrial biopsy done 07/25/2008 revealed endometrioid adenocarcinoma with clear cell features. She was referred to Dr. Madaline Guthrie, gynecologic oncologist at [facility], she underwent a robotic hysterectomy with bilateral salpingo-oophorectomy on 09/02/2008. She received postoperative adjuvant chemotherapy with carboplatinum plus Taxol. She then received brachytherapy radiation to the vaginal cuff between May 13 and 11/27/2008.  Her CLL was treated with observation alone until her white count began to rise rather rapidly from 19,000 in May 2012 to 52,000 by July 2013 with concomitant fall in hemoglobin to 11. Bone marrow biopsy was done 01/31/2012 and showed extensive involvement with CLL in a diffuse pattern. A PET scan was done on 03/06/2012 which showed a dominant 8.7 x 7.8 cm area of  lymphadenopathy within the small bowel mesentery on the right. Additional smaller lymph nodes none larger than 1.2 cm seen in the neck, left supraclavicular region, left axillary region. Multiple Lymph nodes in the left periaortic region up to 2.1 cm. All of these areas have low metabolic uptake with maximum uptake over the area of dominant adenopathy in the mesentery of 4.6.  In view of her history of endometrial cancer, a needle aspiration biopsy of the dominant lymph node mass was done on 04/12/2012. Findings were consistent with CLL and not recurrent endometrial cancer.  By that time her white count had risen to 82,000. Bone marrow biopsy showed a poor prognosis cytogenetic change with a deletion in chromosome 17. She was started on chemotherapy with a combination of Treanda  plus Rituxan beginning October 24 and 05/04/2012. She has had 4 cycles to date most recent January 16 and 07/27/2012. She is tolerating therapy extremely well. Treanda was given at 90 mg per meter squared day 1 and day 2 for the first 3 cycles. I made an arbitrary 20% dose reduction down to 70 mg per meter squared going into cycle 4 when total white count got down to 3900 with absolute neutrophils of 400 on 07/06/2012. A CT scan done 07/05/12 ,which I personally reviewed ,shows that the dominant lymph node mass in the small bowel mesentery has reduced in size to 3.6 cm. All other lymph nodes which were normal sized to begin with, are slightly less prominent on the current study. The spleen is not enlarged. Incidentally noted are gallstones.    Summary of her past medical history reveals minimal additional medical problems. She had an episode of congestive heart failure, etiology unclear, back in 2004. She was told she did not have an MI or an arrhythmia. She has hypertension  controlled with low dose single agent hydrochlorothiazide 25 mg. She is a nonsmoker. 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  There is no prior history of hepatitis, yellow jaundice, malaria, blood sugars have been borderline elevated not requiring medication, no ulcers, no thyroid disease, no seizure, no stroke.  Social history: She lost her husband about one year ago to complications of coronary disease and obstructive airway disease. She has no children. She lost one sister with liver cancer. Another sister alive and well age 8 living on the [place] she used to work for [employer]. She enjoys playing the piano and organ at her church.  Medications: reviewed  Allergies: No Known Allergies  Review of Systems: Constitutional:   No constitutional symptoms Respiratory: No cough or dyspnea Cardiovascular: No chest pain, pressure, or palpitations  Gastrointestinal: No abdominal pain or change in bowel habit Genito-Urinary: No vaginal bleeding Musculoskeletal: Some minor arthritis complaints in her thumbs no other joints Neurologic: No headache or change in vision Skin: She has developed ecchymoses on her skin Remaining ROS negative.  Physical Exam: Blood pressure 118/60, pulse 95, temperature 98.1 F (36.7 C), temperature source Oral, resp. rate 20, height 5\' 4"  (1.626 m), weight 144 lb (65.318 kg). Wt Readings from Last 3 Encounters:  08/01/12 144 lb (65.318 kg)  07/06/12 142 lb 6.4 oz (64.592 kg)  06/28/12 145 lb (65.772 kg)     General appearance: Well-nourished Caucasian woman HENNT: Pharynx no erythema, exudate, or ulcer. Tonsils are atrophied Lymph nodes: No cervical, supraclavicular, axillary, or inguinal lymphadenopathy Breasts: Scars in both breasts from previous biopsies. No dominant masses. Inversion of the right nipple which she says is chronic Lungs: Clear to auscultation resonant to percussion Heart: Regular rhythm no murmur or gallop Abdomen: Soft, nontender, no mass, no organomegaly Extremities: No edema, no calf  tenderness Vascular: No cyanosis Neurologic: Alert and oriented, PERRLA, motor strength 5 over 5, reflexes absent symmetric at the knees, 1+ symmetric at the biceps Skin: Scattered ecchymoses and thin skin over the dorsum of her hands  Lab Results: Lab Results  Component Value Date   WBC 6.4 07/26/2012   HGB 11.7 07/26/2012   HCT 35.5 07/26/2012   MCV 86.8 07/26/2012   PLT 214 07/26/2012     Chemistry      Component Value Date/Time   NA 138 07/26/2012 1020   NA 141 03/02/2012 0938   K 4.1 07/26/2012 1020   K 4.3 03/02/2012 0938   CL 102 07/26/2012 1020   CL 104 03/02/2012 0938   CO2 24 07/26/2012 1020   CO2 25 03/02/2012 0938   BUN 11.0 07/26/2012 1020   BUN 24* 03/02/2012 0938   CREATININE 0.7 07/26/2012 1020   CREATININE 0.89 03/02/2012 0938      Component Value Date/Time   CALCIUM 9.5 07/26/2012 1020   CALCIUM 9.6 03/02/2012 0938   ALKPHOS 78 07/26/2012 1020   ALKPHOS 55 03/02/2012 0938   AST 25 07/26/2012 1020   AST 26 03/02/2012 0938   ALT 12 07/26/2012 1020   ALT 12 03/02/2012 0938   BILITOT 0.57 07/26/2012 1020   BILITOT 0.5 03/02/2012 1610       Radiological Studies: Ct Chest W Contrast  07/05/2012  *RADIOLOGY REPORT*  Clinical Data:  History of CLL, endometrial cancer and breast cancer.  CT CHEST, ABDOMEN AND PELVIS WITH CONTRAST  Technique:  Multidetector CT imaging of  the chest, abdomen and pelvis was performed following the standard protocol during bolus administration of intravenous contrast.  Contrast: OMNIPAQUE IOHEXOL 300 MG/ML  SOLN  Comparison:  PET CT 03/06/2012.  CT CHEST  Findings:  Mediastinum: Borderline enlarged left supraclavicular lymph nodes appear slightly smaller than the prior examination, measuring up to 9 mm in short axis. No pathologically enlarged mediastinal, internal mammary or hilar lymph nodes. Heart size is normal. There is no significant pericardial fluid, thickening or pericardial calcification.  Esophagus is unremarkable in appearance.   Lungs/Pleura: Linear scarring with some mild cylindrical and varicose bronchiectasis in the apex of the right upper lobe is unchanged.  No suspicious appearing pulmonary nodules or masses are identified.  No acute consolidative airspace disease.  No pleural effusions.  Musculoskeletal: Two small clips in the left breast may be related to prior lumpectomy.  No definite focal soft tissue masses in the breasts.  No definite pathologically enlarged axillary or subpectoral lymph nodes are appreciated.  Several borderline enlarged axillary lymph nodes are noted bilaterally.  IMPRESSION:  1.  Slight decrease in size in the left supraclavicular lymphadenopathy, and resolution of previously noted left axillary lymph node enlargement compared to the prior examination. 2.  Unchanged scarring in the apex of the right upper lobe.  CT ABDOMEN AND PELVIS  Findings:  Abdomen/Pelvis: The appearance of the liver, pancreas, spleen and bilateral adrenal glands is unremarkable.  Small calcified gallstones in the dependent portion of the gallbladder, without findings to suggest acute cholecystitis at this time.  Multiple tiny subcentimeter low attenuation lesions in the kidneys bilaterally are too small to definitively characterize, but are statistically likely to represent tiny cysts.  Numerous to borderline enlarged and mildly enlarged retroperitoneal lymph nodes are noted, however, these generally appears smaller than the prior examination.  The largest nodes in the abdomen and pelvis are within the small bowel mesentery, and these both appear smaller than the prior study, with the largest single nodal mass measuring up to 3.6 cm in short axis which is considerably smaller than the prior study (direct comparison in measurements is difficult due to the irregular shape of this lesion and different position of the mass when compared to the prior study).  No ascites or pneumoperitoneum and no pathologic distension of small bowel. Status  post hysterectomy.  Ovaries are not confidently identified and may be surgically absent, or may be atrophic.  Musculoskeletal: There are no aggressive appearing lytic or blastic lesions noted in the visualized portions of the skeleton.  IMPRESSION:  1.  Today's study demonstrates a significant positive response to therapy with decreased size of mesenteric and retroperitoneal lymphadenopathy, as discussed above. 2.  Cholelithiasis without findings to suggest acute cholecystitis at this time. 3.  Multiple subcentimeter low attenuation renal lesions bilaterally too small to definitively characterize, but are statistically likely to represent small cysts.   Original Report Authenticated By: Trudie Reed, M.D.    Ct Abdomen Pelvis W Contrast  07/05/2012  *RADIOLOGY REPORT*  Clinical Data:  History of CLL, endometrial cancer and breast cancer.  CT CHEST, ABDOMEN AND PELVIS WITH CONTRAST  Technique:  Multidetector CT imaging of the chest, abdomen and pelvis was performed following the standard protocol during bolus administration of intravenous contrast.  Contrast: OMNIPAQUE IOHEXOL 300 MG/ML  SOLN  Comparison:  PET CT 03/06/2012.  CT CHEST  Findings:  Mediastinum: Borderline enlarged left supraclavicular lymph nodes appear slightly smaller than the prior examination, measuring up to 9 mm in short axis. No  pathologically enlarged mediastinal, internal mammary or hilar lymph nodes. Heart size is normal. There is no significant pericardial fluid, thickening or pericardial calcification.  Esophagus is unremarkable in appearance.  Lungs/Pleura: Linear scarring with some mild cylindrical and varicose bronchiectasis in the apex of the right upper lobe is unchanged.  No suspicious appearing pulmonary nodules or masses are identified.  No acute consolidative airspace disease.  No pleural effusions.  Musculoskeletal: Two small clips in the left breast may be related to prior lumpectomy.  No definite focal soft tissue  masses in the breasts.  No definite pathologically enlarged axillary or subpectoral lymph nodes are appreciated.  Several borderline enlarged axillary lymph nodes are noted bilaterally.  IMPRESSION:  1.  Slight decrease in size in the left supraclavicular lymphadenopathy, and resolution of previously noted left axillary lymph node enlargement compared to the prior examination. 2.  Unchanged scarring in the apex of the right upper lobe.  CT ABDOMEN AND PELVIS  Findings:  Abdomen/Pelvis: The appearance of the liver, pancreas, spleen and bilateral adrenal glands is unremarkable.  Small calcified gallstones in the dependent portion of the gallbladder, without findings to suggest acute cholecystitis at this time.  Multiple tiny subcentimeter low attenuation lesions in the kidneys bilaterally are too small to definitively characterize, but are statistically likely to represent tiny cysts.  Numerous to borderline enlarged and mildly enlarged retroperitoneal lymph nodes are noted, however, these generally appears smaller than the prior examination.  The largest nodes in the abdomen and pelvis are within the small bowel mesentery, and these both appear smaller than the prior study, with the largest single nodal mass measuring up to 3.6 cm in short axis which is considerably smaller than the prior study (direct comparison in measurements is difficult due to the irregular shape of this lesion and different position of the mass when compared to the prior study).  No ascites or pneumoperitoneum and no pathologic distension of small bowel. Status post hysterectomy.  Ovaries are not confidently identified and may be surgically absent, or may be atrophic.  Musculoskeletal: There are no aggressive appearing lytic or blastic lesions noted in the visualized portions of the skeleton.  IMPRESSION:  1.  Today's study demonstrates a significant positive response to therapy with decreased size of mesenteric and retroperitoneal  lymphadenopathy, as discussed above. 2.  Cholelithiasis without findings to suggest acute cholecystitis at this time. 3.  Multiple subcentimeter low attenuation renal lesions bilaterally too small to definitively characterize, but are statistically likely to represent small cysts.   Original Report Authenticated By: Trudie Reed, M.D.     Impression and Plan:  #1. RAI stage I,  chronic lymphocytic leukemia with deletion 17 cytogenetics She is responding well to a combination of Treanda and Rituxan with normalization of her total white count and improvement in her white count differential now with 38% neutrophils. Hemoglobin remains stable at 12 g. Platelets normal at 214,000. We discussed the fact that we don't know what the optimal number of cycles of treatment should be. Traditionally, if people have tolerated treatment well, we have given 6 cycles. Plan: I plan to proceed with 2 additional cycles of treatment with the slightly reduced dose of Treanda  at 70 mg per meter squared day 1 and day 2 of a 28 day cycle and the same dose of Rituxan given on day 1 only.  #2. 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 No evidence for recurrence. She will continue followup with Dr.  Skinner.  #3. DCIS right breast treated as outlined above. No signs of recurrence. Next mammogram due in August 2013  #4. History of idiopathic CHF currently asymptomatic on low-dose diuretic.  #5. Essential hypertension    CC:. Dr. Maurice Small; Dr. Madaline Guthrie; Dr. Maryfrances Bunnell; [facility]; Dr. Rollene Rotunda   Levert Feinstein, MD 1/22/201412:51 PM

## 2012-08-01 NOTE — Telephone Encounter (Signed)
Gave pt appt for lab and chemo for 2/13 and 2/14 ,emailed [place] regarding chemo, pt will see Md on 2/28/14with labs

## 2012-08-01 NOTE — Telephone Encounter (Signed)
Per staff message and POF I have scheduled appts.  JMW  

## 2012-08-01 NOTE — Patient Instructions (Signed)
Treatment # 5:  Rituxan/Treanda 2/13; Treanda alone 2/14 Lab day of Rx Repeat lab and see Dr Reece Agar 2/28 @ 10:30 AM

## 2012-08-02 ENCOUNTER — Telehealth: Payer: Self-pay | Admitting: Oncology

## 2012-08-02 NOTE — Telephone Encounter (Signed)
Talked to patient and gave her appt for February 2014 lab and chemo

## 2012-08-03 ENCOUNTER — Telehealth: Payer: Self-pay | Admitting: *Deleted

## 2012-08-03 NOTE — Telephone Encounter (Signed)
Received vm call from pt stating that she had a scan done 07/05/12 & would like a CD to go to Dr. Madaline Guthrie @ [facility] [address] [place], [place] [address]. Called radiology & requested CD to be mailed per [place].

## 2012-08-14 ENCOUNTER — Other Ambulatory Visit: Payer: Self-pay | Admitting: Oncology

## 2012-08-22 ENCOUNTER — Telehealth: Payer: Self-pay | Admitting: Oncology

## 2012-08-22 NOTE — Telephone Encounter (Signed)
Pt will be here tomorrow

## 2012-08-23 ENCOUNTER — Ambulatory Visit (HOSPITAL_BASED_OUTPATIENT_CLINIC_OR_DEPARTMENT_OTHER): Payer: Medicare Other

## 2012-08-23 ENCOUNTER — Other Ambulatory Visit (HOSPITAL_BASED_OUTPATIENT_CLINIC_OR_DEPARTMENT_OTHER): Payer: Medicare Other | Admitting: Lab

## 2012-08-23 VITALS — BP 100/66 | HR 73 | Temp 98.2°F | Resp 20

## 2012-08-23 DIAGNOSIS — C9110 Chronic lymphocytic leukemia of B-cell type not having achieved remission: Secondary | ICD-10-CM

## 2012-08-23 DIAGNOSIS — Z5112 Encounter for antineoplastic immunotherapy: Secondary | ICD-10-CM

## 2012-08-23 DIAGNOSIS — Z5111 Encounter for antineoplastic chemotherapy: Secondary | ICD-10-CM

## 2012-08-23 LAB — COMPREHENSIVE METABOLIC PANEL (CC13)
ALT: 13 U/L (ref 0–55)
Albumin: 3.4 g/dL — ABNORMAL LOW (ref 3.5–5.0)
CO2: 26 mEq/L (ref 22–29)
Calcium: 10.1 mg/dL (ref 8.4–10.4)
Chloride: 102 mEq/L (ref 98–107)
Glucose: 120 mg/dl — ABNORMAL HIGH (ref 70–99)
Potassium: 4.2 mEq/L (ref 3.5–5.1)
Sodium: 140 mEq/L (ref 136–145)
Total Bilirubin: 0.46 mg/dL (ref 0.20–1.20)
Total Protein: 6.8 g/dL (ref 6.4–8.3)

## 2012-08-23 LAB — CBC WITH DIFFERENTIAL/PLATELET
Basophils Absolute: 0 10*3/uL (ref 0.0–0.1)
Eosinophils Absolute: 0.1 10*3/uL (ref 0.0–0.5)
HGB: 12.2 g/dL (ref 11.6–15.9)
MCV: 85.3 fL (ref 79.5–101.0)
MONO#: 0.5 10*3/uL (ref 0.1–0.9)
MONO%: 13.7 % (ref 0.0–14.0)
NEUT#: 2.2 10*3/uL (ref 1.5–6.5)
RBC: 4.3 10*6/uL (ref 3.70–5.45)
RDW: 16.6 % — ABNORMAL HIGH (ref 11.2–14.5)
WBC: 3.8 10*3/uL — ABNORMAL LOW (ref 3.9–10.3)
nRBC: 0 % (ref 0–0)

## 2012-08-23 LAB — LACTATE DEHYDROGENASE (CC13): LDH: 275 U/L — ABNORMAL HIGH (ref 125–245)

## 2012-08-23 MED ORDER — DEXAMETHASONE SODIUM PHOSPHATE 10 MG/ML IJ SOLN
10.0000 mg | Freq: Once | INTRAMUSCULAR | Status: AC
  Administered 2012-08-23: 10 mg via INTRAVENOUS

## 2012-08-23 MED ORDER — ACETAMINOPHEN 325 MG PO TABS
650.0000 mg | ORAL_TABLET | Freq: Once | ORAL | Status: AC
  Administered 2012-08-23: 650 mg via ORAL

## 2012-08-23 MED ORDER — ONDANSETRON 8 MG/50ML IVPB (CHCC)
8.0000 mg | Freq: Once | INTRAVENOUS | Status: AC
  Administered 2012-08-23 (×2): 8 mg via INTRAVENOUS

## 2012-08-23 MED ORDER — SODIUM CHLORIDE 0.9 % IV SOLN
70.0000 mg/m2 | Freq: Once | INTRAVENOUS | Status: AC
  Administered 2012-08-23: 120 mg via INTRAVENOUS
  Filled 2012-08-23: qty 24

## 2012-08-23 MED ORDER — SODIUM CHLORIDE 0.9 % IV SOLN
375.0000 mg/m2 | Freq: Once | INTRAVENOUS | Status: AC
  Administered 2012-08-23: 600 mg via INTRAVENOUS
  Filled 2012-08-23: qty 60

## 2012-08-23 MED ORDER — SODIUM CHLORIDE 0.9 % IV SOLN
Freq: Once | INTRAVENOUS | Status: AC
  Administered 2012-08-23: 11:00:00 via INTRAVENOUS

## 2012-08-23 MED ORDER — DIPHENHYDRAMINE HCL 25 MG PO CAPS
50.0000 mg | ORAL_CAPSULE | Freq: Once | ORAL | Status: AC
  Administered 2012-08-23: 50 mg via ORAL

## 2012-08-23 NOTE — Patient Instructions (Addendum)
[facility] Discharge Instructions for Patients Receiving Chemotherapy  Today you received the following chemotherapy agents Rituxan/Treanda.  To help prevent nausea and vomiting after your treatment, we encourage you to take your nausea medication as directed.   If you develop nausea and vomiting that is not controlled by your nausea medication, call the clinic. If it is after clinic hours your family physician or the after hours number for the clinic or go to the Emergency Department.   BELOW ARE SYMPTOMS THAT SHOULD BE REPORTED IMMEDIATELY:  *FEVER GREATER THAN 100.5 F  *CHILLS WITH OR WITHOUT FEVER  NAUSEA AND VOMITING THAT IS NOT CONTROLLED WITH YOUR NAUSEA MEDICATION  *UNUSUAL SHORTNESS OF BREATH  *UNUSUAL BRUISING OR BLEEDING  TENDERNESS IN MOUTH AND THROAT WITH OR WITHOUT PRESENCE OF ULCERS  *URINARY PROBLEMS  *BOWEL PROBLEMS  UNUSUAL RASH Items with * indicate a potential emergency and should be followed up as soon as possible.  Feel free to call the clinic you have any questions or concerns. The clinic phone number is [phone].

## 2012-08-24 ENCOUNTER — Telehealth: Payer: Self-pay | Admitting: Oncology

## 2012-08-24 ENCOUNTER — Ambulatory Visit (HOSPITAL_BASED_OUTPATIENT_CLINIC_OR_DEPARTMENT_OTHER): Payer: Medicare Other

## 2012-08-24 VITALS — BP 109/66 | HR 75 | Temp 98.1°F | Resp 18

## 2012-08-24 DIAGNOSIS — Z5111 Encounter for antineoplastic chemotherapy: Secondary | ICD-10-CM

## 2012-08-24 DIAGNOSIS — C9110 Chronic lymphocytic leukemia of B-cell type not having achieved remission: Secondary | ICD-10-CM

## 2012-08-24 MED ORDER — BENDAMUSTINE HCL (LYOPHILIZED PWD) CHEMO INJECTION 100MG
70.0000 mg/m2 | Freq: Once | INTRAVENOUS | Status: AC
  Administered 2012-08-24: 120 mg via INTRAVENOUS
  Filled 2012-08-24: qty 24

## 2012-08-24 MED ORDER — ONDANSETRON 8 MG/50ML IVPB (CHCC)
8.0000 mg | Freq: Once | INTRAVENOUS | Status: AC
  Administered 2012-08-24: 8 mg via INTRAVENOUS

## 2012-08-24 MED ORDER — DEXAMETHASONE SODIUM PHOSPHATE 10 MG/ML IJ SOLN
10.0000 mg | Freq: Once | INTRAMUSCULAR | Status: AC
  Administered 2012-08-24: 10 mg via INTRAVENOUS

## 2012-08-24 MED ORDER — SODIUM CHLORIDE 0.9 % IV SOLN
Freq: Once | INTRAVENOUS | Status: AC
  Administered 2012-08-24: 09:00:00 via INTRAVENOUS

## 2012-08-24 NOTE — Patient Instructions (Signed)
[facility] Discharge Instructions for Patients Receiving Chemotherapy  Today you received the following chemotherapy agents: Treanda. To help prevent nausea and vomiting after your treatment, we encourage you to take your nausea medication, Zofran. Begin taking it the morning of 08/25/12 and take it as often as prescribed for the next 48 hours.   If you develop nausea and vomiting that is not controlled by your nausea medication, call the clinic. If it is after clinic hours your family physician or the after hours number for the clinic or go to the Emergency Department.   BELOW ARE SYMPTOMS THAT SHOULD BE REPORTED IMMEDIATELY:  *FEVER GREATER THAN 100.5 F  *CHILLS WITH OR WITHOUT FEVER  NAUSEA AND VOMITING THAT IS NOT CONTROLLED WITH YOUR NAUSEA MEDICATION  *UNUSUAL SHORTNESS OF BREATH  *UNUSUAL BRUISING OR BLEEDING  TENDERNESS IN MOUTH AND THROAT WITH OR WITHOUT PRESENCE OF ULCERS  *URINARY PROBLEMS  *BOWEL PROBLEMS  UNUSUAL RASH Items with * indicate a potential emergency and should be followed up as soon as possible.  Feel free to call the clinic you have any questions or concerns. The clinic phone number is [phone].   I have been informed and understand all the instructions given to me. I know to contact the clinic, my physician, or go to the Emergency Department if any problems should occur. I do not have any questions at this time, but understand that I may call the clinic during office hours   should I have any questions or need assistance in obtaining follow up care.

## 2012-08-24 NOTE — Telephone Encounter (Signed)
Gave pt appt for 09/10/12 lab and MD , r/s from 2/28 due to MD call

## 2012-09-07 ENCOUNTER — Ambulatory Visit: Payer: Medicare Other | Admitting: Oncology

## 2012-09-07 ENCOUNTER — Other Ambulatory Visit: Payer: Medicare Other | Admitting: Lab

## 2012-09-10 ENCOUNTER — Ambulatory Visit (HOSPITAL_BASED_OUTPATIENT_CLINIC_OR_DEPARTMENT_OTHER): Payer: Medicare Other | Admitting: Oncology

## 2012-09-10 ENCOUNTER — Telehealth: Payer: Self-pay | Admitting: *Deleted

## 2012-09-10 ENCOUNTER — Other Ambulatory Visit (HOSPITAL_BASED_OUTPATIENT_CLINIC_OR_DEPARTMENT_OTHER): Payer: Medicare Other | Admitting: Lab

## 2012-09-10 ENCOUNTER — Telehealth: Payer: Self-pay | Admitting: Oncology

## 2012-09-10 VITALS — BP 106/47 | HR 70 | Temp 98.6°F | Resp 20 | Ht 64.0 in | Wt 148.2 lb

## 2012-09-10 DIAGNOSIS — Z87898 Personal history of other specified conditions: Secondary | ICD-10-CM

## 2012-09-10 DIAGNOSIS — D0591 Unspecified type of carcinoma in situ of right breast: Secondary | ICD-10-CM

## 2012-09-10 DIAGNOSIS — C9110 Chronic lymphocytic leukemia of B-cell type not having achieved remission: Secondary | ICD-10-CM

## 2012-09-10 LAB — CBC WITH DIFFERENTIAL/PLATELET
BASO%: 0.3 % (ref 0.0–2.0)
Basophils Absolute: 0 10*3/uL (ref 0.0–0.1)
HCT: 35.1 % (ref 34.8–46.6)
HGB: 11.7 g/dL (ref 11.6–15.9)
LYMPH%: 20.6 % (ref 14.0–49.7)
MCH: 28.5 pg (ref 25.1–34.0)
MCHC: 33.3 g/dL (ref 31.5–36.0)
MONO#: 0.5 10*3/uL (ref 0.1–0.9)
NEUT%: 60.2 % (ref 38.4–76.8)
Platelets: 177 10*3/uL (ref <2.0)
WBC: 3 10*3/uL — ABNORMAL LOW (ref 3.9–10.3)
lymph#: 0.6 10*3/uL — ABNORMAL LOW (ref 0.9–3.3)

## 2012-09-10 NOTE — Telephone Encounter (Signed)
Per staff message and POF I have scheduled appts.  JMW  

## 2012-09-10 NOTE — Telephone Encounter (Signed)
gv and printed appt scheduele for pt for March and Apri

## 2012-09-10 NOTE — Progress Notes (Signed)
Hematology and Oncology Follow Up Visit  Margaret Mann [id number] 1932-12-13 79 y.o. 09/10/2012 4:42 PM   Principle Diagnosis: Encounter Diagnoses  Name Primary?  . Carcinoma in situ of breast, right   . CLL (chronic lymphocytic leukemia) Yes     Interim History:   Short interim followup visit for this pleasant 79 year old woman under active treatment for RAI stage I chronic lymphocytic leukemia. She was started on a combination of Bendamustine  plus Rituxan in October 2013. She has had 5 of 6 planned cycles to date. Please see my detailed summary note of 08/01/2012 for more details. I made a dose reduction from 90 down to 70 mg per meter squared day 1 day to do this development of significant neutropenia. Fortunately she did not contract any active infections while neutropenic. We are seeing a nice response. Total white count is now low and differential has shifted back towards normal with today's count showing hemoglobin 11.7, hematocrit 35, white count 3000, 60% neutrophils, 21% lymphocytes compared with pretreatment counts with hemoglobin 10.7, white count 97,500 with 92% lymphocytes and 6% neutrophils on 05/03/2012.  She's had no major medical problems. No infections.  Medications: reviewed  Allergies:  Allergies  Allergen Reactions  . Dilaudid (Hydromorphone Hcl) Nausea And Vomiting    Review of Systems: Constitutional:   No constitutional symptoms Respiratory: No cough or dyspnea Cardiovascular:  No chest pain or palpitations Gastrointestinal: No abdominal pain or change in bowel habit Genito-Urinary: No vaginal bleeding. She sees her GYN oncologist next week Musculoskeletal: No muscle or bone pain Neurologic: No headache or change in vision Skin: No rash Remaining ROS negative.  Physical Exam: Blood pressure 106/47, pulse 70, temperature 98.6 F (37 C), temperature source Oral, resp. rate 20, height 5\' 4"  (1.626 m), weight 148 lb 3.2 oz (67.223 kg). Wt Readings from  Last 3 Encounters:  09/10/12 148 lb 3.2 oz (67.223 kg)  08/01/12 144 lb (65.318 kg)  07/06/12 142 lb 6.4 oz (64.592 kg)     General appearance:  HENNT: Pharynx no erythema or exudate Lymph nodes: No cervical, supraclavicular, axillary, or inguinal adenopathy Breasts: Not examined today see last months note Lungs: Clear to auscultation resonant to percussion Heart: Regular rhythm, question 1/6 aortic systolic murmur Abdomen: Soft, nontender, no mass, no organomegaly Extremities: No edema, no calf tenderness Vascular: No cyanosis Neurologic: She is alert and oriented, motor strength 5 over 5, reflexes 1+ symmetric Skin: Scattered small ecchymoses on the skin of her hands and wrists  Lab Results: Lab Results  Component Value Date   WBC 3.0* 09/10/2012   HGB 11.7 09/10/2012   HCT 35.1 09/10/2012   MCV 85.4 09/10/2012   PLT 177 09/10/2012     Chemistry      Component Value Date/Time   NA 140 08/23/2012 0958   NA 141 03/02/2012 0938   K 4.2 08/23/2012 0958   K 4.3 03/02/2012 0938   CL 102 08/23/2012 0958   CL 104 03/02/2012 0938   CO2 26 08/23/2012 0958   CO2 25 03/02/2012 0938   BUN 16.4 08/23/2012 0958   BUN 24* 03/02/2012 0938   CREATININE 0.8 08/23/2012 0958   CREATININE 0.89 03/02/2012 0938      Component Value Date/Time   CALCIUM 10.1 08/23/2012 0958   CALCIUM 9.6 03/02/2012 0938   ALKPHOS 73 08/23/2012 0958   ALKPHOS 55 03/02/2012 0938   AST 27 08/23/2012 0958   AST 26 03/02/2012 0938   ALT 13 08/23/2012 0958   ALT 12 03/02/2012  9563   BILITOT 0.46 08/23/2012 0958   BILITOT 0.5 03/02/2012 8756       Radiological Studies: No results found.  Impression and Plan: #1. RAI stage I, chronic lymphocytic leukemia with deletion 17 cytogenetics  Ongoing response to treatment.  Plan:  proceed with sixth and final treatment on March 13 and 14. It is somewhat unpredictable how long she will maintain her response. Average is 2-3 years. I reviewed this with her today. We will likely need to treat  again in the future. Fortunately we have a number of new therapies available. Since her visit with me just one month ago, we have another new agent approved for relapsed disease-Ibrutinib  which is a oral B cell receptor signaling inhibitor with high activity in relapsed CLL. .  #2. 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  No evidence for recurrence. She will continue followup with Dr. Clifton Dora Simeone.   #3. DCIS right breast treated as outlined above.   No signs of recurrence. Next mammogram due in August 2013   #4. History of idiopathic CHF currently asymptomatic on low-dose diuretic.   #5. Essential hypertension     CC:.    Levert Feinstein, MD 3/3/20144:42 PM

## 2012-09-10 NOTE — Patient Instructions (Signed)
Last round of chemo 3/13 & 3/14 Return visit with Dr Reece Agar on 4/28 with lab

## 2012-09-19 ENCOUNTER — Other Ambulatory Visit: Payer: Self-pay | Admitting: *Deleted

## 2012-09-19 DIAGNOSIS — C9110 Chronic lymphocytic leukemia of B-cell type not having achieved remission: Secondary | ICD-10-CM

## 2012-09-20 ENCOUNTER — Ambulatory Visit (HOSPITAL_BASED_OUTPATIENT_CLINIC_OR_DEPARTMENT_OTHER): Payer: Medicare Other

## 2012-09-20 ENCOUNTER — Other Ambulatory Visit (HOSPITAL_BASED_OUTPATIENT_CLINIC_OR_DEPARTMENT_OTHER): Payer: Medicare Other | Admitting: Lab

## 2012-09-20 VITALS — BP 110/45 | HR 73 | Temp 97.8°F | Resp 18

## 2012-09-20 DIAGNOSIS — C9110 Chronic lymphocytic leukemia of B-cell type not having achieved remission: Secondary | ICD-10-CM

## 2012-09-20 DIAGNOSIS — Z5111 Encounter for antineoplastic chemotherapy: Secondary | ICD-10-CM

## 2012-09-20 DIAGNOSIS — Z5112 Encounter for antineoplastic immunotherapy: Secondary | ICD-10-CM

## 2012-09-20 LAB — CBC WITH DIFFERENTIAL/PLATELET
Basophils Absolute: 0 10*3/uL (ref 0.0–0.1)
EOS%: 2.6 % (ref 0.0–7.0)
Eosinophils Absolute: 0.1 10*3/uL (ref 0.0–0.5)
HCT: 35.3 % (ref 34.8–46.6)
HGB: 11.7 g/dL (ref 11.6–15.9)
LYMPH%: 23.6 % (ref 14.0–49.7)
MCH: 28.4 pg (ref 25.1–34.0)
MCV: 85.7 fL (ref 79.5–101.0)
MONO%: 16.6 % — ABNORMAL HIGH (ref 0.0–14.0)
NEUT#: 1.9 10*3/uL (ref 1.5–6.5)
NEUT%: 56.6 % (ref 38.4–76.8)
Platelets: 211 10*3/uL (ref 145–400)
RDW: 16.6 % — ABNORMAL HIGH (ref 11.2–14.5)

## 2012-09-20 MED ORDER — ACETAMINOPHEN 325 MG PO TABS
650.0000 mg | ORAL_TABLET | Freq: Once | ORAL | Status: AC
  Administered 2012-09-20: 650 mg via ORAL

## 2012-09-20 MED ORDER — DIPHENHYDRAMINE HCL 25 MG PO CAPS
50.0000 mg | ORAL_CAPSULE | Freq: Once | ORAL | Status: AC
  Administered 2012-09-20: 50 mg via ORAL

## 2012-09-20 MED ORDER — ONDANSETRON 8 MG/50ML IVPB (CHCC)
8.0000 mg | Freq: Once | INTRAVENOUS | Status: AC
  Administered 2012-09-20: 8 mg via INTRAVENOUS

## 2012-09-20 MED ORDER — SODIUM CHLORIDE 0.9 % IV SOLN
375.0000 mg/m2 | Freq: Once | INTRAVENOUS | Status: AC
  Administered 2012-09-20: 600 mg via INTRAVENOUS
  Filled 2012-09-20: qty 60

## 2012-09-20 MED ORDER — SODIUM CHLORIDE 0.9 % IV SOLN
Freq: Once | INTRAVENOUS | Status: AC
  Administered 2012-09-20: 11:00:00 via INTRAVENOUS

## 2012-09-20 MED ORDER — SODIUM CHLORIDE 0.9 % IV SOLN
70.0000 mg/m2 | Freq: Once | INTRAVENOUS | Status: AC
  Administered 2012-09-20: 120 mg via INTRAVENOUS
  Filled 2012-09-20: qty 24

## 2012-09-20 MED ORDER — DEXAMETHASONE SODIUM PHOSPHATE 10 MG/ML IJ SOLN
10.0000 mg | Freq: Once | INTRAMUSCULAR | Status: AC
  Administered 2012-09-20: 10 mg via INTRAVENOUS

## 2012-09-20 NOTE — Progress Notes (Signed)
Dr. Truett Perna notified for pt's low BP (97/46) at 11:30 am and order given ok to proceed w/ Rituxan as ordered.

## 2012-09-20 NOTE — Patient Instructions (Addendum)
[facility] Discharge Instructions for Patients Receiving Chemotherapy  Today you received the following chemotherapy agents ; Treanda and Rituxan.  To help prevent nausea and vomiting after your treatment, we encourage you to take your nausea medication; Zofran (ondansetron) or Compazine (prochloraperzine) as directed.   If you develop nausea and vomiting that is not controlled by your nausea medication, call the clinic. If it is after clinic hours your family physician or the after hours number for the clinic or go to the Emergency Department.   BELOW ARE SYMPTOMS THAT SHOULD BE REPORTED IMMEDIATELY:  *FEVER GREATER THAN 100.5 F  *CHILLS WITH OR WITHOUT FEVER  NAUSEA AND VOMITING THAT IS NOT CONTROLLED WITH YOUR NAUSEA MEDICATION  *UNUSUAL SHORTNESS OF BREATH  *UNUSUAL BRUISING OR BLEEDING  TENDERNESS IN MOUTH AND THROAT WITH OR WITHOUT PRESENCE OF ULCERS  *URINARY PROBLEMS  *BOWEL PROBLEMS  UNUSUAL RASH Items with * indicate a potential emergency and should be followed up as soon as possible.   Feel free to call the clinic you have any questions or concerns. The clinic phone number is [phone].   I have been informed and understand all the instructions given to me. I know to contact the clinic, my physician, or go to the Emergency Department if any problems should occur. I do not have any questions at this time, but understand that I may call the clinic during office hours   should I have any questions or need assistance in obtaining follow up care.    __________________________________________  _____________  __________ Signature of Patient or Authorized Representative            Date                   Time    __________________________________________ Nurse's Signature

## 2012-09-21 ENCOUNTER — Ambulatory Visit (HOSPITAL_BASED_OUTPATIENT_CLINIC_OR_DEPARTMENT_OTHER): Payer: Medicare Other

## 2012-09-21 VITALS — BP 104/63 | HR 68 | Temp 97.7°F

## 2012-09-21 DIAGNOSIS — Z5111 Encounter for antineoplastic chemotherapy: Secondary | ICD-10-CM

## 2012-09-21 DIAGNOSIS — C9110 Chronic lymphocytic leukemia of B-cell type not having achieved remission: Secondary | ICD-10-CM

## 2012-09-21 MED ORDER — SODIUM CHLORIDE 0.9 % IV SOLN
70.0000 mg/m2 | Freq: Once | INTRAVENOUS | Status: AC
  Administered 2012-09-21: 120 mg via INTRAVENOUS
  Filled 2012-09-21: qty 24

## 2012-09-21 MED ORDER — SODIUM CHLORIDE 0.9 % IV SOLN
Freq: Once | INTRAVENOUS | Status: AC
  Administered 2012-09-21: 10:00:00 via INTRAVENOUS

## 2012-09-21 MED ORDER — ONDANSETRON 8 MG/50ML IVPB (CHCC): 8.0000 mg | Freq: Once | INTRAVENOUS | Status: DC

## 2012-09-21 MED ORDER — DEXAMETHASONE SODIUM PHOSPHATE 10 MG/ML IJ SOLN: 10.0000 mg | Freq: Once | INTRAMUSCULAR | Status: DC

## 2012-09-21 NOTE — Patient Instructions (Addendum)
   Congratulations on your last Chemo treatment!!!!!    [facility] Discharge Instructions for Patients Receiving Chemotherapy  Today you received the following chemotherapy agentsTreanda  To help prevent nausea and vomiting after your treatment, we encourage you to take your nausea medication as ordered per MD. .   If you develop nausea and vomiting that is not controlled by your nausea medication, call the clinic. If it is after clinic hours your family physician or the after hours number for the clinic or go to the Emergency Department.   BELOW ARE SYMPTOMS THAT SHOULD BE REPORTED IMMEDIATELY:  *FEVER GREATER THAN 100.5 F  *CHILLS WITH OR WITHOUT FEVER  NAUSEA AND VOMITING THAT IS NOT CONTROLLED WITH YOUR NAUSEA MEDICATION  *UNUSUAL SHORTNESS OF BREATH  *UNUSUAL BRUISING OR BLEEDING  TENDERNESS IN MOUTH AND THROAT WITH OR WITHOUT PRESENCE OF ULCERS  *URINARY PROBLEMS  *BOWEL PROBLEMS  UNUSUAL RASH Items with * indicate a potential emergency and should be followed up as soon as possible.  . Please let the nurse know about any problems that you may have experienced. Feel free to call the clinic you have any questions or concerns. The clinic phone number is [phone].

## 2012-09-21 NOTE — Progress Notes (Signed)
Ondansetron 8mg  and Dexamethasone 10mg  hung at 1005 by Erie Noe, RN.  Infused together over .  End time- 1020.  This RN received report that meds hung, however, unable to scan at time of hanging due to orders locked by pharmacist.  Pt left before meds were able to be scanned.

## 2012-09-24 ENCOUNTER — Other Ambulatory Visit: Payer: Self-pay | Admitting: Certified Registered Nurse Anesthetist

## 2012-10-12 ENCOUNTER — Telehealth: Payer: Self-pay | Admitting: *Deleted

## 2012-10-12 NOTE — Telephone Encounter (Signed)
Patient called to leave Dr. Cyndie Chime a message.  Her CA 125 from yesterday was 13.6.  Will let Dr. Cyndie Chime know.

## 2012-11-05 ENCOUNTER — Other Ambulatory Visit (HOSPITAL_BASED_OUTPATIENT_CLINIC_OR_DEPARTMENT_OTHER): Payer: Medicare Other | Admitting: Lab

## 2012-11-05 ENCOUNTER — Ambulatory Visit: Payer: Medicare Other | Admitting: Oncology

## 2012-11-05 ENCOUNTER — Telehealth: Payer: Self-pay | Admitting: Oncology

## 2012-11-05 ENCOUNTER — Ambulatory Visit (HOSPITAL_BASED_OUTPATIENT_CLINIC_OR_DEPARTMENT_OTHER): Payer: Medicare Other | Admitting: Oncology

## 2012-11-05 VITALS — BP 104/45 | HR 76 | Temp 97.9°F | Resp 18 | Ht 64.0 in | Wt 147.9 lb

## 2012-11-05 DIAGNOSIS — C55 Malignant neoplasm of uterus, part unspecified: Secondary | ICD-10-CM

## 2012-11-05 DIAGNOSIS — C9110 Chronic lymphocytic leukemia of B-cell type not having achieved remission: Secondary | ICD-10-CM

## 2012-11-05 DIAGNOSIS — Z87898 Personal history of other specified conditions: Secondary | ICD-10-CM

## 2012-11-05 DIAGNOSIS — I10 Essential (primary) hypertension: Secondary | ICD-10-CM

## 2012-11-05 DIAGNOSIS — D0591 Unspecified type of carcinoma in situ of right breast: Secondary | ICD-10-CM

## 2012-11-05 LAB — COMPREHENSIVE METABOLIC PANEL (CC13)
Albumin: 3.5 g/dL (ref 3.5–5.0)
Alkaline Phosphatase: 68 U/L (ref 40–150)
BUN: 20.5 mg/dL (ref 7.0–26.0)
CO2: 29 mEq/L (ref 22–29)
Glucose: 93 mg/dl (ref 70–99)
Potassium: 4.1 mEq/L (ref 3.5–5.1)

## 2012-11-05 LAB — CBC WITH DIFFERENTIAL/PLATELET
Basophils Absolute: 0 10*3/uL (ref 0.0–0.1)
Eosinophils Absolute: 0.2 10*3/uL (ref 0.0–0.5)
HCT: 35.9 % (ref 34.8–46.6)
LYMPH%: 21.9 % (ref 14.0–49.7)
MONO#: 0.6 10*3/uL (ref 0.1–0.9)
NEUT#: 2.7 10*3/uL (ref 1.5–6.5)
NEUT%: 59.8 % (ref 38.4–76.8)
Platelets: 219 10*3/uL (ref 145–400)
WBC: 4.6 10*3/uL (ref 3.9–10.3)

## 2012-11-05 LAB — LACTATE DEHYDROGENASE (CC13): LDH: 236 U/L (ref 125–245)

## 2012-11-05 NOTE — Progress Notes (Signed)
Hematology and Oncology Follow Up Visit  Margaret Mann [id number] 08-19-1932 79 y.o. 11/05/2012 11:28 AM   Principle Diagnosis: Encounter Diagnoses  Name Primary?  . CLL (chronic lymphocytic leukemia) Yes  . Uterine cancer      Interim History:   Followup visit for this pleasant 79 year old woman with 3 primary cancers-breast, endometrial, and chronic lymphocytic leukemia. Please see my summary note dated 08/01/2012 for full details. She has poor prognostic cytogenetics for her CLL with a deletion of chromosome 17. Due to a rising white count and falling hemoglobin she was started on treatment with a combination of Treanda   plus Rituxan beginning in October 2013. She recently completed 6 cycles in March of this year. She achieved a near complete response. Blood counts today are excellent with hemoglobin 12.4, white count 4600 with 60% neutrophils, 22% lymphocytes, and platelet count 219,000.  She had a recent followup with her gynecologic oncologist and no obvious new disease was found. Tumor markers remain in normal range.  Next mammogram is due in August. Most recent study done in August 2013 showed no new disease at that time.  She is feeling much better off the chemotherapy and back to doing more of the things that she likes to do. 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 The only other thing she has noticed about her self is a her skin seems to be breaking out with small pustules on her face.  Medications: reviewed  Allergies:  Allergies  Allergen Reactions  . Dilaudid (Hydromorphone Hcl) Nausea And Vomiting    Review of Systems: Constitutional:   Improved constitutional symptoms Respiratory: No cough or dyspnea Cardiovascular:  No chest pain or palpitations Gastrointestinal: No abdominal pain or change in bowel habit Genito-Urinary: No vaginal bleeding Musculoskeletal: No muscle or bone pain Neurologic: No headache or change in vision Skin: See above Remaining ROS  negative.  Physical Exam: Blood pressure 104/45, pulse 76, temperature 97.9 F (36.6 C), temperature source Oral, resp. rate 18, height 5\' 4"  (1.626 m), weight 147 lb 14.4 oz (67.087 kg). Wt Readings from Last 3 Encounters:  11/05/12 147 lb 14.4 oz (67.087 kg)  09/10/12 148 lb 3.2 oz (67.223 kg)  08/01/12 144 lb (65.318 kg)     General appearance: Well-nourished Caucasian woman HENNT: Pharynx no erythema or exudate Lymph nodes: No cervical, supraclavicular, or axillary adenopathy Breasts: Not examined today Lungs: Clear to auscultation resonant to percussion Heart: Regular rhythm no murmur Abdomen: Soft, nontender, no mass, no organomegaly Extremities: No edema, no calf tenderness Vascular: No cyanosis Neurologic: No focal deficit Skin: A few scattered 3-4 mm lesions on her forehead nose and left cheek which are really nondescript  Lab Results: Lab Results  Component Value Date   WBC 4.6 11/05/2012   HGB 12.4 11/05/2012   HCT 35.9 11/05/2012   MCV 86.4 11/05/2012   PLT 219 11/05/2012     Chemistry      Component Value Date/Time   NA 138 11/05/2012 0923   NA 141 03/02/2012 0938   K 4.1 11/05/2012 0923   K 4.3 03/02/2012 0938   CL 101 11/05/2012 0923   CL 104 03/02/2012 0938   CO2 29 11/05/2012 0923   CO2 25 03/02/2012 0938   BUN 20.5 11/05/2012 0923   BUN 24* 03/02/2012 0938   CREATININE 0.8 11/05/2012 0923   CREATININE 0.89 03/02/2012 0938      Component Value Date/Time   CALCIUM 9.8 11/05/2012 0923   CALCIUM 9.6 03/02/2012 0938   ALKPHOS 68 11/05/2012  1610   ALKPHOS 55 03/02/2012 0938   AST 27 11/05/2012 0923   AST 26 03/02/2012 0938   ALT 12 11/05/2012 0923   ALT 12 03/02/2012 0938   BILITOT 0.70 11/05/2012 0923   BILITOT 0.5 03/02/2012 9604       Radiological Studies: No results found.  Impression and Plan: #1. RAI stage I, chronic lymphocytic leukemia with deletion 17 cytogenetics  She  responded well to a combination of Treanda and Rituxan  Plan: I will check lab every  2 months with a clinical visit in 6 months. She will be due for her flu vaccine at that time. I told her she could stop her acyclovir at this point. Of note she had a previous zoster vaccine. She took acyclovir prophylactically during her chemotherapy.  #2. 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 She will continue followup with Dr. Clifton James.   #3. DCIS right breast.  No signs of recurrence. Next mammogram due in August 2013   #4. History of idiopathic CHF currently asymptomatic on low-dose diuretic.  She wants to cut back on her potassium. I told her I would leave this to the discretion of her primary care physician who she sees next month.  #5. Essential hypertension    CC:. Dr. Maurice Small; Dr. Madaline Guthrie; Dr. Maryfrances Bunnell; [facility]; Dr. Rollene Rotunda    Levert Feinstein, MD 4/28/201411:28 AM

## 2012-12-31 ENCOUNTER — Telehealth: Payer: Self-pay | Admitting: *Deleted

## 2012-12-31 ENCOUNTER — Other Ambulatory Visit (HOSPITAL_BASED_OUTPATIENT_CLINIC_OR_DEPARTMENT_OTHER): Payer: Medicare Other

## 2012-12-31 DIAGNOSIS — C55 Malignant neoplasm of uterus, part unspecified: Secondary | ICD-10-CM

## 2012-12-31 DIAGNOSIS — C9110 Chronic lymphocytic leukemia of B-cell type not having achieved remission: Secondary | ICD-10-CM

## 2012-12-31 LAB — CBC WITH DIFFERENTIAL/PLATELET
Basophils Absolute: 0 10*3/uL (ref 0.0–0.1)
Eosinophils Absolute: 0.9 10*3/uL — ABNORMAL HIGH (ref 0.0–0.5)
HGB: 13.1 g/dL (ref 11.6–15.9)
MCV: 86.1 fL (ref 79.5–101.0)
MONO#: 0.5 10*3/uL (ref 0.1–0.9)
NEUT#: 2.9 10*3/uL (ref 1.5–6.5)
RDW: 14.3 % (ref 11.2–14.5)
WBC: 5.2 10*3/uL (ref 3.9–10.3)
lymph#: 1 10*3/uL (ref 0.9–3.3)

## 2012-12-31 NOTE — Telephone Encounter (Signed)
Pt. Called concerned about abnormal lab today - increase in eosinophils.  What are those and should I worry?  Let her know that those are sometimes elevated with viral illness or allergy.  Not something to be concerned about at this point.  She is to get labs every 2 months.  They forgot to give her an appointment for August.  She will call schedulers and get one for that month.

## 2012-12-31 NOTE — Telephone Encounter (Signed)
Talked to pt gave her appt for August  ansd October 2014

## 2013-01-23 ENCOUNTER — Other Ambulatory Visit: Payer: Self-pay | Admitting: Oncology

## 2013-01-23 DIAGNOSIS — Z9889 Other specified postprocedural states: Secondary | ICD-10-CM

## 2013-01-23 DIAGNOSIS — Z853 Personal history of malignant neoplasm of breast: Secondary | ICD-10-CM

## 2013-02-28 ENCOUNTER — Ambulatory Visit
Admission: RE | Admit: 2013-02-28 | Discharge: 2013-02-28 | Disposition: A | Discharge status: Home / Self Care | Payer: Medicare Other | Source: Ambulatory Visit | Attending: Oncology | Admitting: Oncology

## 2013-02-28 ENCOUNTER — Other Ambulatory Visit: Payer: Self-pay | Admitting: Oncology

## 2013-02-28 DIAGNOSIS — Z9889 Other specified postprocedural states: Secondary | ICD-10-CM

## 2013-02-28 DIAGNOSIS — R920 Mammographic microcalcification found on diagnostic imaging of breast: Secondary | ICD-10-CM

## 2013-02-28 DIAGNOSIS — Z853 Personal history of malignant neoplasm of breast: Secondary | ICD-10-CM

## 2013-03-04 ENCOUNTER — Other Ambulatory Visit (HOSPITAL_BASED_OUTPATIENT_CLINIC_OR_DEPARTMENT_OTHER): Payer: Medicare Other

## 2013-03-04 DIAGNOSIS — C55 Malignant neoplasm of uterus, part unspecified: Secondary | ICD-10-CM

## 2013-03-04 DIAGNOSIS — C9110 Chronic lymphocytic leukemia of B-cell type not having achieved remission: Secondary | ICD-10-CM

## 2013-03-04 LAB — CBC WITH DIFFERENTIAL/PLATELET
BASO%: 0.3 % (ref 0.0–2.0)
EOS%: 3.4 % (ref 0.0–7.0)
HCT: 38.6 % (ref 34.8–46.6)
LYMPH%: 32.3 % (ref 14.0–49.7)
MCH: 29.7 pg (ref 25.1–34.0)
MCHC: 34.2 g/dL (ref 31.5–36.0)
MONO%: 10.5 % (ref 0.0–14.0)
NEUT%: 53.5 % (ref 38.4–76.8)
Platelets: 177 10*3/uL (ref 145–400)
lymph#: 2 10*3/uL (ref 0.9–3.3)

## 2013-03-14 ENCOUNTER — Ambulatory Visit
Admission: RE | Admit: 2013-03-14 | Discharge: 2013-03-14 | Disposition: A | Discharge status: Home / Self Care | Payer: Medicare Other | Source: Ambulatory Visit | Attending: Oncology | Admitting: Oncology

## 2013-03-14 ENCOUNTER — Other Ambulatory Visit (HOSPITAL_COMMUNITY): Payer: Self-pay | Admitting: Diagnostic Radiology

## 2013-03-14 DIAGNOSIS — R920 Mammographic microcalcification found on diagnostic imaging of breast: Secondary | ICD-10-CM

## 2013-03-21 ENCOUNTER — Ambulatory Visit (INDEPENDENT_AMBULATORY_CARE_PROVIDER_SITE_OTHER): Payer: Medicare Other | Admitting: General Surgery

## 2013-03-21 ENCOUNTER — Encounter (INDEPENDENT_AMBULATORY_CARE_PROVIDER_SITE_OTHER): Payer: Self-pay | Admitting: General Surgery

## 2013-03-21 VITALS — BP 132/74 | HR 70 | Temp 98.0°F | Resp 18 | Ht 64.0 in | Wt 147.0 lb

## 2013-03-21 DIAGNOSIS — R920 Mammographic microcalcification found on diagnostic imaging of breast: Secondary | ICD-10-CM

## 2013-03-21 NOTE — Progress Notes (Signed)
Subjective:   Abnormal mammogram left breast and complex sclerosing lesion on core biopsy  Patient ID: Margaret Mann, female   DOB: Nov 19, 1932, 79 y.o.   MRN: [id number]  HPI Patient is a 79 year old female well known to me with a history of ductal carcinoma in situ of the right breast. She underwent lumpectomy, radiation and tamoxifen with a diagnosis in 2007. She subsequently has had a diagnosis of uterine cancer, status post hysterectomy chemotherapy and radiation diagnosed in 2010 and also chronic lymphocytic leukemia with recent course of chemotherapy and now observation. Recent mammogram showed a new area of suspicious calcifications in the upper outer quadrant of the left breast. The area measured a maximum of 1.7 cm. Stereotactic core biopsy was recommended and performed. This has revealed a complex sclerosing lesion with calcifications. Excisional biopsy has been recommended and she was referred by Dr. Chilton Si for consideration for this. She has not noted any breast lumps or nipple discharge or breast pain or other worrisome symptoms. Despite her extensive history as above she currently is feeling pretty well and is very active.  No past medical history on file. Past Surgical History  Procedure Laterality Date  . Biopsy breast  2008  . Endometrial biopsy  2010   Current Outpatient Prescriptions  Medication Sig Dispense Refill  . aspirin 81 MG tablet Take 81 mg by mouth daily.      Marland Kitchen atorvastatin (LIPITOR) 40 MG tablet       . calcium carbonate (OS-CAL) 600 MG TABS Take 600 mg by mouth 2 (two) times daily with a meal.      . cetirizine (ZYRTEC) 10 MG tablet Take 10 mg by mouth daily.      . Cholecalciferol (VITAMIN D3) 2000 UNITS TABS Take 1 tablet by mouth daily.      . hydrochlorothiazide (HYDRODIURIL) 25 MG tablet Take 25 mg by mouth daily.      Marland Kitchen lisinopril (PRINIVIL,ZESTRIL) 5 MG tablet Take 5 mg by mouth daily.      . Multiple Vitamin (MULTIVITAMIN) capsule Take 1 capsule by mouth  daily.      . potassium chloride SA (K-DUR,KLOR-CON) 20 MEQ tablet Take 1 tablet (20 mEq total) by mouth 2 (two) times daily.  60 tablet  4  . acyclovir (ZOVIRAX) 400 MG tablet TAKE 1 TABLET DAILY.  30 tablet  3  . amLODipine-atorvastatin (CADUET) 10-40 MG per tablet Take 1 tablet by mouth daily.      Marland Kitchen dexamethasone (DECADRON) 4 MG tablet Take 2 tablets (8 mg total) by mouth 2 (two) times daily with a meal. Take daily starting the day after chemotherapy for 2 days. Take with food.  30 tablet  1  . LORazepam (ATIVAN) 0.5 MG tablet Take 1 tablet (0.5 mg total) by mouth every 6 (six) hours as needed (Nausea or vomiting).  30 tablet  0  . ondansetron (ZOFRAN) 8 MG tablet Take 1 tablet (8 mg total) by mouth 2 (two) times daily. Take two times a day starting the day after chemo for 2 days. Then take two times a day as needed for nausea or vomiting.  30 tablet  1  . prednisoLONE acetate (PRED FORTE) 1 % ophthalmic suspension       . VIGAMOX 0.5 % ophthalmic solution        No current facility-administered medications for this visit.   Allergies  Allergen Reactions  . Dilaudid [Hydromorphone Hcl] Nausea And Vomiting   History  Substance Use Topics  . Smoking status:  Former Smoker  . Smokeless tobacco: Not on file     Comment: 1960s   . Alcohol Use: Not on file     Review of Systems  Constitutional: Positive for fatigue.  Respiratory: Negative.   Cardiovascular: Negative.   Gastrointestinal: Negative.        Objective:   Physical Exam BP 132/74  Pulse 70  Temp(Src) 98 F (36.7 C)  Resp 18  Ht 5\' 4"  (1.626 m)  Wt 147 lb (66.679 kg)  BMI 25.22 kg/m2 General: Alert, well-developed Caucasian female, in no distress Skin: Warm and dry without rash or infection. HEENT: No palpable masses or thyromegaly. Sclera nonicteric. Pupils equal round and reactive. Oropharynx clear. Breasts: Slight bruising lateral left breast. I cannot feel any masses in either breast. Lymph nodes: No cervical,  supraclavicular, or inguinal nodes palpable. Lungs: Breath sounds clear and equal without increased work of breathing Cardiovascular: Regular rate and rhythm without murmur. No JVD or edema. Peripheral pulses intact. Abdomen: Nondistended. Soft and nontender. No masses palpable. No organomegaly. No palpable hernias. Extremities: No edema or joint swelling or deformity. No chronic venous stasis changes. Neurologic: Alert and fully oriented. Gait normal.    Assessment:     Recent finding of abnormal calcifications in the left breast a biopsy showing complex sclerosing lesion. Personal history of DCIS of the right breast. We discussed options including continued followup imaging versus excisional biopsy to rule out underlying malignancy. She strongly prefers excisional biopsy to rule out the small chance of underlying malignancy I think this is reasonable. We discussed needle localized excisional biopsy under general anesthesia as an outpatient. Risks of bleeding and infection and anesthetic complications were discussed and understood.    Plan:     Needle localized left breast lumpectomy under general anesthesia as an outpatient

## 2013-04-02 ENCOUNTER — Encounter (HOSPITAL_BASED_OUTPATIENT_CLINIC_OR_DEPARTMENT_OTHER): Payer: Self-pay | Admitting: *Deleted

## 2013-04-02 NOTE — Progress Notes (Signed)
Pt to come in for bmet-ekg-lovely lady plays organ for church-no children-good friends and brother in law next door

## 2013-04-04 ENCOUNTER — Other Ambulatory Visit: Payer: Self-pay

## 2013-04-04 ENCOUNTER — Encounter (HOSPITAL_BASED_OUTPATIENT_CLINIC_OR_DEPARTMENT_OTHER)
Admission: RE | Admit: 2013-04-04 | Discharge: 2013-04-04 | Disposition: A | Discharge status: Home / Self Care | Payer: Medicare Other | Source: Ambulatory Visit | Attending: General Surgery | Admitting: General Surgery

## 2013-04-04 DIAGNOSIS — R928 Other abnormal and inconclusive findings on diagnostic imaging of breast: Secondary | ICD-10-CM | POA: Diagnosis not present

## 2013-04-04 DIAGNOSIS — Z853 Personal history of malignant neoplasm of breast: Secondary | ICD-10-CM | POA: Diagnosis not present

## 2013-04-04 DIAGNOSIS — Z01810 Encounter for preprocedural cardiovascular examination: Secondary | ICD-10-CM | POA: Insufficient documentation

## 2013-04-04 DIAGNOSIS — N649 Disorder of breast, unspecified: Secondary | ICD-10-CM | POA: Diagnosis present

## 2013-04-04 LAB — BASIC METABOLIC PANEL
BUN: 18 mg/dL (ref 6–23)
Calcium: 9.9 mg/dL (ref 8.4–10.5)
Creatinine, Ser: 0.78 mg/dL (ref 0.50–1.10)
GFR calc Af Amer: >90 mL/min (ref >90)
GFR calc non Af Amer: 78 mL/min — ABNORMAL LOW (ref >90)

## 2013-04-09 ENCOUNTER — Encounter (HOSPITAL_BASED_OUTPATIENT_CLINIC_OR_DEPARTMENT_OTHER): Payer: Self-pay | Admitting: *Deleted

## 2013-04-09 ENCOUNTER — Encounter (HOSPITAL_BASED_OUTPATIENT_CLINIC_OR_DEPARTMENT_OTHER)
Admission: RE | Disposition: A | Discharge status: Home / Self Care | Payer: Self-pay | Source: Ambulatory Visit | Attending: General Surgery

## 2013-04-09 ENCOUNTER — Ambulatory Visit
Admission: RE | Admit: 2013-04-09 | Discharge: 2013-04-09 | Disposition: A | Discharge status: Home / Self Care | Payer: Medicare Other | Source: Ambulatory Visit | Attending: General Surgery | Admitting: General Surgery

## 2013-04-09 ENCOUNTER — Ambulatory Visit (HOSPITAL_BASED_OUTPATIENT_CLINIC_OR_DEPARTMENT_OTHER): Payer: Medicare Other | Admitting: *Deleted

## 2013-04-09 ENCOUNTER — Ambulatory Visit (HOSPITAL_BASED_OUTPATIENT_CLINIC_OR_DEPARTMENT_OTHER)
Admission: RE | Admit: 2013-04-09 | Discharge: 2013-04-09 | Disposition: A | Discharge status: Home / Self Care | Payer: Medicare Other | Source: Ambulatory Visit | Attending: General Surgery | Admitting: General Surgery

## 2013-04-09 DIAGNOSIS — N649 Disorder of breast, unspecified: Secondary | ICD-10-CM | POA: Diagnosis not present

## 2013-04-09 DIAGNOSIS — R920 Mammographic microcalcification found on diagnostic imaging of breast: Secondary | ICD-10-CM

## 2013-04-09 DIAGNOSIS — Z853 Personal history of malignant neoplasm of breast: Secondary | ICD-10-CM

## 2013-04-09 DIAGNOSIS — D4860 Neoplasm of uncertain behavior of unspecified breast: Secondary | ICD-10-CM

## 2013-04-09 DIAGNOSIS — R928 Other abnormal and inconclusive findings on diagnostic imaging of breast: Secondary | ICD-10-CM | POA: Insufficient documentation

## 2013-04-09 HISTORY — DX: Lymphoid leukemia, unspecified not having achieved remission: C91.90

## 2013-04-09 HISTORY — PX: BREAST LUMPECTOMY WITH NEEDLE LOCALIZATION: SHX5759

## 2013-04-09 HISTORY — DX: Heart failure, unspecified: I50.9

## 2013-04-09 HISTORY — DX: Unspecified hearing loss, unspecified ear: H91.90

## 2013-04-09 HISTORY — DX: Essential (primary) hypertension: I10

## 2013-04-09 HISTORY — DX: Hyperlipidemia, unspecified: E78.5

## 2013-04-09 HISTORY — DX: Other specified disorders of bone density and structure, unspecified site: M85.80

## 2013-04-09 HISTORY — DX: Unspecified right bundle-branch block: I45.10

## 2013-04-09 HISTORY — DX: Anemia, unspecified: D64.9

## 2013-04-09 HISTORY — DX: Unspecified osteoarthritis, unspecified site: M19.90

## 2013-04-09 HISTORY — DX: Respiratory tuberculosis unspecified: A15.9

## 2013-04-09 HISTORY — DX: Malignant neoplasm of unspecified site of unspecified female breast: C50.919

## 2013-04-09 LAB — POCT HEMOGLOBIN-HEMACUE: Hemoglobin: 12 g/dL (ref 12.0–15.0)

## 2013-04-09 SURGERY — BREAST LUMPECTOMY WITH NEEDLE LOCALIZATION
Anesthesia: General | Site: Breast | Laterality: Left | Wound class: Clean

## 2013-04-09 MED ORDER — SCOPOLAMINE 1 MG/3DAYS TD PT72
1.0000 | MEDICATED_PATCH | TRANSDERMAL | Status: DC
  Administered 2013-04-09: 1.5 mg via TRANSDERMAL

## 2013-04-09 MED ORDER — DEXAMETHASONE SODIUM PHOSPHATE 4 MG/ML IJ SOLN
INTRAMUSCULAR | Status: DC | PRN
  Administered 2013-04-09: 5 mg via INTRAVENOUS

## 2013-04-09 MED ORDER — FENTANYL CITRATE 0.05 MG/ML IJ SOLN
INTRAMUSCULAR | Status: DC | PRN
  Administered 2013-04-09: 50 ug via INTRAVENOUS
  Administered 2013-04-09: 25 ug via INTRAVENOUS

## 2013-04-09 MED ORDER — CHLORHEXIDINE GLUCONATE 4 % EX LIQD: 1.0000 "application " | Freq: Once | CUTANEOUS | Status: DC

## 2013-04-09 MED ORDER — PROPOFOL 10 MG/ML IV BOLUS
INTRAVENOUS | Status: DC | PRN
  Administered 2013-04-09: 150 mg via INTRAVENOUS

## 2013-04-09 MED ORDER — ONDANSETRON HCL 4 MG/2ML IJ SOLN
INTRAMUSCULAR | Status: DC | PRN
  Administered 2013-04-09: 4 mg via INTRAVENOUS

## 2013-04-09 MED ORDER — FENTANYL CITRATE 0.05 MG/ML IJ SOLN: 50.0000 ug | INTRAMUSCULAR | Status: DC | PRN

## 2013-04-09 MED ORDER — BUPIVACAINE-EPINEPHRINE 0.25% -1:200000 IJ SOLN
INTRAMUSCULAR | Status: DC | PRN
  Administered 2013-04-09: 20 mL

## 2013-04-09 MED ORDER — EPHEDRINE SULFATE 50 MG/ML IJ SOLN
INTRAMUSCULAR | Status: DC | PRN
  Administered 2013-04-09: 10 mg via INTRAVENOUS
  Administered 2013-04-09: 5 mg via INTRAVENOUS

## 2013-04-09 MED ORDER — ONDANSETRON HCL 4 MG/2ML IJ SOLN: 4.0000 mg | Freq: Once | INTRAMUSCULAR | Status: DC | PRN

## 2013-04-09 MED ORDER — FENTANYL CITRATE 0.05 MG/ML IJ SOLN: 25.0000 ug | INTRAMUSCULAR | Status: DC | PRN

## 2013-04-09 MED ORDER — CEFAZOLIN SODIUM-DEXTROSE 2-3 GM-% IV SOLR
2.0000 g | INTRAVENOUS | Status: AC
  Administered 2013-04-09: 2 g via INTRAVENOUS

## 2013-04-09 MED ORDER — MIDAZOLAM HCL 2 MG/2ML IJ SOLN: 1.0000 mg | INTRAMUSCULAR | Status: DC | PRN

## 2013-04-09 MED ORDER — TRAMADOL HCL 50 MG PO TABS: 50.0000 mg | ORAL_TABLET | Freq: Four times a day (QID) | ORAL | Status: DC | PRN

## 2013-04-09 MED ORDER — LIDOCAINE HCL (CARDIAC) 20 MG/ML IV SOLN
INTRAVENOUS | Status: DC | PRN
  Administered 2013-04-09: 80 mg via INTRAVENOUS

## 2013-04-09 MED ORDER — LACTATED RINGERS IV SOLN
INTRAVENOUS | Status: DC
  Administered 2013-04-09: 09:00:00 via INTRAVENOUS

## 2013-04-09 SURGICAL SUPPLY — 47 items
BINDER BREAST LRG (GAUZE/BANDAGES/DRESSINGS) ×2 IMPLANT
BLADE SURG 10 STRL SS (BLADE) IMPLANT
BLADE SURG 15 STRL LF DISP TIS (BLADE) ×1 IMPLANT
BLADE SURG 15 STRL SS (BLADE) ×1
CANISTER SUCTION 1200CC (MISCELLANEOUS) IMPLANT
CHLORAPREP W/TINT 26ML (MISCELLANEOUS) ×2 IMPLANT
CLIP TI MEDIUM 6 (CLIP) IMPLANT
CLIP TI WIDE RED SMALL 6 (CLIP) IMPLANT
CLOTH BEACON ORANGE TIMEOUT ST (SAFETY) ×2 IMPLANT
COVER MAYO STAND STRL (DRAPES) ×2 IMPLANT
COVER TABLE BACK 60X90 (DRAPES) ×2 IMPLANT
DERMABOND ADVANCED (GAUZE/BANDAGES/DRESSINGS) ×1
DERMABOND ADVANCED .7 DNX12 (GAUZE/BANDAGES/DRESSINGS) ×1 IMPLANT
DEVICE DUBIN W/COMP PLATE 8390 (MISCELLANEOUS) ×2 IMPLANT
DRAPE PED LAPAROTOMY (DRAPES) ×2 IMPLANT
DRAPE UTILITY XL STRL (DRAPES) ×2 IMPLANT
ELECT COATED BLADE 2.86 ST (ELECTRODE) ×2 IMPLANT
ELECT REM PT RETURN 9FT ADLT (ELECTROSURGICAL) ×2
ELECTRODE REM PT RTRN 9FT ADLT (ELECTROSURGICAL) ×1 IMPLANT
GLOVE BIOGEL PI IND STRL 7.0 (GLOVE) ×1 IMPLANT
GLOVE BIOGEL PI IND STRL 8 (GLOVE) ×1 IMPLANT
GLOVE BIOGEL PI INDICATOR 7.0 (GLOVE) ×1
GLOVE BIOGEL PI INDICATOR 8 (GLOVE) ×1
GLOVE ECLIPSE 6.5 STRL STRAW (GLOVE) ×2 IMPLANT
GLOVE SS BIOGEL STRL SZ 7.5 (GLOVE) ×1 IMPLANT
GLOVE SUPERSENSE BIOGEL SZ 7.5 (GLOVE) ×1
GOWN PREVENTION PLUS XLARGE (GOWN DISPOSABLE) ×2 IMPLANT
GOWN PREVENTION PLUS XXLARGE (GOWN DISPOSABLE) ×2 IMPLANT
KIT MARKER MARGIN INK (KITS) IMPLANT
NEEDLE HYPO 25X1 1.5 SAFETY (NEEDLE) ×2 IMPLANT
NS IRRIG 1000ML POUR BTL (IV SOLUTION) ×2 IMPLANT
PACK BASIN DAY SURGERY FS (CUSTOM PROCEDURE TRAY) ×2 IMPLANT
PENCIL BUTTON HOLSTER BLD 10FT (ELECTRODE) ×2 IMPLANT
SLEEVE SCD COMPRESS KNEE MED (MISCELLANEOUS) ×2 IMPLANT
STAPLER VISISTAT 35W (STAPLE) IMPLANT
SUT MON AB 3-0 SH 27 (SUTURE)
SUT MON AB 3-0 SH27 (SUTURE) IMPLANT
SUT MON AB 5-0 PS2 18 (SUTURE) ×2 IMPLANT
SUT SILK 3 0 SH 30 (SUTURE) IMPLANT
SUT VIC AB 4-0 BRD 54 (SUTURE) IMPLANT
SUT VICRYL 3-0 CR8 SH (SUTURE) ×2 IMPLANT
SYR BULB 3OZ (MISCELLANEOUS) IMPLANT
SYR CONTROL 10ML LL (SYRINGE) ×2 IMPLANT
TOWEL OR 17X24 6PK STRL BLUE (TOWEL DISPOSABLE) ×4 IMPLANT
TOWEL OR NON WOVEN STRL DISP B (DISPOSABLE) ×2 IMPLANT
TUBE CONNECTING 20X1/4 (TUBING) IMPLANT
YANKAUER SUCT BULB TIP NO VENT (SUCTIONS) IMPLANT

## 2013-04-09 NOTE — Transfer of Care (Signed)
Immediate Anesthesia Transfer of Care Note  Patient: Margaret Mann  Procedure(s) Performed: Procedure(s): BREAST LUMPECTOMY WITH NEEDLE LOCALIZATION (Left)  Patient Location: PACU  Anesthesia Type:General  Level of Consciousness: awake, alert  and oriented  Airway & Oxygen Therapy: Patient Spontanous Breathing and Patient connected to face mask oxygen  Post-op Assessment: Report given to PACU RN, Post -op Vital signs reviewed and stable and Patient moving all extremities  Post vital signs: Reviewed and stable  Complications: No apparent anesthesia complications

## 2013-04-09 NOTE — Op Note (Signed)
Preoperative Diagnosis: breast  Postoprative Diagnosis: breast  Procedure: Procedure(s): BREAST LUMPECTOMY WITH NEEDLE LOCALIZATION   Surgeon: Glenna Fellows T   Assistants: None  Anesthesia:  General LMA anesthesia  Indications: The patient is a 79 year old female with a personal history of ductal carcinoma in situ of the right breast. Recent screening mammogram showed an abnormal cluster of microcalcifications in the lower outer left breast. A large core needle biopsy was performed which has revealed a complex sclerosing lesion. Complete excision of the area flat malignancy has been recommended and the patient is in agreement. Indications and risks have been discussed in detail the extensively elsewhere.    Procedure Detail:  Following accurate needle localization the patient was brought to the operating room, placed in the supine position on the operating table, and general endotracheal anesthesia induced. The left breast was widely sterilely prepped and draped. She received preoperative IV antibiotics. PAS were in place. The patient Timeout was performed and correct procedure verified. A curvilinear incision was made at the wire site and dissected carried down to the subcutaneous tissue. Using cautery I excised a generous core of breast tissue around the shaft and tip of the wire. The specimen was oriented with sutures. The specimen radiograph was obtained showing the calcifications and intact wire and clip within the specimen. Hemostasis was obtained in the wound with cautery and figure-of-eight suture of 3-0 Vicryl. The soft tissue was infiltrated with Marcaine. The breast and subcutaneous tissue were then closed with interrupted 3-0 Vicryl and the skin with a running subcuticular 5-0 Monocryl and Dermabond. Sponge needle and instrument counts were correct.    Findings: As above  Estimated Blood Loss:  Minimal         Drains: none  Blood Given: none          Specimens: Left  breast lumpectomy        Complications:  * No complications entered in OR log *         Disposition: PACU - hemodynamically stable.         Condition: stable

## 2013-04-09 NOTE — Discharge Instructions (Signed)
[facility] °Office Phone Number [phone] ° °BREAST BIOPSY/ PARTIAL MASTECTOMY: POST OP INSTRUCTIONS ° °Always review your discharge instruction sheet given to you by the facility where your surgery was performed. ° °IF YOU HAVE DISABILITY OR FAMILY LEAVE FORMS, YOU MUST BRING THEM TO THE OFFICE FOR PROCESSING.  DO NOT GIVE THEM TO YOUR DOCTOR. ° °1. A prescription for pain medication may be given to you upon discharge.  Take your pain medication as prescribed, if needed.  If narcotic pain medicine is not needed, then you may take acetaminophen (Tylenol) or ibuprofen (Advil) as needed. °2. Take your usually prescribed medications unless otherwise directed °3. If you need a refill on your pain medication, please contact your pharmacy.  They will contact our office to request authorization.  Prescriptions will not be filled after 5pm or on week-ends. °4. You should eat very light the first 24 hours after surgery, such as soup, crackers, pudding, etc.  Resume your normal diet the day after surgery. °5. Most patients will experience some swelling and bruising in the breast.  Ice packs and a good support bra will help.  Swelling and bruising can take several days to resolve.  °6. It is common to experience some constipation if taking pain medication after surgery.  Increasing fluid intake and taking a stool softener will usually help or prevent this problem from occurring.  A mild laxative (Milk of Magnesia or Miralax) should be taken according to package directions if there are no bowel movements after 48 hours. °7. Unless discharge instructions indicate otherwise, you may remove your bandages 24-48 hours after surgery, and you may shower at that time.  You may have steri-strips (small skin tapes) in place directly over the incision.  These strips should be left on the skin for 7-10 days.  If your surgeon used skin glue on the incision, you may shower in 24 hours.  The glue will flake off over the  next 2-3 weeks.  Any sutures or staples will be removed at the office during your follow-up visit. °8. ACTIVITIES:  You may resume regular daily activities (gradually increasing) beginning the next day.  Wearing a good support bra or sports bra minimizes pain and swelling.  You may have sexual intercourse when it is comfortable. °a. You may drive when you no longer are taking prescription pain medication, you can comfortably wear a seatbelt, and you can safely maneuver your car and apply brakes. °b. RETURN TO WORK:  ______________________________________________________________________________________ °9. You should see your doctor in the office for a follow-up appointment approximately two weeks after your surgery.  Your doctor’s nurse will typically make your follow-up appointment when she calls you with your pathology report.  Expect your pathology report 2-3 business days after your surgery.  You may call to check if you do not hear from us after three days. °10. OTHER INSTRUCTIONS: _______________________________________________________________________________________________ _____________________________________________________________________________________________________________________________________ °_____________________________________________________________________________________________________________________________________ °_____________________________________________________________________________________________________________________________________ ° °WHEN TO CALL YOUR DOCTOR: °1. Fever over 101.0 °2. Nausea and/or vomiting. °3. Extreme swelling or bruising. °4. Continued bleeding from incision. °5. Increased pain, redness, or drainage from the incision. ° °The clinic staff is available to answer your questions during regular business hours.  Please don’t hesitate to call and ask to speak to one of the nurses for clinical concerns.  If you have a medical emergency, go to the nearest  emergency room or call 911.  A surgeon from [facility] is always on call at the hospital. ° °For further questions, please visit [email or web address]  ° ° °  Post Anesthesia Home Care Instructions ° °Activity: °Get plenty of rest for the remainder of the day. A responsible adult should stay with you for 24 hours following the procedure.  °For the next 24 hours, DO NOT: °-Drive a car °-Operate machinery °-Drink alcoholic beverages °-Take any medication unless instructed by your physician °-Make any legal decisions or sign important papers. ° °Meals: °Start with liquid foods such as gelatin or soup. Progress to regular foods as tolerated. Avoid greasy, spicy, heavy foods. If nausea and/or vomiting occur, drink only clear liquids until the nausea and/or vomiting subsides. Call your physician if vomiting continues. ° °Special Instructions/Symptoms: °Your throat may feel dry or sore from the anesthesia or the breathing tube placed in your throat during surgery. If this causes discomfort, gargle with warm salt water. The discomfort should disappear within 24 hours. ° °

## 2013-04-09 NOTE — Anesthesia Postprocedure Evaluation (Signed)
  Anesthesia Post-op Note  Patient: Margaret Mann  Procedure(s) Performed: Procedure(s): BREAST LUMPECTOMY WITH NEEDLE LOCALIZATION (Left)  Patient Location: PACU  Anesthesia Type:General  Level of Consciousness: awake, alert  and oriented  Airway and Oxygen Therapy: Patient Spontanous Breathing  Post-op Pain: mild  Post-op Assessment: Post-op Vital signs reviewed  Post-op Vital Signs: Reviewed  Complications: No apparent anesthesia complications

## 2013-04-09 NOTE — Interval H&P Note (Signed)
History and Physical Interval Note:  04/09/2013 9:26 AM  Margaret Mann  has presented today for surgery, with the diagnosis of abnormal mammogram left breast  The various methods of treatment have been discussed with the patient and family. After consideration of risks, benefits and other options for treatment, the patient has consented to  Procedure(s): BREAST LUMPECTOMY WITH NEEDLE LOCALIZATION (Left) as a surgical intervention .  The patient's history has been reviewed, patient examined, no change in status, stable for surgery.  I have reviewed the patient's chart and labs.  Questions were answered to the patient's satisfaction.     Fillmore Bynum T

## 2013-04-09 NOTE — Anesthesia Procedure Notes (Signed)
Procedure Name: LMA Insertion Date/Time: 04/09/2013 9:51 AM Performed by: Meyer Russel Pre-anesthesia Checklist: Patient identified, Emergency Drugs available, Suction available and Patient being monitored Patient Re-evaluated:Patient Re-evaluated prior to inductionOxygen Delivery Method: Circle System Utilized Preoxygenation: Pre-oxygenation with 100% oxygen Intubation Type: IV induction Ventilation: Mask ventilation without difficulty LMA: LMA inserted LMA Size: 4.0 Number of attempts: 1 Airway Equipment and Method: bite block Placement Confirmation: positive ETCO2 and breath sounds checked- equal and bilateral Tube secured with: Tape Dental Injury: Teeth and Oropharynx as per pre-operative assessment

## 2013-04-09 NOTE — H&P (View-Only) (Signed)
Subjective:   Abnormal mammogram left breast and complex sclerosing lesion on core biopsy  Patient ID: Margaret Mann, female   DOB: 04-08-1933, 79 y.o.   MRN: [id number]  HPI Patient is a 79 year old female well known to me with a history of ductal carcinoma in situ of the right breast. She underwent lumpectomy, radiation and tamoxifen with a diagnosis in 2007. She subsequently has had a diagnosis of uterine cancer, status post hysterectomy chemotherapy and radiation diagnosed in 2010 and also chronic lymphocytic leukemia with recent course of chemotherapy and now observation. Recent mammogram showed a new area of suspicious calcifications in the upper outer quadrant of the left breast. The area measured a maximum of 1.7 cm. Stereotactic core biopsy was recommended and performed. This has revealed a complex sclerosing lesion with calcifications. Excisional biopsy has been recommended and she was referred by Dr. Chilton Si for consideration for this. She has not noted any breast lumps or nipple discharge or breast pain or other worrisome symptoms. Despite her extensive history as above she currently is feeling pretty well and is very active.  No past medical history on file. Past Surgical History  Procedure Laterality Date  . Biopsy breast  2008  . Endometrial biopsy  2010   Current Outpatient Prescriptions  Medication Sig Dispense Refill  . aspirin 81 MG tablet Take 81 mg by mouth daily.      Marland Kitchen atorvastatin (LIPITOR) 40 MG tablet       . calcium carbonate (OS-CAL) 600 MG TABS Take 600 mg by mouth 2 (two) times daily with a meal.      . cetirizine (ZYRTEC) 10 MG tablet Take 10 mg by mouth daily.      . Cholecalciferol (VITAMIN D3) 2000 UNITS TABS Take 1 tablet by mouth daily.      . hydrochlorothiazide (HYDRODIURIL) 25 MG tablet Take 25 mg by mouth daily.      Marland Kitchen lisinopril (PRINIVIL,ZESTRIL) 5 MG tablet Take 5 mg by mouth daily.      . Multiple Vitamin (MULTIVITAMIN) capsule Take 1 capsule by mouth  daily.      . potassium chloride SA (K-DUR,KLOR-CON) 20 MEQ tablet Take 1 tablet (20 mEq total) by mouth 2 (two) times daily.  60 tablet  4  . acyclovir (ZOVIRAX) 400 MG tablet TAKE 1 TABLET DAILY.  30 tablet  3  . amLODipine-atorvastatin (CADUET) 10-40 MG per tablet Take 1 tablet by mouth daily.      Marland Kitchen dexamethasone (DECADRON) 4 MG tablet Take 2 tablets (8 mg total) by mouth 2 (two) times daily with a meal. Take daily starting the day after chemotherapy for 2 days. Take with food.  30 tablet  1  . LORazepam (ATIVAN) 0.5 MG tablet Take 1 tablet (0.5 mg total) by mouth every 6 (six) hours as needed (Nausea or vomiting).  30 tablet  0  . ondansetron (ZOFRAN) 8 MG tablet Take 1 tablet (8 mg total) by mouth 2 (two) times daily. Take two times a day starting the day after chemo for 2 days. Then take two times a day as needed for nausea or vomiting.  30 tablet  1  . prednisoLONE acetate (PRED FORTE) 1 % ophthalmic suspension       . VIGAMOX 0.5 % ophthalmic solution        No current facility-administered medications for this visit.   Allergies  Allergen Reactions  . Dilaudid [Hydromorphone Hcl] Nausea And Vomiting   History  Substance Use Topics  . Smoking status:  Former Smoker  . Smokeless tobacco: Not on file     Comment: 1960s   . Alcohol Use: Not on file     Review of Systems  Constitutional: Positive for fatigue.  Respiratory: Negative.   Cardiovascular: Negative.   Gastrointestinal: Negative.        Objective:   Physical Exam BP 132/74  Pulse 70  Temp(Src) 98 F (36.7 C)  Resp 18  Ht 5\' 4"  (1.626 m)  Wt 147 lb (66.679 kg)  BMI 25.22 kg/m2 General: Alert, well-developed Caucasian female, in no distress Skin: Warm and dry without rash or infection. HEENT: No palpable masses or thyromegaly. Sclera nonicteric. Pupils equal round and reactive. Oropharynx clear. Breasts: Slight bruising lateral left breast. I cannot feel any masses in either breast. Lymph nodes: No cervical,  supraclavicular, or inguinal nodes palpable. Lungs: Breath sounds clear and equal without increased work of breathing Cardiovascular: Regular rate and rhythm without murmur. No JVD or edema. Peripheral pulses intact. Abdomen: Nondistended. Soft and nontender. No masses palpable. No organomegaly. No palpable hernias. Extremities: No edema or joint swelling or deformity. No chronic venous stasis changes. Neurologic: Alert and fully oriented. Gait normal.    Assessment:     Recent finding of abnormal calcifications in the left breast a biopsy showing complex sclerosing lesion. Personal history of DCIS of the right breast. We discussed options including continued followup imaging versus excisional biopsy to rule out underlying malignancy. She strongly prefers excisional biopsy to rule out the small chance of underlying malignancy I think this is reasonable. We discussed needle localized excisional biopsy under general anesthesia as an outpatient. Risks of bleeding and infection and anesthetic complications were discussed and understood.    Plan:     Needle localized left breast lumpectomy under general anesthesia as an outpatient

## 2013-04-09 NOTE — Anesthesia Preprocedure Evaluation (Signed)
Anesthesia Evaluation  Patient identified by MRN, date of birth, ID band Patient awake    Reviewed: Allergy & Precautions, H&P , NPO status , Patient's Chart, lab work & pertinent test results  Airway Mallampati: I TM Distance: >3 FB Neck ROM: Full    Dental  (+) Teeth Intact and Dental Advisory Given   Pulmonary  breath sounds clear to auscultation        Cardiovascular hypertension, Pt. on medications Rhythm:Regular Rate:Normal     Neuro/Psych    GI/Hepatic   Endo/Other    Renal/GU      Musculoskeletal   Abdominal   Peds  Hematology   Anesthesia Other Findings   Reproductive/Obstetrics                           Anesthesia Physical Anesthesia Plan  ASA: III  Anesthesia Plan: General   Post-op Pain Management:    Induction: Intravenous  Airway Management Planned: LMA  Additional Equipment:   Intra-op Plan:   Post-operative Plan: Extubation in OR  Informed Consent: I have reviewed the patients History and Physical, chart, labs and discussed the procedure including the risks, benefits and alternatives for the proposed anesthesia with the patient or authorized representative who has indicated his/her understanding and acceptance.   Dental advisory given  Plan Discussed with: CRNA, Anesthesiologist and Surgeon  Anesthesia Plan Comments:         Anesthesia Quick Evaluation  

## 2013-04-10 ENCOUNTER — Encounter (HOSPITAL_BASED_OUTPATIENT_CLINIC_OR_DEPARTMENT_OTHER): Payer: Self-pay | Admitting: General Surgery

## 2013-04-12 ENCOUNTER — Telehealth (INDEPENDENT_AMBULATORY_CARE_PROVIDER_SITE_OTHER): Payer: Self-pay

## 2013-04-12 NOTE — Telephone Encounter (Signed)
Patient calling into office requesting pathology results.  Benign results given per Dr. Johna Sheriff.  Patient has follow up appointment on 05/08/13 @ 2:30 pm with Dr. Johna Sheriff.

## 2013-05-04 ENCOUNTER — Other Ambulatory Visit: Payer: Medicare Other | Admitting: Lab

## 2013-05-06 ENCOUNTER — Ambulatory Visit (HOSPITAL_BASED_OUTPATIENT_CLINIC_OR_DEPARTMENT_OTHER): Payer: Medicare Other | Admitting: Oncology

## 2013-05-06 ENCOUNTER — Other Ambulatory Visit (HOSPITAL_BASED_OUTPATIENT_CLINIC_OR_DEPARTMENT_OTHER): Payer: Medicare Other | Admitting: Lab

## 2013-05-06 ENCOUNTER — Telehealth: Payer: Self-pay | Admitting: Oncology

## 2013-05-06 VITALS — BP 114/45 | HR 66 | Temp 97.5°F | Resp 18 | Ht 64.0 in | Wt 151.0 lb

## 2013-05-06 DIAGNOSIS — C55 Malignant neoplasm of uterus, part unspecified: Secondary | ICD-10-CM

## 2013-05-06 DIAGNOSIS — C9110 Chronic lymphocytic leukemia of B-cell type not having achieved remission: Secondary | ICD-10-CM

## 2013-05-06 DIAGNOSIS — D0592 Unspecified type of carcinoma in situ of left breast: Secondary | ICD-10-CM

## 2013-05-06 DIAGNOSIS — D0590 Unspecified type of carcinoma in situ of unspecified breast: Secondary | ICD-10-CM

## 2013-05-06 DIAGNOSIS — C549 Malignant neoplasm of corpus uteri, unspecified: Secondary | ICD-10-CM

## 2013-05-06 LAB — COMPREHENSIVE METABOLIC PANEL (CC13)
ALT: 12 U/L (ref 0–55)
Alkaline Phosphatase: 67 U/L (ref 40–150)
Anion Gap: 9 mEq/L (ref 3–11)
BUN: 17.9 mg/dL (ref 7.0–26.0)
CO2: 27 mEq/L (ref 22–29)
Creatinine: 0.8 mg/dL (ref 0.6–1.1)
Glucose: 157 mg/dl — ABNORMAL HIGH (ref 70–140)
Sodium: 139 mEq/L (ref 136–145)
Total Bilirubin: 0.46 mg/dL (ref 0.20–1.20)
Total Protein: 6.9 g/dL (ref 6.4–8.3)

## 2013-05-06 LAB — CBC WITH DIFFERENTIAL/PLATELET
BASO%: 0.4 % (ref 0.0–2.0)
EOS%: 3 % (ref 0.0–7.0)
LYMPH%: 44.1 % (ref 14.0–49.7)
MCH: 29.5 pg (ref 25.1–34.0)
MCHC: 33 g/dL (ref 31.5–36.0)
MCV: 89.5 fL (ref 79.5–101.0)
MONO#: 1.2 10*3/uL — ABNORMAL HIGH (ref 0.1–0.9)
MONO%: 12.7 % (ref 0.0–14.0)
NEUT#: 3.6 10*3/uL (ref 1.5–6.5)
NEUT%: 39.8 % (ref 38.4–76.8)
Platelets: 158 10*3/uL (ref 145–400)
RBC: 4.2 10*6/uL (ref 3.70–5.45)
RDW: 14.4 % (ref 11.2–14.5)

## 2013-05-06 LAB — LACTATE DEHYDROGENASE (CC13): LDH: 168 U/L (ref 125–245)

## 2013-05-06 NOTE — Telephone Encounter (Signed)
gv and printed appt sched and avs for pt for Dec and Feb    °

## 2013-05-06 NOTE — Progress Notes (Signed)
Hematology and Oncology Follow Up Visit  Margaret Mann [id number] 06-Jan-1933 79 y.o. 05/06/2013 8:25 PM   Principle Diagnosis: Encounter Diagnoses  Name Primary?  . CLL (chronic lymphocytic leukemia) Yes  . Carcinoma in situ of breast, left   . Uterine cancer      Interim History:   Followup visit for this pleasant 79 year old woman with history of 3 primary malignancies: Breast, and endometrial cancers, as well as chronic lymphocytic leukemia. Please see my 08/01/2012 summary note for complete details. Briefly: Diagnosis of DCIS right breast December 2007 strongly ER/PR positive status post lumpectomy 07/12/2006. Postop radiation followed by tamoxifen. Right axillary lymph node enlargement found at that time with biopsy showing CLL versus WDLL  Endometrial cancer stage IA, grade 3, mixed endometrioid and clear cell , diagnosed January 2010 status post robotic hysterectomy with BSO 09/02/2008 followed by postoperative adjuvant chemotherapy with carboplatinum plus Taxol then brachytherapy to the vaginal vault between May 13 and 11/27/08.  CLL initial stage I treated with observation until progression in July 13 with rising white count and fall in hemoglobin. 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 Treatment with 6 cycles of Bendamustine  plus Rituxan between 05/03/2012 and 09/21/2012 with a complete response.  Recent routine mammogram done 02/28/2013 showed a new area of clustered calcifications in the lateral aspect of the left breast. A biopsy done on September 4 showed a complex changes with fibroadenoma and fibrocystic changes. Incisional biopsy was and a lumpectomy was done on September 30 showing identical findings to the needle biopsy with no invasive cancer but presence of lobular carcinoma in situ and a complex sclerosing lesion.   Medications: reviewed  Allergies:  Allergies  Allergen Reactions  . Dilaudid [Hydromorphone Hcl] Nausea And  Vomiting    Review of Systems: Hematology: negative for swollen glands, easy bruising, ENT ROS: negative for - oral lesions or sore throat Breast ROS: See above Respiratory ROS: negative for - cough, pleuritic pain, shortness of breath or wheezing Cardiovascular ROS: negative for - chest pain, dyspnea on exertion, edema, irregular heartbeat, murmur, orthopnea, palpitations, paroxysmal nocturnal dyspnea or rapid heart rate Gastrointestinal ROS: negative for - abdominal pain, appetite loss, blood in stools, change in bowel habits, constipation, diarrhea, heartburn, hematemesis, melena, nausea/vomiting or swallowing difficulty/pain Genito-Urinary ROS: negative for - , dysuria, hematuria, incontinence,  nocturia or urinary frequency/urgency Musculoskeletal ROS: negative for - joint pain, joint stiffness, joint swelling, muscle pain, muscular weakness  Neurological ROS: negative for - behavioral changes, confusion, dizziness, gait disturbance, headaches, impaired coordination/balance, memory loss, numbness/tingling,  Dermatological ROS: negative for rash, ecchymosis  She had cataract surgery on July 8 and again on August 12 by [facility] ophthalmology. Remaining ROS negative.  Physical Exam: Blood pressure 114/45, pulse 66, temperature 97.5 F (36.4 C), temperature source Oral, resp. rate 18, height 5\' 4"  (1.626 m), weight 151 lb (68.493 kg), SpO2 97.00%. Wt Readings from Last 3 Encounters:  05/06/13 151 lb (68.493 kg)  04/09/13 149 lb (67.586 kg)  04/09/13 149 lb (67.586 kg)     General appearance: Well-nourished Caucasian woman HENNT: Pharynx no erythema, exudate, mass, or ulcer. No thyromegaly or thyroid nodules Lymph nodes: No cervical, supraclavicular, or axillary lymphadenopathy Breasts: Surgical changes in both breasts. Inversion of the right nipple chronic since previous surgery. New and well-healed scar left breast from recent biopsy. Lungs: Clear to auscultation, resonant to  percussion throughout Heart: Regular rhythm, no murmur, no gallop, no rub, no click, no edema Abdomen: Soft, nontender, normal bowel  sounds, no mass, no organomegaly Extremities: No edema, no calf tenderness Musculoskeletal: no joint deformities GU: Vascular: Carotid pulses 2+, no bruits, distal pulses: Dorsalis pedis 1+ symmetric Neurologic: Alert, oriented, PERRLA, , cranial nerves grossly normal, motor strength 5 over 5, reflexes 1+ symmetric, upper body coordination normal, gait normal, Skin: No rash or ecchymosis  Lab Results: CBC W/Diff  White count differential: 40% neutrophils, 44% lymphocytes, 13% monocytes   Component Value Date/Time   WBC 9.0 05/06/2013 1142   WBC 69.3* 01/31/2012 0810   RBC 4.20 05/06/2013 1142   RBC 3.64* 01/31/2012 0810   HGB 12.4 05/06/2013 1142   HGB 12.0 04/09/2013 0914   HCT 37.6 05/06/2013 1142   HCT 34.3* 01/31/2012 0810   PLT 158 05/06/2013 1142   PLT 236 01/31/2012 0810   MCV 89.5 05/06/2013 1142   MCV 94.2 01/31/2012 0810   MCH 29.5 05/06/2013 1142   MCH 30.8 01/31/2012 0810   MCHC 33.0 05/06/2013 1142   MCHC 32.7 01/31/2012 0810   RDW 14.4 05/06/2013 1142   RDW 14.9 01/31/2012 0810   LYMPHSABS 4.0* 05/06/2013 1142   LYMPHSABS 56.8* 01/31/2012 0810   MONOABS 1.2* 05/06/2013 1142   MONOABS 4.2* 01/31/2012 0810   EOSABS 0.3 05/06/2013 1142   EOSABS 0.0 01/31/2012 0810   BASOSABS 0.0 05/06/2013 1142   BASOSABS 0.0 01/31/2012 0810     Chemistry      Component Value Date/Time   NA 139 05/06/2013 1142   NA 135 04/04/2013 1000   K 4.0 05/06/2013 1142   K 4.2 04/04/2013 1000   CL 98 04/04/2013 1000   CL 101 11/05/2012 0923   CO2 27 05/06/2013 1142   CO2 30 04/04/2013 1000   BUN 17.9 05/06/2013 1142   BUN 18 04/04/2013 1000   CREATININE 0.8 05/06/2013 1142   CREATININE 0.78 04/04/2013 1000      Component Value Date/Time   CALCIUM 9.5 05/06/2013 1142   CALCIUM 9.9 04/04/2013 1000   ALKPHOS 67 05/06/2013 1142   ALKPHOS 55 03/02/2012 0938   AST 22  05/06/2013 1142   AST 26 03/02/2012 0938   ALT 12 05/06/2013 1142   ALT 12 03/02/2012 0938   BILITOT 0.46 05/06/2013 1142   BILITOT 0.5 03/02/2012 1610       Radiological Studies: Mm Lt Plc Breast Loc Dev   1st Lesion  Inc Mammo Guide  04/09/2013   CLINICAL DATA:  79 year old female with a complex sclerosing lesion in the left breast.  EXAM: RADIOLOGY EXAMINATION  COMPARISON:  Previous exams.  FINDINGS: Patient presents for needle localization prior to left lumpectomy. I met with the patient and we discussed the procedure of needle localization including benefits and alternatives. We discussed the high likelihood of a successful procedure. We discussed the risks of the procedure, including infection, bleeding, tissue injury, and further surgery. Informed, written consent was given. The usual time-out protocol was performed immediately prior to the procedure.  Using mammographic guidance, sterile technique, 2% lidocaine and a 7 cm modified Kopans needle, the dumbbell shaped biopsy clip localized using a lateral approach. The films were marked for Dr. Johna Sheriff.  Specimen radiograph was performed at [facility], and confirms the wire and biopsy marking clip present in the tissue sample. The specimen was marked for pathology.  IMPRESSION: Needle localization left breast. No apparent complications.   Electronically Signed   By: Edwin Cap M.D.   On: 04/09/2013 10:28    Impression: #1. New diagnosis lobular carcinoma in situ,  fibrocystic changes, sclerosing adenosis, and fibroadenoma, left breast.  #2. Previous diagnosis DCIS right breast and treated as outlined above.  #3. Stage IA, grade 2, endometrial carcinoma Recent followup with Dr. Clifton Lyndell Gillyard on 04/25/2013 with no abnormalities on physical exam and CA 125 tumor marker 7.9.  #4. Chronic lymphocytic leukemia Complete response to chemotherapy given through January of this year. Total white count today higher than baseline but  still in normal range. There is a trend for rise in lymphocyte percentage compare previous values. This may be a transient rise due to recent surgery. I will continue to monitor blood counts every 2 months with a clinical followup visit in 4 months. No indication to resume treatment at this time with normal hemoglobin and platelet count.  Health maintenance: She did have her flu vaccine on October 22 She is considering transferring all of her care here to [facility] and may be interested in seeing Dr. Darrold Span in the near future to monitor her breast and GYN malignancies.   CC: Patient Care Team: Maurice Small, MD as PCP - General   Levert Feinstein, MD 10/27/20148:25 PM

## 2013-05-08 ENCOUNTER — Encounter (INDEPENDENT_AMBULATORY_CARE_PROVIDER_SITE_OTHER): Payer: Self-pay | Admitting: General Surgery

## 2013-05-08 ENCOUNTER — Ambulatory Visit (INDEPENDENT_AMBULATORY_CARE_PROVIDER_SITE_OTHER): Payer: Medicare Other | Admitting: General Surgery

## 2013-05-08 VITALS — BP 110/62 | HR 68 | Resp 16 | Ht 64.5 in | Wt 147.4 lb

## 2013-05-08 DIAGNOSIS — Z09 Encounter for follow-up examination after completed treatment for conditions other than malignant neoplasm: Secondary | ICD-10-CM

## 2013-05-08 NOTE — Progress Notes (Signed)
History: Patient returns following left breast lumpectomy for calcifications and core biopsy showing a chronic sclerosing lesion with a personal history of breast cancer remotely. She reports no problems following the lobectomy.  Exam: BP 110/62  Pulse 68  Resp 16  Ht 5' 4.5" (1.638 m)  Wt 147 lb 6.4 oz (66.86 kg)  BMI 24.92 kg/m2 Exam shows the left breast wound well healed without complication.  Pathology confirmed sclerosing lesion without atypia or malignancy  Assessment and plan: Doing well without complication and fortunately benign pathology. She will continue routine screening and I will be available as needed.

## 2013-07-08 ENCOUNTER — Other Ambulatory Visit (HOSPITAL_BASED_OUTPATIENT_CLINIC_OR_DEPARTMENT_OTHER): Payer: Medicare Other

## 2013-07-08 ENCOUNTER — Telehealth: Payer: Self-pay | Admitting: *Deleted

## 2013-07-08 DIAGNOSIS — D0592 Unspecified type of carcinoma in situ of left breast: Secondary | ICD-10-CM

## 2013-07-08 DIAGNOSIS — C55 Malignant neoplasm of uterus, part unspecified: Secondary | ICD-10-CM

## 2013-07-08 DIAGNOSIS — D0590 Unspecified type of carcinoma in situ of unspecified breast: Secondary | ICD-10-CM

## 2013-07-08 DIAGNOSIS — C9110 Chronic lymphocytic leukemia of B-cell type not having achieved remission: Secondary | ICD-10-CM

## 2013-07-08 LAB — MANUAL DIFFERENTIAL
ALC: 10 10*3/uL — ABNORMAL HIGH (ref 0.9–3.3)
ANC (CHCC manual diff): 3.1 10*3/uL (ref 1.5–6.5)
Blasts: 0 % (ref 0–0)
LYMPH: 70 % — ABNORMAL HIGH (ref 14–49)
MONO: 6 % (ref 0–14)
Metamyelocytes: 0 % (ref 0–0)
Myelocytes: 0 % (ref 0–0)
Other Cell: 0 % (ref 0–0)
SEG: 22 % — ABNORMAL LOW (ref 38–77)
Variant Lymph: 0 % (ref 0–0)

## 2013-07-08 LAB — CBC & DIFF AND RETIC
HCT: 39.2 % (ref 34.8–46.6)
HGB: 13.2 g/dL (ref 11.6–15.9)
Immature Retic Fract: 8 % (ref 1.60–10.00)
MCH: 29.8 pg (ref 25.1–34.0)
MCHC: 33.7 g/dL (ref 31.5–36.0)
Platelets: 194 10*3/uL (ref 145–400)
Retic %: 1.34 % (ref 0.70–2.10)
Retic Ct Abs: 59.36 10*3/uL (ref 33.70–90.70)
WBC: 14.2 10*3/uL — ABNORMAL HIGH (ref 3.9–10.3)

## 2013-07-08 NOTE — Telephone Encounter (Signed)
Message copied by Sabino Snipes on Mon Jul 08, 2013  3:13 PM ------      Message from: Levert Feinstein      Created: Mon Jul 08, 2013  2:14 PM       Call pt: white count slowly rising but red count & platelets better than last time & both normal - continue to monitor.  She has repeat lab & MD visit in Feb ------

## 2013-07-08 NOTE — Telephone Encounter (Signed)
Called pt & informed of CBC results per Dr Patsy Lager request & will cont to monitor.  She does know about lab & MD visit in Feb.

## 2013-09-02 ENCOUNTER — Other Ambulatory Visit (HOSPITAL_BASED_OUTPATIENT_CLINIC_OR_DEPARTMENT_OTHER): Payer: Medicare Other

## 2013-09-02 ENCOUNTER — Ambulatory Visit (HOSPITAL_BASED_OUTPATIENT_CLINIC_OR_DEPARTMENT_OTHER): Payer: Medicare Other | Admitting: Oncology

## 2013-09-02 ENCOUNTER — Telehealth: Payer: Self-pay | Admitting: Oncology

## 2013-09-02 VITALS — BP 121/59 | HR 93 | Temp 97.9°F | Resp 20 | Ht 64.5 in | Wt 148.4 lb

## 2013-09-02 DIAGNOSIS — D0592 Unspecified type of carcinoma in situ of left breast: Secondary | ICD-10-CM

## 2013-09-02 DIAGNOSIS — C55 Malignant neoplasm of uterus, part unspecified: Secondary | ICD-10-CM

## 2013-09-02 DIAGNOSIS — D0590 Unspecified type of carcinoma in situ of unspecified breast: Secondary | ICD-10-CM

## 2013-09-02 DIAGNOSIS — C9110 Chronic lymphocytic leukemia of B-cell type not having achieved remission: Secondary | ICD-10-CM

## 2013-09-02 LAB — CBC & DIFF AND RETIC
BASO%: 1.2 % (ref 0.0–2.0)
Basophils Absolute: 0.2 10*3/uL — ABNORMAL HIGH (ref 0.0–0.1)
EOS%: 1.2 % (ref 0.0–7.0)
Eosinophils Absolute: 0.2 10*3/uL (ref 0.0–0.5)
HEMATOCRIT: 38.9 % (ref 34.8–46.6)
HGB: 12.9 g/dL (ref 11.6–15.9)
IMMATURE RETIC FRACT: 9.6 % (ref 1.60–10.00)
LYMPH%: 62.1 % — ABNORMAL HIGH (ref 14.0–49.7)
MCH: 29.7 pg (ref 25.1–34.0)
MCHC: 33.2 g/dL (ref 31.5–36.0)
MCV: 89.7 fL (ref 79.5–101.0)
MONO#: 0.8 10*3/uL (ref 0.1–0.9)
MONO%: 6 % (ref 0.0–14.0)
NEUT#: 3.9 10*3/uL (ref 1.5–6.5)
NEUT%: 29.5 % — ABNORMAL LOW (ref 38.4–76.8)
Platelets: 231 10*3/uL (ref 145–400)
RBC: 4.34 10*6/uL (ref 3.70–5.45)
RDW: 14.5 % (ref 11.2–14.5)
Retic %: 1.31 % (ref 0.70–2.10)
Retic Ct Abs: 56.85 10*3/uL (ref 33.70–90.70)
WBC: 13.1 10*3/uL — AB (ref 3.9–10.3)
lymph#: 8.1 10*3/uL — ABNORMAL HIGH (ref 0.9–3.3)

## 2013-09-02 LAB — COMPREHENSIVE METABOLIC PANEL (CC13)
ALK PHOS: 80 U/L (ref 40–150)
ALT: 14 U/L (ref 0–55)
AST: 29 U/L (ref 5–34)
Albumin: 3.5 g/dL (ref 3.5–5.0)
Anion Gap: 12 mEq/L — ABNORMAL HIGH (ref 3–11)
BILIRUBIN TOTAL: 0.56 mg/dL (ref 0.20–1.20)
BUN: 19.2 mg/dL (ref 7.0–26.0)
CO2: 29 mEq/L (ref 22–29)
Calcium: 10.1 mg/dL (ref 8.4–10.4)
Chloride: 99 mEq/L (ref 98–109)
Creatinine: 0.8 mg/dL (ref 0.6–1.1)
Glucose: 137 mg/dl (ref 70–140)
Potassium: 4 mEq/L (ref 3.5–5.1)
SODIUM: 140 meq/L (ref 136–145)
TOTAL PROTEIN: 7 g/dL (ref 6.4–8.3)

## 2013-09-02 LAB — LACTATE DEHYDROGENASE (CC13): LDH: 287 U/L — AB (ref 125–245)

## 2013-09-02 LAB — TECHNOLOGIST REVIEW

## 2013-09-02 NOTE — Telephone Encounter (Signed)
gv and printed appt sched and avs forpt for April and June °

## 2013-09-03 NOTE — Progress Notes (Signed)
Hematology and Oncology Follow Up Visit  Margaret Mann [id number] [birth year] 80 y.o. 09/03/2013 10:03 AM   Principle Diagnosis: Encounter Diagnoses  Name Primary?  . CLL (chronic lymphocytic leukemia) Yes  . DUCTAL CARCINOMA IN SITU, RIGHT BREAST   . Uterine cancer      Interim History:  Followup visit for this pleasant 80 year old woman with history of 3 primary malignancies: Breast, and endometrial cancers, as well as chronic lymphocytic leukemia. Please see my 08/01/2012 summary note for complete details.   Diagnosis of DCIS right breast December 2007 strongly ER/PR positive status post lumpectomy 07/12/2006. Postop radiation followed by tamoxifen. Right axillary lymph node enlargement found at that time with biopsy showing CLL versus WDLL  Endometrial cancer stage IA, grade 3, mixed endometrioid and clear cell , diagnosed January 2010 status post robotic hysterectomy with BSO 09/02/2008 followed by postoperative adjuvant chemotherapy with carboplatinum plus Taxol then brachytherapy to the vaginal vault between May 13 and 11/27/08. Followed by Dr. Jobe Gibbon. CLL initial stage I treated with observation until progression in July 13 with rising white count and fall in hemoglobin. 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 Treatment with 6 cycles of Bendamustine plus Rituxan between 05/03/2012 and 09/21/2012 with a complete response.  Routine mammogram done 02/28/2013 showed a new area of clustered calcifications in the lateral aspect of the left breast. A biopsy done on September 4 showed a complex changes with fibroadenoma and fibrocystic changes. Incisional biopsy was and a lumpectomy was done on September 30 showing identical findings to the needle biopsy with no invasive cancer but presence of lobular carcinoma in situ and a complex sclerosing lesion.  She has had no interim medical problems since her last visit here in October 2014. She denies any focal  bone pain, no change in bowel habit, no vaginal bleeding. No swollen glands. No infections.   Medications: reviewed  Allergies:  Allergies  Allergen Reactions  . Dilaudid [Hydromorphone Hcl] Nausea And Vomiting    Review of Systems: Hematology:  No bleeding or bruising ENT ROS: No sore throat Breast ROS: No new breast lumps Respiratory ROS: No cough or dyspnea Cardiovascular ROS:  No chest pain or palpitations Gastrointestinal ROS: See above no abdominal pain   Genito-Urinary ROS: See above Musculoskeletal ROS: See above Neurological ROS: No headache or change in vision Dermatological ROS: No rash or ecchymosis Remaining ROS negative:   Physical Exam: Blood pressure 121/59, pulse 93, temperature 97.9 F (36.6 C), temperature source Oral, resp. rate 20, height 5' 4.5" (1.638 m), weight 148 lb 6.4 oz (67.314 kg). Wt Readings from Last 3 Encounters:  09/02/13 148 lb 6.4 oz (67.314 kg)  05/08/13 147 lb 6.4 oz (66.86 kg)  05/06/13 151 lb (68.493 kg)     General appearance: Thin Caucasian woman HENNT: Pharynx no erythema, exudate, mass, or ulcer. No thyromegaly or thyroid nodules Lymph nodes: No cervical, supraclavicular, or axillary lymphadenopathy Breasts: Exam not done today Lungs: Clear to auscultation, resonant to percussion throughout Heart: Regular rhythm, no murmur, no gallop, no rub, no click, no edema Abdomen: Soft, nontender, normal bowel sounds, no mass, no organomegaly Extremities: No edema, no calf tenderness Musculoskeletal: no joint deformities GU:  Vascular: Carotid pulses 2+, no bruits, Neurologic: Alert, oriented, PERRLA  cranial nerves grossly normal, motor strength 5 over 5, reflexes 1+ symmetric, upper body coordination normal, gait normal, Skin: No rash or ecchymosis  Lab Results: CBC W/Diff    Component Value Date/Time   WBC 13.1* 09/02/2013 0930  WBC 69.3* 01/31/2012 0810   RBC 4.34 09/02/2013 0930   RBC 3.64* 01/31/2012 0810   HGB 12.9 09/02/2013  0930   HGB 12.0 04/09/2013 0914   HCT 38.9 09/02/2013 0930   HCT 34.3* 01/31/2012 0810   PLT 231 09/02/2013 0930   PLT 236 01/31/2012 0810   MCV 89.7 09/02/2013 0930   MCV 94.2 01/31/2012 0810   MCH 29.7 09/02/2013 0930   MCH 30.8 01/31/2012 0810   MCHC 33.2 09/02/2013 0930   MCHC 32.7 01/31/2012 0810   RDW 14.5 09/02/2013 0930   RDW 14.9 01/31/2012 0810   LYMPHSABS 8.1* 09/02/2013 0930   LYMPHSABS 56.8* 01/31/2012 0810   MONOABS 0.8 09/02/2013 0930   MONOABS 4.2* 01/31/2012 0810   EOSABS 0.2 09/02/2013 0930   EOSABS 0.0 01/31/2012 0810   BASOSABS 0.2* 09/02/2013 0930   BASOSABS 0.0 01/31/2012 0810     Chemistry      Component Value Date/Time   NA 140 09/02/2013 0930   NA 135 04/04/2013 1000   K 4.0 09/02/2013 0930   K 4.2 04/04/2013 1000   CL 98 04/04/2013 1000   CL 101 11/05/2012 0923   CO2 29 09/02/2013 0930   CO2 30 04/04/2013 1000   BUN 19.2 09/02/2013 0930   BUN 18 04/04/2013 1000   CREATININE 0.8 09/02/2013 0930   CREATININE 0.78 04/04/2013 1000      Component Value Date/Time   CALCIUM 10.1 09/02/2013 0930   CALCIUM 9.9 04/04/2013 1000   ALKPHOS 80 09/02/2013 0930   ALKPHOS 55 03/02/2012 0938   AST 29 09/02/2013 0930   AST 26 03/02/2012 0938   ALT 14 09/02/2013 0930   ALT 12 03/02/2012 0938   BILITOT 0.56 09/02/2013 0930   BILITOT 0.5 03/02/2012 0432        Impression:   #1. lobular carcinoma in situ, fibrocystic changes, sclerosing adenosis, and fibroadenoma, left breast diagnosed August 2014 status post lumpectomy.  #2. Previous diagnosis DCIS right breast in December 2007 and treated as outlined above.   #3. Stage IA, grade 2, endometrial carcinoma  Most recent followup with Dr. Clifton James on 04/25/2013 with no abnormalities on physical exam and CA 125 tumor marker 7.9. She will continue her followup with Dr. Clifton James. She will discuss having routine pelvic exams done here by her primary care physician Dr. Maurice Small. It is becoming more difficult for her to travel to [facility] at  her age.  #4. 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 Total white count stable compared with value from 2 months ago but trend for rise in lymphocyte percentage compared with previous values. . I will continue to monitor blood counts every 2 months with a clinical followup visit in 4 months. No indication to resume treatment at this time with normal hemoglobin and platelet count. We reviewed recent advances in the field. Ibrutinib  just received a first line indication for previously untreated patients with CLL with the chromosome 17 deletion and had a previous approval for patients with relapsed disease and would be a likely choice for treatment in the future for this lady when needed. We also have the availability of 2 new antibodies Arzerra and Gazyva as well as the PI3 kinase inhibitor Zydelig.  Total time spent with this patient in exam, review of data, and coordination of care, over 40 minutes.  I will transition her care to Dr. Bertis Ruddy   CC: Patient Care Team: Maurice Small,  MD as PCP - General Annia Belt, MD as Consulting Physician (Oncology) Hart Rochester as Referring Physician (Obstetrics and Gynecology) Edward Jolly, MD as Consulting Physician (General Surgery)   Annia Belt, MD 2/24/201510:03 AM

## 2013-09-07 ENCOUNTER — Encounter: Payer: Self-pay | Admitting: Oncology

## 2013-10-07 ENCOUNTER — Emergency Department (HOSPITAL_COMMUNITY): Payer: Medicare Other

## 2013-10-07 ENCOUNTER — Other Ambulatory Visit: Payer: Self-pay | Admitting: Hematology and Oncology

## 2013-10-07 ENCOUNTER — Encounter (HOSPITAL_COMMUNITY): Payer: Self-pay | Admitting: Emergency Medicine

## 2013-10-07 ENCOUNTER — Other Ambulatory Visit: Payer: Self-pay | Admitting: Oncology

## 2013-10-07 ENCOUNTER — Inpatient Hospital Stay (HOSPITAL_COMMUNITY)
Admission: EM | Admit: 2013-10-07 | Discharge: 2013-10-15 | DRG: 840 | Disposition: A | Discharge status: Home Health | Payer: Medicare Other | Attending: Family Medicine | Admitting: Family Medicine

## 2013-10-07 ENCOUNTER — Other Ambulatory Visit (HOSPITAL_COMMUNITY): Payer: Medicare Other

## 2013-10-07 DIAGNOSIS — Z8542 Personal history of malignant neoplasm of other parts of uterus: Secondary | ICD-10-CM

## 2013-10-07 DIAGNOSIS — E559 Vitamin D deficiency, unspecified: Secondary | ICD-10-CM

## 2013-10-07 DIAGNOSIS — R161 Splenomegaly, not elsewhere classified: Secondary | ICD-10-CM | POA: Diagnosis present

## 2013-10-07 DIAGNOSIS — N179 Acute kidney failure, unspecified: Secondary | ICD-10-CM | POA: Diagnosis present

## 2013-10-07 DIAGNOSIS — R4182 Altered mental status, unspecified: Secondary | ICD-10-CM | POA: Diagnosis present

## 2013-10-07 DIAGNOSIS — D696 Thrombocytopenia, unspecified: Secondary | ICD-10-CM | POA: Diagnosis present

## 2013-10-07 DIAGNOSIS — Z87891 Personal history of nicotine dependence: Secondary | ICD-10-CM

## 2013-10-07 DIAGNOSIS — R6510 Systemic inflammatory response syndrome (SIRS) of non-infectious origin without acute organ dysfunction: Secondary | ICD-10-CM | POA: Diagnosis present

## 2013-10-07 DIAGNOSIS — Z9010 Acquired absence of unspecified breast and nipple: Secondary | ICD-10-CM

## 2013-10-07 DIAGNOSIS — R599 Enlarged lymph nodes, unspecified: Secondary | ICD-10-CM | POA: Diagnosis present

## 2013-10-07 DIAGNOSIS — I619 Nontraumatic intracerebral hemorrhage, unspecified: Secondary | ICD-10-CM | POA: Diagnosis present

## 2013-10-07 DIAGNOSIS — R7989 Other specified abnormal findings of blood chemistry: Secondary | ICD-10-CM | POA: Diagnosis present

## 2013-10-07 DIAGNOSIS — Z79899 Other long term (current) drug therapy: Secondary | ICD-10-CM

## 2013-10-07 DIAGNOSIS — Z9079 Acquired absence of other genital organ(s): Secondary | ICD-10-CM

## 2013-10-07 DIAGNOSIS — Z7982 Long term (current) use of aspirin: Secondary | ICD-10-CM

## 2013-10-07 DIAGNOSIS — R531 Weakness: Secondary | ICD-10-CM

## 2013-10-07 DIAGNOSIS — D1802 Hemangioma of intracranial structures: Secondary | ICD-10-CM | POA: Diagnosis present

## 2013-10-07 DIAGNOSIS — E860 Dehydration: Secondary | ICD-10-CM | POA: Diagnosis present

## 2013-10-07 DIAGNOSIS — R040 Epistaxis: Secondary | ICD-10-CM | POA: Diagnosis not present

## 2013-10-07 DIAGNOSIS — D72829 Elevated white blood cell count, unspecified: Secondary | ICD-10-CM

## 2013-10-07 DIAGNOSIS — H9190 Unspecified hearing loss, unspecified ear: Secondary | ICD-10-CM | POA: Diagnosis present

## 2013-10-07 DIAGNOSIS — R7309 Other abnormal glucose: Secondary | ICD-10-CM | POA: Diagnosis not present

## 2013-10-07 DIAGNOSIS — C787 Secondary malignant neoplasm of liver and intrahepatic bile duct: Secondary | ICD-10-CM | POA: Diagnosis present

## 2013-10-07 DIAGNOSIS — Y92009 Unspecified place in unspecified non-institutional (private) residence as the place of occurrence of the external cause: Secondary | ICD-10-CM

## 2013-10-07 DIAGNOSIS — I5032 Chronic diastolic (congestive) heart failure: Secondary | ICD-10-CM | POA: Diagnosis present

## 2013-10-07 DIAGNOSIS — E785 Hyperlipidemia, unspecified: Secondary | ICD-10-CM | POA: Diagnosis present

## 2013-10-07 DIAGNOSIS — E440 Moderate protein-calorie malnutrition: Secondary | ICD-10-CM | POA: Diagnosis present

## 2013-10-07 DIAGNOSIS — I519 Heart disease, unspecified: Secondary | ICD-10-CM

## 2013-10-07 DIAGNOSIS — R7402 Elevation of levels of lactic acid dehydrogenase (LDH): Secondary | ICD-10-CM | POA: Diagnosis present

## 2013-10-07 DIAGNOSIS — R740 Nonspecific elevation of levels of transaminase and lactic acid dehydrogenase [LDH]: Secondary | ICD-10-CM

## 2013-10-07 DIAGNOSIS — M949 Disorder of cartilage, unspecified: Secondary | ICD-10-CM

## 2013-10-07 DIAGNOSIS — R5381 Other malaise: Secondary | ICD-10-CM | POA: Diagnosis present

## 2013-10-07 DIAGNOSIS — W19XXXA Unspecified fall, initial encounter: Secondary | ICD-10-CM | POA: Diagnosis present

## 2013-10-07 DIAGNOSIS — R5383 Other fatigue: Secondary | ICD-10-CM

## 2013-10-07 DIAGNOSIS — E883 Tumor lysis syndrome: Secondary | ICD-10-CM

## 2013-10-07 DIAGNOSIS — G9340 Encephalopathy, unspecified: Secondary | ICD-10-CM | POA: Diagnosis present

## 2013-10-07 DIAGNOSIS — M899 Disorder of bone, unspecified: Secondary | ICD-10-CM | POA: Diagnosis present

## 2013-10-07 DIAGNOSIS — R21 Rash and other nonspecific skin eruption: Secondary | ICD-10-CM | POA: Diagnosis present

## 2013-10-07 DIAGNOSIS — R000 Tachycardia, unspecified: Secondary | ICD-10-CM | POA: Diagnosis present

## 2013-10-07 DIAGNOSIS — C9110 Chronic lymphocytic leukemia of B-cell type not having achieved remission: Principal | ICD-10-CM | POA: Diagnosis present

## 2013-10-07 DIAGNOSIS — Z853 Personal history of malignant neoplasm of breast: Secondary | ICD-10-CM

## 2013-10-07 DIAGNOSIS — I10 Essential (primary) hypertension: Secondary | ICD-10-CM | POA: Diagnosis present

## 2013-10-07 DIAGNOSIS — Z90710 Acquired absence of both cervix and uterus: Secondary | ICD-10-CM

## 2013-10-07 DIAGNOSIS — R7401 Elevation of levels of liver transaminase levels: Secondary | ICD-10-CM | POA: Diagnosis present

## 2013-10-07 DIAGNOSIS — Z8541 Personal history of malignant neoplasm of cervix uteri: Secondary | ICD-10-CM

## 2013-10-07 DIAGNOSIS — C55 Malignant neoplasm of uterus, part unspecified: Secondary | ICD-10-CM | POA: Diagnosis present

## 2013-10-07 DIAGNOSIS — I509 Heart failure, unspecified: Secondary | ICD-10-CM | POA: Diagnosis present

## 2013-10-07 DIAGNOSIS — D0590 Unspecified type of carcinoma in situ of unspecified breast: Secondary | ICD-10-CM | POA: Diagnosis present

## 2013-10-07 DIAGNOSIS — Z6825 Body mass index (BMI) 25.0-25.9, adult: Secondary | ICD-10-CM

## 2013-10-07 DIAGNOSIS — R634 Abnormal weight loss: Secondary | ICD-10-CM

## 2013-10-07 DIAGNOSIS — I4510 Unspecified right bundle-branch block: Secondary | ICD-10-CM | POA: Diagnosis present

## 2013-10-07 LAB — COMPREHENSIVE METABOLIC PANEL
ALT: 25 U/L (ref 0–35)
AST: 77 U/L — ABNORMAL HIGH (ref 0–37)
Albumin: 3.5 g/dL (ref 3.5–5.2)
Alkaline Phosphatase: 197 U/L — ABNORMAL HIGH (ref 39–117)
BUN: 45 mg/dL — AB (ref 6–23)
CO2: 23 meq/L (ref 19–32)
CREATININE: 1.05 mg/dL (ref 0.50–1.10)
Calcium: 12.3 mg/dL — ABNORMAL HIGH (ref 8.4–10.5)
Chloride: 99 mEq/L (ref 96–112)
GFR calc Af Amer: 57 mL/min — ABNORMAL LOW (ref >90)
GFR, EST NON AFRICAN AMERICAN: 50 mL/min — AB (ref >90)
GLUCOSE: 122 mg/dL — AB (ref 70–99)
Potassium: 5.2 mEq/L (ref 3.7–5.3)
SODIUM: 140 meq/L (ref 137–147)
TOTAL PROTEIN: 7 g/dL (ref 6.0–8.3)
Total Bilirubin: 1.6 mg/dL — ABNORMAL HIGH (ref 0.3–1.2)

## 2013-10-07 LAB — URINALYSIS, ROUTINE W REFLEX MICROSCOPIC
Glucose, UA: NEGATIVE mg/dL
Hgb urine dipstick: NEGATIVE
Ketones, ur: NEGATIVE mg/dL
LEUKOCYTES UA: NEGATIVE
Nitrite: NEGATIVE
PH: 5.5 (ref 5.0–8.0)
Protein, ur: NEGATIVE mg/dL
SPECIFIC GRAVITY, URINE: 1.024 (ref 1.005–1.030)
Urobilinogen, UA: 0.2 mg/dL (ref 0.0–1.0)

## 2013-10-07 LAB — CBC WITH DIFFERENTIAL/PLATELET
BASOS ABS: 0 10*3/uL (ref 0.0–0.1)
BASOS PCT: 0 % (ref 0–1)
Band Neutrophils: 0 % (ref 0–10)
Blasts: 0 %
EOS ABS: 0 10*3/uL (ref 0.0–0.7)
EOS PCT: 0 % (ref 0–5)
HCT: 38.7 % (ref 36.0–46.0)
Hemoglobin: 12.6 g/dL (ref 12.0–15.0)
Lymphocytes Relative: 76 % — ABNORMAL HIGH (ref 12–46)
Lymphs Abs: 37.8 10*3/uL — ABNORMAL HIGH (ref 0.7–4.0)
MCH: 28.6 pg (ref 26.0–34.0)
MCHC: 32.6 g/dL (ref 30.0–36.0)
MCV: 87.8 fL (ref 78.0–100.0)
MONO ABS: 2 10*3/uL — AB (ref 0.1–1.0)
MONOS PCT: 4 % (ref 3–12)
Metamyelocytes Relative: 0 %
Myelocytes: 0 %
NEUTROS ABS: 10 10*3/uL — AB (ref 1.7–7.7)
NEUTROS PCT: 20 % — AB (ref 43–77)
NRBC: 1 /100{WBCs} — AB
Platelets: 174 10*3/uL (ref 150–400)
Promyelocytes Absolute: 0 %
RBC: 4.41 MIL/uL (ref 3.87–5.11)
RDW: 19.3 % — ABNORMAL HIGH (ref 11.5–15.5)
WBC: 49.8 10*3/uL — AB (ref 4.0–10.5)

## 2013-10-07 LAB — CBG MONITORING, ED: GLUCOSE-CAPILLARY: 124 mg/dL — AB (ref 70–99)

## 2013-10-07 LAB — LACTATE DEHYDROGENASE: LDH: 722 U/L — ABNORMAL HIGH (ref 94–250)

## 2013-10-07 MED ORDER — POTASSIUM CHLORIDE CRYS ER 20 MEQ PO TBCR
20.0000 meq | EXTENDED_RELEASE_TABLET | Freq: Every day | ORAL | Status: DC
  Administered 2013-10-07 – 2013-10-15 (×9): 20 meq via ORAL
  Filled 2013-10-07 (×9): qty 1

## 2013-10-07 MED ORDER — VITAMIN D 1000 UNITS PO TABS
2000.0000 [IU] | ORAL_TABLET | Freq: Every day | ORAL | Status: DC
  Administered 2013-10-07 – 2013-10-15 (×9): 2000 [IU] via ORAL
  Filled 2013-10-07 (×9): qty 2

## 2013-10-07 MED ORDER — ATORVASTATIN CALCIUM 40 MG PO TABS
40.0000 mg | ORAL_TABLET | Freq: Every day | ORAL | Status: DC
  Administered 2013-10-07 – 2013-10-11 (×5): 40 mg via ORAL
  Filled 2013-10-07 (×7): qty 1

## 2013-10-07 MED ORDER — SODIUM CHLORIDE 0.9 % IV SOLN
INTRAVENOUS | Status: DC
  Administered 2013-10-07 – 2013-10-11 (×10): via INTRAVENOUS

## 2013-10-07 MED ORDER — LORATADINE 10 MG PO TABS
10.0000 mg | ORAL_TABLET | Freq: Every day | ORAL | Status: DC
  Administered 2013-10-07 – 2013-10-15 (×9): 10 mg via ORAL
  Filled 2013-10-07 (×9): qty 1

## 2013-10-07 MED ORDER — ACETAMINOPHEN 650 MG RE SUPP: 650.0000 mg | Freq: Four times a day (QID) | RECTAL | Status: DC | PRN

## 2013-10-07 MED ORDER — SODIUM CHLORIDE 0.9 % IV BOLUS (SEPSIS)
1000.0000 mL | Freq: Once | INTRAVENOUS | Status: AC
  Administered 2013-10-07: 1000 mL via INTRAVENOUS

## 2013-10-07 MED ORDER — CALCIUM CARBONATE 1250 (500 CA) MG PO TABS
1.0000 | ORAL_TABLET | Freq: Two times a day (BID) | ORAL | Status: DC
  Administered 2013-10-07 – 2013-10-09 (×5): 500 mg via ORAL
  Filled 2013-10-07 (×6): qty 1

## 2013-10-07 MED ORDER — ENOXAPARIN SODIUM 40 MG/0.4ML ~~LOC~~ SOLN
40.0000 mg | SUBCUTANEOUS | Status: DC
  Administered 2013-10-07: 40 mg via SUBCUTANEOUS
  Filled 2013-10-07 (×2): qty 0.4

## 2013-10-07 MED ORDER — HYDROCHLOROTHIAZIDE 25 MG PO TABS
25.0000 mg | ORAL_TABLET | Freq: Every day | ORAL | Status: DC
  Filled 2013-10-07: qty 1

## 2013-10-07 MED ORDER — ETODOLAC 400 MG PO TABS
400.0000 mg | ORAL_TABLET | Freq: Two times a day (BID) | ORAL | Status: DC | PRN
  Filled 2013-10-07: qty 1

## 2013-10-07 MED ORDER — ONDANSETRON HCL 4 MG PO TABS: 4.0000 mg | ORAL_TABLET | Freq: Four times a day (QID) | ORAL | Status: DC | PRN

## 2013-10-07 MED ORDER — FENTANYL CITRATE 0.05 MG/ML IJ SOLN: 12.5000 ug | Freq: Four times a day (QID) | INTRAMUSCULAR | Status: DC | PRN

## 2013-10-07 MED ORDER — ACETAMINOPHEN 325 MG PO TABS
650.0000 mg | ORAL_TABLET | Freq: Four times a day (QID) | ORAL | Status: DC | PRN
  Administered 2013-10-07 – 2013-10-08 (×2): 650 mg via ORAL
  Filled 2013-10-07 (×2): qty 2

## 2013-10-07 MED ORDER — CALCIUM CARBONATE 600 MG PO TABS
600.0000 mg | ORAL_TABLET | Freq: Two times a day (BID) | ORAL | Status: DC
  Filled 2013-10-07 (×2): qty 1

## 2013-10-07 MED ORDER — TRAMADOL HCL 50 MG PO TABS: 50.0000 mg | ORAL_TABLET | Freq: Four times a day (QID) | ORAL | Status: DC | PRN

## 2013-10-07 MED ORDER — SODIUM CHLORIDE 0.9 % IJ SOLN
3.0000 mL | Freq: Two times a day (BID) | INTRAMUSCULAR | Status: DC
  Administered 2013-10-07 – 2013-10-11 (×4): 3 mL via INTRAVENOUS

## 2013-10-07 MED ORDER — ONDANSETRON HCL 4 MG/2ML IJ SOLN: 4.0000 mg | Freq: Four times a day (QID) | INTRAMUSCULAR | Status: DC | PRN

## 2013-10-07 MED ORDER — ADULT MULTIVITAMIN W/MINERALS CH
1.0000 | ORAL_TABLET | Freq: Every day | ORAL | Status: DC
  Administered 2013-10-07 – 2013-10-15 (×9): 1 via ORAL
  Filled 2013-10-07 (×9): qty 1

## 2013-10-07 MED ORDER — LISINOPRIL 5 MG PO TABS
5.0000 mg | ORAL_TABLET | Freq: Every day | ORAL | Status: DC
  Administered 2013-10-07 – 2013-10-15 (×9): 5 mg via ORAL
  Filled 2013-10-07 (×9): qty 1

## 2013-10-07 MED ORDER — SODIUM CHLORIDE 0.9 % IV SOLN: INTRAVENOUS | Status: AC

## 2013-10-07 MED ORDER — MULTIVITAMINS PO CAPS: 1.0000 | ORAL_CAPSULE | Freq: Every day | ORAL | Status: DC

## 2013-10-07 MED ORDER — ETODOLAC 200 MG PO CAPS
400.0000 mg | ORAL_CAPSULE | Freq: Two times a day (BID) | ORAL | Status: DC | PRN
  Filled 2013-10-07: qty 2

## 2013-10-07 MED ORDER — ASPIRIN EC 81 MG PO TBEC
81.0000 mg | DELAYED_RELEASE_TABLET | Freq: Every day | ORAL | Status: DC
Start: 2013-10-07 — End: 2013-10-15
  Administered 2013-10-07 – 2013-10-15 (×9): 81 mg via ORAL
  Filled 2013-10-07 (×11): qty 1

## 2013-10-07 NOTE — ED Notes (Signed)
Dry rash on lower extremities/knees-bruises on both knees-present prior to admission

## 2013-10-07 NOTE — ED Notes (Signed)
Patient came in with a fall.  No apparent injury, but patient reports feeling weak and tired recently.  Does not remember if she was dizzy or lightheaded before the fall.  Saw her  doctor March 12, Dr. Laurann Montana at [place] for back pain.  Sister in law at bedside reports patient acting confused and is usually not like this.

## 2013-10-07 NOTE — Consult Note (Signed)
[facility] NOTE  Patient Care Team: Kelton Pillar, MD as PCP - General Annia Belt, MD as Consulting Physician (Oncology) Hart Rochester as Referring Physician (Obstetrics and Gynecology) Edward Jolly, MD as Consulting Physician (General Surgery)  CHIEF COMPLAINTS/PURPOSE OF CONSULTATION:  Progressive leukocytosis, on background history of CLL, uterine cancer and breast cancer  HISTORY OF PRESENTING ILLNESS:  Margaret Mann 80 y.o. female is here because of recent fall. The patient is not a good historian. She had an accidental fall due to weakness. The patient denies syncopal episode or loss of consciousness. She was brought to the emergency Department for evaluation. Her blood tests show significant hypercalcemia with acute renal failure and progressive leukocytosis and hence I am consulted.  The patient has been treated by Dr. Beryle Beams for CLL.  I reviewed her records extensively as documented by Dr. Azucena Freed note dated February 23rd 2015. 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 She also had history of stage I uterine cancer that was treated surgically followed by postoperative chemotherapy and brachytherapy. 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 After her treatment was completed, I did not see a repeat imaging study done. Her blood cell count went back to normal however over the past 6 months, has started to rise.   The patient complained of anorexia and unspecified weight loss. She denies any new lymphadenopathy. Denies any night sweats. No recent infection. She denies any change in bowel habits.  MEDICAL HISTORY:  Past Medical History  Diagnosis Date  . Hypertension   . Hyperlipemia   . RBBB   . Lymphocytic leukemia   . Breast cancer     rt lump-08,lt lump 14  . Arthritis   .  Tuberculosis     as a teen-was treated  . CHF (congestive heart failure)     hx 04-cardiac work up 2010-no signs chf  . HOH (hard of hearing)   . Anemia   . Osteopenia     SURGICAL HISTORY: Past Surgical History  Procedure Laterality Date  . Biopsy breast  2008    rt  . Endometrial biopsy  2010  . Appendectomy    . Ganglion cyst excision  2011    left hand  . Diagnostic laparoscopy      node bx  . Abdominal hysterectomy  2010    BSO plus node bx  . Eye surgery      both cataracts  . Breast surgery  2008    rt lump-snbx  . Breast lumpectomy with needle localization Left 04/09/2013    Procedure: BREAST LUMPECTOMY WITH NEEDLE LOCALIZATION;  Surgeon: Edward Jolly, MD;  Location: [facility];  Service: General;  Laterality: Left;    SOCIAL HISTORY: History   Social History  . Marital Status: Widowed    Spouse Name: N/A    Number of Children: N/A  . Years of Education: N/A   Occupational History  . Not on file.   Social History Main Topics  . Smoking status: Former Smoker    Quit date: 04/02/1962  . Smokeless tobacco: Not on file     Comment: 1960s   . Alcohol Use: No  . Drug Use: No  . Sexual Activity: Not on file   Other Topics Concern  . Not on file   Social History Narrative  . No  narrative on file    FAMILY HISTORY: Family History  Problem Relation Age of Onset  . Cancer Mother     colon  . Parkinson's disease Mother   . Cancer Father     brain  . Cancer Sister     liver mets  . Hypothyroidism Sister     ALLERGIES:  is allergic to dilaudid.  MEDICATIONS:  Current Facility-Administered Medications  Medication Dose Route Frequency Provider Last Rate Last Dose  . 0.9 %  sodium chloride infusion   Intravenous STAT Margaret Mires, MD      . 0.9 %  sodium chloride infusion   Intravenous Continuous Margaret [name], MD      . acetaminophen (TYLENOL) tablet 650 mg  650 mg Oral Q6H PRN Margaret [name], MD       Or  .  acetaminophen (TYLENOL) suppository 650 mg  650 mg Rectal Q6H PRN Margaret [name], MD      . aspirin EC tablet 81 mg  81 mg Oral Daily Margaret [name], MD      . atorvastatin (LIPITOR) tablet 40 mg  40 mg Oral QHS Margaret [name], MD      . calcium carbonate (OS-CAL - dosed in mg of elemental calcium) tablet 500 mg of elemental calcium  1 tablet Oral BID WC Margaret [name], MD      . cholecalciferol (VITAMIN D) tablet 2,000 Units  2,000 Units Oral Daily Margaret [name], MD      . enoxaparin (LOVENOX) injection 40 mg  40 mg Subcutaneous Q24H Margaret [name], MD      . etodolac (LODINE) capsule 400 mg  400 mg Oral BID PRN Margaret [name], MD      . fentaNYL (SUBLIMAZE) injection 12.5 mcg  12.5 mcg Intravenous Q6H PRN Margaret [name], MD      . lisinopril (PRINIVIL,ZESTRIL) tablet 5 mg  5 mg Oral Daily Margaret [name], MD      . loratadine (CLARITIN) tablet 10 mg  10 mg Oral Daily Margaret [name], MD      . multivitamin with minerals tablet 1 tablet  1 tablet Oral Daily Margaret [name], MD      . ondansetron (ZOFRAN) tablet 4 mg  4 mg Oral Q6H PRN Margaret [name], MD       Or  . ondansetron (ZOFRAN) injection 4 mg  4 mg Intravenous Q6H PRN Margaret [name], MD      . potassium chloride SA (K-DUR,KLOR-CON) CR tablet 20 mEq  20 mEq Oral Daily Margaret [name], MD      . sodium chloride 0.9 % injection 3 mL  3 mL Intravenous Q12H Margaret [name], MD        REVIEW OF SYSTEMS:   Constitutional: Denies fevers, chills or abnormal night sweats Eyes: Denies blurriness of vision, double vision or watery eyes Ears, nose, mouth, throat, and face: Denies mucositis or sore throat Respiratory: Denies cough, dyspnea or wheezes Cardiovascular: Denies palpitation, chest discomfort or lower extremity swelling Gastrointestinal:  Denies nausea, heartburn or change in bowel habits Skin: Denies abnormal skin rashes Lymphatics: Denies new lymphadenopathy or easy bruising Neurological:Denies numbness, tingling. She did  have new weaknesses Behavioral/Psych: Mood is stable, no new changes  All other systems were reviewed with the patient and are negative.  PHYSICAL EXAMINATION: ECOG PERFORMANCE STATUS: 2 - Symptomatic, <50% confined to bed  Filed Vitals:   10/07/13 1556  BP: 149/76  Pulse: 93  Temp: 97.7 F (36.5 C)  Resp: 18   Filed Weights   10/07/13 1556  Weight: 144 lb 10 oz (65.6 kg)    GENERAL:alert, no distress and comfortable. She looks thin but not cachectic  SKIN: skin color, texture, turgor are normal, no rashes or significant lesions EYES: normal, conjunctiva are pink and non-injected, sclera clear OROPHARYNX:no exudate, no erythema and lips, buccal mucosa, and tongue normal  NECK: supple, thyroid normal size, non-tender, without nodularity LYMPH:  no palpable lymphadenopathy in the cervical, axillary or inguinal LUNGS: clear to auscultation and percussion with normal breathing effort HEART: regular rate & rhythm and no murmurs and no lower extremity edema ABDOMEN:abdomen soft, non-tender and normal bowel sounds Musculoskeletal:no cyanosis of digits and no clubbing  PSYCH: alert & oriented x 3 with fluent speech NEURO: no focal motor/sensory deficits  LABORATORY DATA:  I have reviewed the data as listed Lab Results  Component Value Date   WBC 49.8* 10/07/2013   HGB 12.6 10/07/2013   HCT 38.7 10/07/2013   MCV 87.8 10/07/2013   PLT 174 10/07/2013   Lab Results  Component Value Date   NA 140 10/07/2013   K 5.2 10/07/2013   CL 99 10/07/2013   CO2 23 10/07/2013    RADIOGRAPHIC STUDIES: I have personally reviewed the radiological images as listed and agreed with the findings in the report. Dg Chest 2 View  10/07/2013   CLINICAL DATA:  Chest pain, weakness and history of CHF.  EXAM: CHEST - 2 VIEW  COMPARISON:  CT CHEST W/CM dated 07/05/2012; DG CHEST 2 VIEW dated 05/04/2006  FINDINGS: Stable mild chronic lung disease. There is no evidence of pulmonary edema, consolidation,  pneumothorax, nodule or pleural fluid. The heart size and mediastinal contours are within normal limits. The bony thorax is unremarkable.  IMPRESSION: No active disease.   Electronically Signed   By: Aletta Edouard M.D.   On: 10/07/2013 15:17   Ct Head Wo Contrast  10/07/2013   CLINICAL DATA:  Confusion, history of bilateral breast cancer  EXAM: CT HEAD WITHOUT CONTRAST  TECHNIQUE: Contiguous axial images were obtained from the base of the skull through the vertex without intravenous contrast.  COMPARISON:  None.  FINDINGS: Calvarium intact. No hemorrhage or extra-axial fluid. No evidence of vascular territory infarct or mass effect. No hydrocephalus. Mild age-related atrophy.  IMPRESSION: No acute findings   Electronically Signed   By: Skipper Cliche M.D.   On: 10/07/2013 13:39    ASSESSMENT & PLAN: This is a pleasant 80 year old lady with high risk of CLL, treated a year ago, now found to have new onset weakness, anorexia, weight loss, and progressive leukocytosis with probable malignant hypercalcemia #1 CLL It is unclear to me whether the acute jump in her total whit blood cell count could be related to a component of stress response or progression of her disease. It is reasonable to assume that this is mainly related to disease progression due to the fact that she has 17p deletion. I will repeat her blood count tomorrow and will order peripheral smear. If I confirmed that indeed this is due to disease progression, she may need staging scans and repeat bone marrow biopsy. #2 hypercalcemia I will order PTH level. I suspect this is malignant in nature. I recommend aggressive hydration #3 weakness and unspecified weight loss I recommend nutritionists to see while she is hospitalized #4 acute renal failure I recommend aggressive fluid resuscitation #5 elevated AST and total bilirubin It is not clear whether this is abnormal liver function test. The AST could be due to recent fall. The elevated  bilirubin is of unclear etiology. I will order her workup to rule out hemolysis. #6 recent fall Recommend physical therapy to assess her strength while hospitalized. #7 history of DCIS Recent mammography and biopsy confirmed no evidence of disease recurrence #8 history of uterine cancer Clinically, she has no evidence of disease recurrence.  I will return tomorrow once I have more test results available. All questions were answered.     [facility], [place], MD 10/07/2013 4:35 PM

## 2013-10-07 NOTE — ED Provider Notes (Signed)
CSN: [id number]     Arrival date & time 10/07/13  1132 History   First MD Initiated Contact with Patient 10/07/13 1204     Chief Complaint  Patient presents with  . Fall  . Altered Mental Status     (Consider location/radiation/quality/duration/timing/severity/associated sxs/prior Treatment) Patient is a 80 y.o. female presenting with fall and altered mental status. The history is provided by the patient and a relative.  Fall Pertinent negatives include no chest pain, no abdominal pain, no headaches and no shortness of breath.  Altered Mental Status Presenting symptoms: no confusion   Associated symptoms: no abdominal pain, no fever, no headaches, no rash and no vomiting   pt with hx htn, breast ca, CLL, endomet ca, w gen weakness for the past 1-2 days. Pt lives alone, was trying to sit in chair last evening, missed chair, fell on ground. Denies injury or loc. Was able to get self up, was able to get self to bed last evening. This morning family picked up pt for outpt echo, and felt she appeared gen weak, shaky, w slow/altered speech. Pt denies change in speech or vision. States generally had felt well for past couple days. Eating less than normal. Compliant w normal meds. Only change in meds was recently starting on etolodac and cyclobenzeprine for back pain. Pt denies headache. No neck or back pain. No numbness/weakness. No chest pain or sob. No cough or uri c/o. No abd pain or nvd.  No dysuria.  No fever or chills.      Past Medical History  Diagnosis Date  . Hypertension   . Hyperlipemia   . RBBB   . Lymphocytic leukemia   . Breast cancer     rt lump-08,lt lump 14  . Arthritis   . Tuberculosis     as a teen-was treated  . CHF (congestive heart failure)     hx 04-cardiac work up 2010-no signs chf  . HOH (hard of hearing)   . Anemia   . Osteopenia    Past Surgical History  Procedure Laterality Date  . Biopsy breast  2008    rt  . Endometrial biopsy  2010  . Appendectomy     . Ganglion cyst excision  2011    left hand  . Diagnostic laparoscopy      node bx  . Abdominal hysterectomy  2010    BSO plus node bx  . Eye surgery      both cataracts  . Breast surgery  2008    rt lump-snbx  . Breast lumpectomy with needle localization Left 04/09/2013    Procedure: BREAST LUMPECTOMY WITH NEEDLE LOCALIZATION;  Surgeon: Edward Jolly, MD;  Location: [facility];  Service: General;  Laterality: Left;   Family History  Problem Relation Age of Onset  . Cancer Mother     colon  . Parkinson's disease Mother   . Cancer Father     brain  . Cancer Sister     liver mets  . Hypothyroidism Sister    History  Substance Use Topics  . Smoking status: Former Smoker    Quit date: 04/02/1962  . Smokeless tobacco: Not on file     Comment: 1960s   . Alcohol Use: No   OB History   Grav Para Term Preterm Abortions TAB SAB Ect Mult Living                 Review of Systems  Constitutional: Negative for fever and chills.  HENT: Negative for sore throat.   Eyes: Negative for visual disturbance.  Respiratory: Negative for cough and shortness of breath.   Cardiovascular: Negative for chest pain and leg swelling.  Gastrointestinal: Negative for vomiting, abdominal pain and diarrhea.  Genitourinary: Negative for flank pain.  Musculoskeletal: Negative for back pain and neck pain.  Skin: Negative for rash.  Neurological: Negative for numbness and headaches.  Hematological: Does not bruise/bleed easily.  Psychiatric/Behavioral: Negative for confusion.      Allergies  Dilaudid  Home Medications   Current Outpatient Rx  Name  Route  Sig  Dispense  Refill  . aspirin 81 MG tablet   Oral   Take 81 mg by mouth daily.         Marland Kitchen atorvastatin (LIPITOR) 40 MG tablet               . calcium carbonate (OS-CAL) 600 MG TABS   Oral   Take 600 mg by mouth 2 (two) times daily with a meal. Pt takes 900mg  in am and 600mg  in evening         .  cetirizine (ZYRTEC) 10 MG tablet   Oral   Take 10 mg by mouth daily.         . Cholecalciferol (VITAMIN D3) 2000 UNITS TABS   Oral   Take 1 tablet by mouth daily.         . hydrochlorothiazide (HYDRODIURIL) 25 MG tablet   Oral   Take 25 mg by mouth daily.         Marland Kitchen lisinopril (PRINIVIL,ZESTRIL) 5 MG tablet   Oral   Take 5 mg by mouth daily.         . Multiple Vitamin (MULTIVITAMIN) capsule   Oral   Take 1 capsule by mouth daily.         . potassium chloride SA (K-DUR,KLOR-CON) 20 MEQ tablet   Oral   Take 20 mEq by mouth daily.         . traMADol (ULTRAM) 50 MG tablet   Oral   Take 1-2 tablets (50-100 mg total) by mouth every 6 (six) hours as needed for pain.   30 tablet   1    BP 113/73  Pulse 120  Temp(Src) 97.9 F (36.6 C) (Oral)  Resp 18  SpO2 94% Physical Exam  Nursing note and vitals reviewed. Constitutional: She is oriented to person, place, and time. She appears well-developed and well-nourished. No distress.  HENT:  Head: Atraumatic.  Mouth/Throat: Oropharynx is clear and moist.  Eyes: Conjunctivae are normal. Pupils are equal, round, and reactive to light. No scleral icterus.  Neck: Neck supple. No tracheal deviation present. No thyromegaly present.  No bruit  Cardiovascular: Normal rate, regular rhythm, normal heart sounds and intact distal pulses.  Exam reveals no gallop and no friction rub.   No murmur heard. Pulmonary/Chest: Effort normal and breath sounds normal. No respiratory distress.  Abdominal: Soft. Normal appearance and bowel sounds are normal. She exhibits no distension and no mass. There is no tenderness. There is no rebound and no guarding.  Genitourinary:  No cva tenderness  Musculoskeletal: She exhibits no edema.  CTLS spine, non tender, aligned, no step off. Good rom bil ext without pain or focal bony tenderness  Neurological: She is alert and oriented to person, place, and time. No cranial nerve deficit.  Motor intact  bil, 5/5. No pronator drift. sens intact.   Skin: Skin is warm and dry. No rash noted. She is  not diaphoretic.  Psychiatric: She has a normal mood and affect.    ED Course  Procedures (including critical care time)  Results for orders placed during the hospital encounter of 10/07/13  CBC WITH DIFFERENTIAL      Result Value Ref Range   WBC 49.8 (*) 4.0 - 10.5 K/uL   RBC 4.41  3.87 - 5.11 MIL/uL   Hemoglobin 12.6  12.0 - 15.0 g/dL   HCT 38.7  36.0 - 46.0 %   MCV 87.8  78.0 - 100.0 fL   MCH 28.6  26.0 - 34.0 pg   MCHC 32.6  30.0 - 36.0 g/dL   RDW 19.3 (*) 11.5 - 15.5 %   Platelets 174  150 - 400 K/uL   Neutrophils Relative % 20 (*) 43 - 77 %   Lymphocytes Relative 76 (*) 12 - 46 %   Monocytes Relative 4  3 - 12 %   Eosinophils Relative 0  0 - 5 %   Basophils Relative 0  0 - 1 %   Band Neutrophils 0  0 - 10 %   Metamyelocytes Relative 0     Myelocytes 0     Promyelocytes Absolute 0     Blasts 0     nRBC 1 (*) 0 /100 WBC   Neutro Abs 10.0 (*) 1.7 - 7.7 K/uL   Lymphs Abs 37.8 (*) 0.7 - 4.0 K/uL   Monocytes Absolute 2.0 (*) 0.1 - 1.0 K/uL   Eosinophils Absolute 0.0  0.0 - 0.7 K/uL   Basophils Absolute 0.0  0.0 - 0.1 K/uL   RBC Morphology RARE NRBCs     WBC Morphology ATYPICAL LYMPHOCYTES    COMPREHENSIVE METABOLIC PANEL      Result Value Ref Range   Sodium 140  137 - 147 mEq/L   Potassium 5.2  3.7 - 5.3 mEq/L   Chloride 99  96 - 112 mEq/L   CO2 23  19 - 32 mEq/L   Glucose, Bld 122 (*) 70 - 99 mg/dL   BUN 45 (*) 6 - 23 mg/dL   Creatinine, Ser 1.05  0.50 - 1.10 mg/dL   Calcium 12.3 (*) 8.4 - 10.5 mg/dL   Total Protein 7.0  6.0 - 8.3 g/dL   Albumin 3.5  3.5 - 5.2 g/dL   AST 77 (*) 0 - 37 U/L   ALT 25  0 - 35 U/L   Alkaline Phosphatase 197 (*) 39 - 117 U/L   Total Bilirubin 1.6 (*) 0.3 - 1.2 mg/dL   GFR calc non Af Amer 50 (*) >90 mL/min   GFR calc Af Amer 57 (*) >90 mL/min  URINALYSIS, ROUTINE W REFLEX MICROSCOPIC      Result Value Ref Range   Color, Urine AMBER (*)  YELLOW   APPearance CLOUDY (*) CLEAR   Specific Gravity, Urine 1.024  1.005 - 1.030   pH 5.5  5.0 - 8.0   Glucose, UA NEGATIVE  NEGATIVE mg/dL   Hgb urine dipstick NEGATIVE  NEGATIVE   Bilirubin Urine LARGE (*) NEGATIVE   Ketones, ur NEGATIVE  NEGATIVE mg/dL   Protein, ur NEGATIVE  NEGATIVE mg/dL   Urobilinogen, UA 0.2  0.0 - 1.0 mg/dL   Nitrite NEGATIVE  NEGATIVE   Leukocytes, UA NEGATIVE  NEGATIVE  CBG MONITORING, ED      Result Value Ref Range   Glucose-Capillary 124 (*) 70 - 99 mg/dL   Ct Head Wo Contrast  10/07/2013   CLINICAL DATA:  Confusion, history of  bilateral breast cancer  EXAM: CT HEAD WITHOUT CONTRAST  TECHNIQUE: Contiguous axial images were obtained from the base of the skull through the vertex without intravenous contrast.  COMPARISON:  None.  FINDINGS: Calvarium intact. No hemorrhage or extra-axial fluid. No evidence of vascular territory infarct or mass effect. No hydrocephalus. Mild age-related atrophy.  IMPRESSION: No acute findings   Electronically Signed   By: Skipper Cliche M.D.   On: 10/07/2013 13:39       EKG Interpretation   Date/Time:  Monday October 07 2013 12:32:37 EDT Ventricular Rate:  98 PR Interval:  142 QRS Duration: 115 QT Interval:  368 QTC Calculation: 470 R Axis:   79 Text Interpretation:  Sinus rhythm Incomplete right bundle branch block No  significant change since last tracing Confirmed by Terresa Marlett  MD, Lennette Bihari  ([id number]) on 10/07/2013 12:41:08 PM      MDM  Iv ns. Labs. Ct.  Reviewed nursing notes and prior charts for additional history.   Iv ns bolus.  Calcium high, additional ivf.   Recheck feels mildly improved.    Given hypercalcemia, elevated bun, feel likely volume depletion/delydration.  Pt w hx CLL, wbc inc above baseline.  Will consult hem/onc.  Med service to admit.   Discussed w Dr Jana Hakim - he indicates they will consult, requests medical admit.  hospitalist requests tele bed, team 8.     Mirna Mires,  MD 10/07/13 [phone]

## 2013-10-07 NOTE — Progress Notes (Unsigned)
COURTESY NOTE:  80  Y/o [place] woman with a history of non-invasive breast cancer and stage IA endometrial carcinoma, also chronic lymphoid leukemia (CLL), formerly followed by Dr Beryle Beams, now assigned to our new Hematologist, Dr Alvy Bimler. She is being admitted with malaise, dehydration, and a rise in her neutrophils as well as her lymphocytes.  In the absence of fever would do a minimal ID workup (UA, UCX, CXR) in this immunocompromised patient, rehydrate and reassess. She will be seen by Dr Alvy Bimler within 24 hours of admission.  Summary of prior care: #1. lobular carcinoma in situ, fibrocystic changes, sclerosing adenosis, and fibroadenoma, left breast diagnosed August 2014 status post lumpectomy.   #2. Previous diagnosis DCIS right breast in December 2007 and treated as outlined above.   #3. Stage IA, grade 2, endometrial carcinoma --Most recent followup with Dr. Polly Cobia on 04/25/2013 with no abnormalities on physical exam and CA 125 tumor marker 7.9. She will continue her followup with Dr. Polly Cobia. She will discuss having routine pelvic exams done here by her primary care physician Dr. Kelton Pillar. It is becoming more difficult for her to travel to [facility] at her age.   #4. Chronic lymphocytic leukemia with poor prognostic chromosome changes: Deletion 17. Complete response to chemotherapy given through January of 2014.   Dr Darnell Level comments in his note: "We reviewed recent advances in the field. Ibrutinib just received a first line indication for previously untreated patients with CLL with the chromosome 17 deletion and had a previous approval for patients with relapsed disease and would be a likely choice for treatment in the future for this lady when needed. We also have the availability of 2 new antibodies Arzerra and Gazyva as well as the PI3 kinase inhibitor Zydelig."  Please let me know if I can be of further help

## 2013-10-07 NOTE — H&P (Addendum)
[facility] History and Physical  Margaret Mann YWV:[id number] DOB: Jun 08, 1933 DOA: 10/07/2013  Referring physician: Dr. Ashok Cordia PCP: Osborne Casco, MD   Chief Complaint:  AMS and weakness   HPI:  80 year old female with history of hypertension, 3 primary malignancies ( right breast ca, endometrial ca and CLL) currently not on any treatment, hypertension, hyperlipidemia, history of  diastolic dysfunction who was last known to be normal 5 days back was brought to the hospital by her sister-in-law she was found to be quite confused and dehydrated yesterday. When her sister-in-law visited patient last evening she will appear quite weak and dehydrated. She also had slowness of her speech. Patient reports feeling very weak for past 2-3 weeks with poor appetite. She was seen by her PCP about 18 days ago  for back pain and started on Flexeril and etodolac. Patient feels that her medications could have made her feel weaker. She denies any fever or chills, nausea, vomiting, headache, dizziness, chest pain, palpitations, shortness of breath, abdominal pain, dysuria, hematuria or diarrhea. Patient lives alone. She denies any sick contacts or recent travel. with past sister-in-law reported patient had a fall at home yesterday but patient is not able to recall the incidents clearly. She denies any head injury.   Review of Systems:  Constitutional: Denies fever, chills, diaphoresis, appetite change and fatigue.  HEENT: Denies photophobia, eye pain, redness, hearing loss, ear pain, congestion, sore throat, rhinorrhea, sneezing, mouth sores, trouble swallowing, neck pain, neck stiffness and tinnitus.   Respiratory: Denies SOB, DOE, cough, chest tightness,  and wheezing.   Cardiovascular: Denies chest pain, palpitations and leg swelling.  Gastrointestinal: Denies nausea, vomiting, abdominal pain, diarrhea, constipation, blood in stool and abdominal distention.  Genitourinary: Denies dysuria,  urgency, frequency, hematuria, flank pain and difficulty urinating.  Endocrine: Denies: hot or cold intolerance,polyuria, polydipsia. Musculoskeletal: Back pain, Denies myalgias,  joint swelling, arthralgias and gait problem.  Skin: Denies pallor, rash and wound.  Neurological: Weakness, lightheadedness, Denies dizziness, seizures, syncope,  l numbness and headaches.  Hematological: Denies adenopathy.  Psychiatric/Behavioral: Confusion present, Denies  nervousness, sleep disturbance and agitation   Past Medical History  Diagnosis Date  . Hypertension   . Hyperlipemia   . RBBB   . Lymphocytic leukemia   . Breast cancer     rt lump-08,lt lump 14  . Arthritis   . Tuberculosis     as a teen-was treated  . CHF (congestive heart failure)     hx 04-cardiac work up 2010-no signs chf  . HOH (hard of hearing)   . Anemia   . Osteopenia    Past Surgical History  Procedure Laterality Date  . Biopsy breast  2008    rt  . Endometrial biopsy  2010  . Appendectomy    . Ganglion cyst excision  2011    left hand  . Diagnostic laparoscopy      node bx  . Abdominal hysterectomy  2010    BSO plus node bx  . Eye surgery      both cataracts  . Breast surgery  2008    rt lump-snbx  . Breast lumpectomy with needle localization Left 04/09/2013    Procedure: BREAST LUMPECTOMY WITH NEEDLE LOCALIZATION;  Surgeon: Edward Jolly, MD;  Location: [place];  Service: General;  Laterality: Left;   Social History:  reports that she quit smoking about 51 years ago. She does not have any smokeless tobacco history on file. She reports that she does not drink  alcohol or use illicit drugs.  Allergies  Allergen Reactions  . Dilaudid [Hydromorphone Hcl] Nausea And Vomiting    Family History  Problem Relation Age of Onset  . Cancer Mother     colon  . Parkinson's disease Mother   . Cancer Father     brain  . Cancer Sister     liver mets  . Hypothyroidism Sister     Prior to  Admission medications   Medication Sig Start Date End Date Taking? Authorizing Provider  aspirin EC 81 MG tablet Take 81 mg by mouth daily.   Yes Historical Provider, MD  atorvastatin (LIPITOR) 40 MG tablet Take 40 mg by mouth at bedtime.  02/21/13  Yes Historical Provider, MD  calcium carbonate (OS-CAL) 600 MG TABS tablet Take 600 mg by mouth 2 (two) times daily with a meal.   Yes Historical Provider, MD  cetirizine (ZYRTEC) 10 MG tablet Take 10 mg by mouth daily.   Yes Historical Provider, MD  Cholecalciferol (VITAMIN D3) 2000 UNITS TABS Take 1 tablet by mouth daily. 06/12/12  Yes Historical Provider, MD  cyclobenzaprine (FLEXERIL) 10 MG tablet Take 10 mg by mouth at bedtime as needed for muscle spasms.   Yes Historical Provider, MD  etodolac (LODINE) 400 MG tablet Take 400 mg by mouth 2 (two) times daily as needed (as needed for back pain). With food   Yes Historical Provider, MD  hydrochlorothiazide (HYDRODIURIL) 25 MG tablet Take 25 mg by mouth daily.   Yes Historical Provider, MD  lisinopril (PRINIVIL,ZESTRIL) 5 MG tablet Take 5 mg by mouth daily.   Yes Historical Provider, MD  Multiple Vitamin (MULTIVITAMIN) capsule Take 1 capsule by mouth daily.   Yes Historical Provider, MD  potassium chloride SA (K-DUR,KLOR-CON) 20 MEQ tablet Take 20 mEq by mouth daily. 07/06/12  Yes Eston Esters, MD  traMADol (ULTRAM) 50 MG tablet Take 1-2 tablets (50-100 mg total) by mouth every 6 (six) hours as needed for pain. 04/09/13  Yes Edward Jolly, MD     Physical Exam:  Filed Vitals:   10/07/13 1138 10/07/13 1234 10/07/13 1342 10/07/13 1517  BP: 113/73 116/69 132/76 140/69  Pulse: 120 99 100 92  Temp: 97.9 F (36.6 C)     TempSrc: Oral     Resp: 18 26 14 22   SpO2: 94% 93% 93% 94%    Constitutional: Vital signs reviewed.  Patient is an elderly female lying in bed in no acute distress HEENT: no pallor, no icterus, dry oral mucosa, no cervical lymphadenopathy, no oral thrush Cardiovascular: RRR,  S1 normal, S2 normal, no MRG Chest: CTAB, no wheezes, rales, or rhonchi Abdominal: Soft. Non-tender, non-distended, bowel sounds are normal, no masses, organomegaly, or guarding present.  GU: no CVA tenderness Ext: warm, no edema, area of macular rash over bilateral knee ( rt>lt) Neurological: A&O x3, slow during conversation with occasional confusion, cranial nerves intact, no focal deficit, no neck rigidity.  Labs on Admission:  Basic Metabolic Panel:  Recent Labs Lab 10/07/13 1210  NA 140  K 5.2  CL 99  CO2 23  GLUCOSE 122*  BUN 45*  CREATININE 1.05  CALCIUM 12.3*   Liver Function Tests:  Recent Labs Lab 10/07/13 1210  AST 77*  ALT 25  ALKPHOS 197*  BILITOT 1.6*  PROT 7.0  ALBUMIN 3.5   No results found for this basename: LIPASE, AMYLASE,  in the last 168 hours No results found for this basename: AMMONIA,  in the last 168 hours CBC:  Recent Labs Lab 10/07/13 1210  WBC 49.8*  NEUTROABS 10.0*  HGB 12.6  HCT 38.7  MCV 87.8  PLT 174   Cardiac Enzymes: No results found for this basename: CKTOTAL, CKMB, CKMBINDEX, TROPONINI,  in the last 168 hours BNP: No components found with this basename: POCBNP,  CBG:  Recent Labs Lab 10/07/13 1203  GLUCAP 124*    Radiological Exams on Admission: Dg Chest 2 View  10/07/2013   CLINICAL DATA:  Chest pain, weakness and history of CHF.  EXAM: CHEST - 2 VIEW  COMPARISON:  CT CHEST W/CM dated 07/05/2012; DG CHEST 2 VIEW dated 05/04/2006  FINDINGS: Stable mild chronic lung disease. There is no evidence of pulmonary edema, consolidation, pneumothorax, nodule or pleural fluid. The heart size and mediastinal contours are within normal limits. The bony thorax is unremarkable.  IMPRESSION: No active disease.   Electronically Signed   By: Aletta Edouard M.D.   On: 10/07/2013 15:17   Ct Head Wo Contrast  10/07/2013   CLINICAL DATA:  Confusion, history of bilateral breast cancer  EXAM: CT HEAD WITHOUT CONTRAST  TECHNIQUE:  Contiguous axial images were obtained from the base of the skull through the vertex without intravenous contrast.  COMPARISON:  None.  FINDINGS: Calvarium intact. No hemorrhage or extra-axial fluid. No evidence of vascular territory infarct or mass effect. No hydrocephalus. Mild age-related atrophy.  IMPRESSION: No acute findings   Electronically Signed   By: Skipper Cliche M.D.   On: 10/07/2013 13:39    EKG: Normal sinus rhythm with incomplete RBBB, unchanged  Assessment/Plan  Principal Problem:   SIRS (systemic inflammatory response syndrome) Admit to telemetry  patient presents with poor appetite, weakness, fall and AMS.  Check blood culture. No clinical sign of infection. patient afebrile and not in distress. Patient does have slowness of speech and some confusion during conversation.  continue neuro checks q 4hrs.  I will discontinue her Flexeril and tramadol. Continue etodolic and add when necessary fentanyl for pain. -Check TSH, B12 and RPR. -Cannot explain significant leukocytosis. Has CLL but WBC last month was only 13,000. Has not been on steroid or received neulesta. Check blood culture. Does not have any central lines.  Patient has a superficial macular rash over the knees which she is unaware of. Needs clinical monitoring. -Patient has severe dehydration.  will hydrate her with normal saline at 125 cc per hour. Monitor urine output. -hold off on abx at this time.  -Check brain MRI to rule out for any encephalopathy or brain metastases. Check LDH - discussed plan with her oncologist ( Dr Alvy Bimler)   Active Problems:  Hypocalcemia Possibly secondary to dehydration. Corrected ca of  0.6. Check TSH. Monitor with hydration.   AKI Secondary to dehydration. Monitor with IV fluids  Multiple Malignancies Patient has 3 primary malignancies including breast, endometrial cancers and CLL. Not on any treatment at this time. Followed with Dr. Beryle Beams until recently and now will be  followed by Dr Alvy Bimler.  DCIS right breast since 2007 -strongly ER/PR positive status post lumpectomy 07/12/2006. Postop radiation followed by tamoxifen. Right axillary lymph node enlargement found at that time with biopsy showing CLL versus WDLL .  Endometrial cancer stage IA, grade 3, mixed endometrioid and clear cell ,  diagnosed January 2010 status post robotic hysterectomy with BSO 09/02/2008 followed by postoperative adjuvant chemotherapy with carboplatinum plus Taxol then brachytherapy to the vaginal vault between May 13 and 11/27/08.   CLL initial stage I ,  2013   17  chromosome abnormality on marrow biopsy. Treatment with 6 cycles of Bendamustine plus Rituxan between 05/03/2012 and 09/21/2012 with a complete response. Last WBC 1 month back of 13,000.      Essential hypertension, benign Continue lisinopril. Will hold HCTZ.      HYPERLIPIDEMIA continue statin  diastolic dysfn  appears dehydrated. Monitor with fluids         Diet:regular  DVT prophylaxis: sq lovenox   Code Status:full code Family Communication: discussed with sister in law  at bedside Disposition Plan: home once stable  Louretta Tantillo, [facility] Pager [phone]  Total time spent on admission :70 minutes  If 7PM-7AM, please contact night-coverage [email or web address] Password [facility] 10/07/2013, 3:56 PM

## 2013-10-07 NOTE — Progress Notes (Signed)
UR completed 

## 2013-10-07 NOTE — ED Notes (Signed)
Pt states lives alone; per family at bedside pt had appt this morning for echo--when they went to pick up patient; pt told them she had fallen last night and she didn't know why; pt doesn't remember what happened; per family states pt was complaining of back pain when they picked her up; per family pts speech and inability to express herself is new; pt states she has not felt good for a while; pt alert and oriented to person; birthday; events; states is March but unable to state date or year; pt unable to follow commands regarding arm drift--lays both arms back in lap; equal leg strength; equal smile--equal eyebrow movement;

## 2013-10-08 ENCOUNTER — Inpatient Hospital Stay (HOSPITAL_COMMUNITY): Payer: Medicare Other

## 2013-10-08 DIAGNOSIS — I10 Essential (primary) hypertension: Secondary | ICD-10-CM

## 2013-10-08 DIAGNOSIS — E440 Moderate protein-calorie malnutrition: Secondary | ICD-10-CM | POA: Insufficient documentation

## 2013-10-08 DIAGNOSIS — R630 Anorexia: Secondary | ICD-10-CM

## 2013-10-08 LAB — CBC WITH DIFFERENTIAL/PLATELET
Band Neutrophils: 0 % (ref 0–10)
Basophils Absolute: 0 10*3/uL (ref 0.0–0.1)
Basophils Relative: 0 % (ref 0–1)
Blasts: 0 %
Eosinophils Absolute: 0 10*3/uL (ref 0.0–0.7)
Eosinophils Relative: 0 % (ref 0–5)
HCT: 33.2 % — ABNORMAL LOW (ref 36.0–46.0)
Hemoglobin: 10.7 g/dL — ABNORMAL LOW (ref 12.0–15.0)
Lymphocytes Relative: 74 % — ABNORMAL HIGH (ref 12–46)
Lymphs Abs: 29.4 10*3/uL — ABNORMAL HIGH (ref 0.7–4.0)
MCH: 28.5 pg (ref 26.0–34.0)
MCHC: 32.2 g/dL (ref 30.0–36.0)
MCV: 88.3 fL (ref 78.0–100.0)
MYELOCYTES: 0 %
Metamyelocytes Relative: 0 %
Monocytes Absolute: 0.8 10*3/uL (ref 0.1–1.0)
Monocytes Relative: 2 % — ABNORMAL LOW (ref 3–12)
NEUTROS ABS: 9.5 10*3/uL — AB (ref 1.7–7.7)
NEUTROS PCT: 24 % — AB (ref 43–77)
NRBC: 1 /100{WBCs} — AB
PLATELETS: 138 10*3/uL — AB (ref 150–400)
PROMYELOCYTES ABS: 0 %
RBC: 3.76 MIL/uL — ABNORMAL LOW (ref 3.87–5.11)
RDW: 19.6 % — AB (ref 11.5–15.5)
WBC: 39.7 10*3/uL — ABNORMAL HIGH (ref 4.0–10.5)

## 2013-10-08 LAB — COMPREHENSIVE METABOLIC PANEL
ALT: 21 U/L (ref 0–35)
AST: 74 U/L — ABNORMAL HIGH (ref 0–37)
Albumin: 2.7 g/dL — ABNORMAL LOW (ref 3.5–5.2)
Alkaline Phosphatase: 184 U/L — ABNORMAL HIGH (ref 39–117)
BILIRUBIN TOTAL: 1.3 mg/dL — AB (ref 0.3–1.2)
BUN: 33 mg/dL — AB (ref 6–23)
CHLORIDE: 102 meq/L (ref 96–112)
CO2: 25 meq/L (ref 19–32)
Calcium: 10.5 mg/dL (ref 8.4–10.5)
Creatinine, Ser: 0.89 mg/dL (ref 0.50–1.10)
GFR, EST AFRICAN AMERICAN: 70 mL/min — AB (ref >90)
GFR, EST NON AFRICAN AMERICAN: 60 mL/min — AB (ref >90)
GLUCOSE: 83 mg/dL (ref 70–99)
POTASSIUM: 3.8 meq/L (ref 3.7–5.3)
Sodium: 139 mEq/L (ref 137–147)
TOTAL PROTEIN: 5.6 g/dL — AB (ref 6.0–8.3)

## 2013-10-08 LAB — DIRECT ANTIGLOBULIN TEST (NOT AT ARMC)
DAT, IgG: NEGATIVE
DAT, complement: NEGATIVE

## 2013-10-08 LAB — LACTATE DEHYDROGENASE: LDH: 677 U/L — AB (ref 94–250)

## 2013-10-08 LAB — VITAMIN B12

## 2013-10-08 LAB — RPR: RPR Ser Ql: NONREACTIVE

## 2013-10-08 LAB — TSH: TSH: 2.605 u[IU]/mL (ref 0.350–4.500)

## 2013-10-08 MED ORDER — GADOBENATE DIMEGLUMINE 529 MG/ML IV SOLN
13.0000 mL | Freq: Once | INTRAVENOUS | Status: AC | PRN
  Administered 2013-10-08: 13 mL via INTRAVENOUS

## 2013-10-08 MED ORDER — ENSURE COMPLETE PO LIQD
237.0000 mL | Freq: Two times a day (BID) | ORAL | Status: DC
  Administered 2013-10-08 – 2013-10-15 (×9): 237 mL via ORAL

## 2013-10-08 NOTE — Progress Notes (Addendum)
Patient decided to d/c home with home health services. RNCM notified in order to arranged home health needs.    Clinical Social Work Department CLINICAL SOCIAL WORK PLACEMENT NOTE 10/08/2013  Patient:  Margaret Mann, Margaret Mann  Account Number:  [id number] Admit date:  10/07/2013  Clinical Social Worker:  Renold Genta  Date/time:  10/08/2013 03:00 PM  Clinical Social Work is seeking post-discharge placement for this patient at the following level of care:   SKILLED NURSING   (*CSW will update this form in Epic as items are completed)   10/08/2013  Patient/family provided with [place] Department of Clinical Social Work's list of facilities offering this level of care within the geographic area requested by the patient (or if unable, by the patient's family).  10/08/2013  Patient/family informed of their freedom to choose among providers that offer the needed level of care, that participate in Medicare, Medicaid or managed care program needed by the patient, have an available bed and are willing to accept the patient.  10/08/2013  Patient/family informed of [facility]' ownership interest in [facility], as well as of the fact that they are under no obligation to receive care at this facility.  PASARR submitted to EDS on 10/08/2013 PASARR number received from EDS on 10/08/2013  FL2 transmitted to all facilities in geographic area requested by pt/family on  10/08/2013 FL2 transmitted to all facilities within larger geographic area on   Patient informed that his/her managed care company has contracts with or will negotiate with  certain facilities, including the following:     Patient/family informed of bed offers received:  10/08/2013 Patient chooses bed at [facility] Physician recommends and patient chooses bed at    Patient to be transferred to [place] on  10/15/2013 Patient to be transferred to facility by family via private vehicle  The following  physician request were entered in Epic:   Additional Comments:   Raynaldo Opitz, [place] Social Worker cell #: [phone]   Alison Murray, MSW, [place] [facility] [phone]

## 2013-10-08 NOTE — Progress Notes (Signed)
Clinical Social Work Department BRIEF PSYCHOSOCIAL ASSESSMENT 10/08/2013  Patient:  Margaret Mann, Margaret Mann     Account Number:  [id number]     Admit date:  10/07/2013  Clinical Social Worker:  Renold Genta  Date/Time:  10/08/2013 02:55 PM  Referred by:  Physician  Date Referred:  10/08/2013 Referred for  SNF Placement   Other Referral:   Interview type:  Patient Other interview type:    PSYCHOSOCIAL DATA Living Status:  ALONE Admitted from facility:   Level of care:   Primary support name:  Quinta Eimer (brother-in-law) ph#: [phone] Primary support relationship to patient:  FAMILY Degree of support available:   good    CURRENT CONCERNS Current Concerns  Post-Acute Placement   Other Concerns:    SOCIAL WORK ASSESSMENT / PLAN CSW reviewed PT evaluation recommending SNF at discharge.   Assessment/plan status:  Information/Referral to [employer] Other assessment/ plan:   Information/referral to community resources:   CSW completed FL2 and faxed information out to [facility] - provided bed offers to patient.    PATIENT'S/FAMILY'S RESPONSE TO PLAN OF CARE: Patient states that she lives alone but her brother-in-law and his wife live right next door. Patient has accepted bed offer @ [place] @ [facility] made aware. Anticipating possible discharge tomorrow. CSW will follow-up in the morning.       Raynaldo Opitz, [facility] Clinical Social Worker cell #: [phone]

## 2013-10-08 NOTE — Progress Notes (Signed)
INITIAL NUTRITION ASSESSMENT  DOCUMENTATION CODES Per approved criteria  -Non-severe (moderate) malnutrition in the context of chronic illness  Pt meets criteria for moderate MALNUTRITION in the context of chronic illness as evidenced by PO intake <75% for one month, moderate muscle wasting and subcutaneous fat loss.   INTERVENTION: -Recommend Ensure Complete po BID, each supplement provides 350 kcal and 13 grams of protein -Will place 10AM/2PM/8PM nourishments -Reviewed high kcal/protein snacks with pt and family -Consider addition of appetite stimulant -Will continue to monitor   NUTRITION DIAGNOSIS: Inadequate oral intake related to decreased appetite as evidenced by PO intake <75%, 5 lbs unintentional wt loss.   Goal: Pt to meet >/= 90% of their estimated nutrition needs    Monitor:  Total protein/energy intake, labs, weights  Reason for Assessment: Consult/MST  80 y.o. female  Admitting Dx: SIRS (systemic inflammatory response syndrome)  ASSESSMENT: 80 year old female with history of hypertension, 3 primary malignancies ( right breast ca, endometrial ca and CLL) currently not on any treatment, hypertension, hyperlipidemia, history of diastolic dysfunction who was last known to be normal 5 days back was brought to the hospital by her sister-in-law she was found to be quite confused and dehydrated yesterday  -Pt denied any nausea/abd pain, just noted minimal interest in eating as "foods don't taste right", and that she didn't know what she should be eating. -Endorsed a 5 lbs unintentional wt loss in one month, decreased intake for 2-3 weeks.  -Family noted that pt's microwave broke, which likely contributed to a decreased intake. They plan on fixing it before pt d/c'd. Family will also sometimes makes meals for pt to encourage appetite -Pt lives alone and eats small amounts per family. Pt unable to provide full diet recall as she was still slightly confused during assessment.  Enjoys foods such as yogurt, cheese and crackers, and peanut butter -Encouraged methods to increase taste/flavors -Has been consuming <50% of meals. Family at bedside trying to encourage pt to eat and has been assisting in meal order. -Pt willing to try Ensure and snacks between meals -May benefit from appetite stimulant if PO intake continues to be less than 50% -Moderate muscle wasting and subcutaneous fat loss in temporal/occipital, clavicle, upper arm and acromion regions -Elevated LFT, possible from fall -Elevated Ca likely r/t to malignancy  Height: Ht Readings from Last 1 Encounters:  10/07/13 5\' 4"  (1.626 m)    Weight: Wt Readings from Last 1 Encounters:  10/07/13 144 lb 10 oz (65.6 kg)    Ideal Body Weight: 120 lbs  % Ideal Body Weight: 117%  Wt Readings from Last 10 Encounters:  10/07/13 144 lb 10 oz (65.6 kg)  09/02/13 148 lb 6.4 oz (67.314 kg)  05/08/13 147 lb 6.4 oz (66.86 kg)  05/06/13 151 lb (68.493 kg)  04/09/13 149 lb (67.586 kg)  04/09/13 149 lb (67.586 kg)  03/21/13 147 lb (66.679 kg)  11/05/12 147 lb 14.4 oz (67.087 kg)  09/10/12 148 lb 3.2 oz (67.223 kg)  08/01/12 144 lb (65.318 kg)    Usual Body Weight: 149 lbs  % Usual Body Weight: 97%  BMI:  Body mass index is 24.81 kg/(m^2).  Estimated Nutritional Needs: Kcal: 1900-2100 Protein: 75-85 Fluid: >/=1900 ml/daily  Skin: WDL  Diet Order: General  EDUCATION NEEDS: -Education needs addressed   Intake/Output Summary (Last 24 hours) at 10/08/13 1049 Last data filed at 10/08/13 0713  Gross per 24 hour  Intake 2498.33 ml  Output   1611 ml  Net 887.33 ml  Last BM: 3/29   Labs:   Recent Labs Lab 10/07/13 1210 10/08/13 0350  NA 140 139  K 5.2 3.8  CL 99 102  CO2 23 25  BUN 45* 33*  CREATININE 1.05 0.89  CALCIUM 12.3* 10.5  GLUCOSE 122* 83    CBG (last 3)   Recent Labs  10/07/13 1203  GLUCAP 124*    Scheduled Meds: . sodium chloride   Intravenous STAT  . aspirin EC   81 mg Oral Daily  . atorvastatin  40 mg Oral QHS  . calcium carbonate  1 tablet Oral BID WC  . cholecalciferol  2,000 Units Oral Daily  . enoxaparin (LOVENOX) injection  40 mg Subcutaneous Q24H  . feeding supplement (ENSURE COMPLETE)  237 mL Oral BID BM  . lisinopril  5 mg Oral Daily  . loratadine  10 mg Oral Daily  . multivitamin with minerals  1 tablet Oral Daily  . potassium chloride SA  20 mEq Oral Daily  . sodium chloride  3 mL Intravenous Q12H    Continuous Infusions: . sodium chloride 125 mL/hr at 10/08/13 1000    Past Medical History  Diagnosis Date  . Hypertension   . Hyperlipemia   . RBBB   . Lymphocytic leukemia   . Breast cancer     rt lump-08,lt lump 14  . Arthritis   . Tuberculosis     as a teen-was treated  . CHF (congestive heart failure)     hx 04-cardiac work up 2010-no signs chf  . HOH (hard of hearing)   . Anemia   . Osteopenia     Past Surgical History  Procedure Laterality Date  . Biopsy breast  2008    rt  . Endometrial biopsy  2010  . Appendectomy    . Ganglion cyst excision  2011    left hand  . Diagnostic laparoscopy      node bx  . Abdominal hysterectomy  2010    BSO plus node bx  . Eye surgery      both cataracts  . Breast surgery  2008    rt lump-snbx  . Breast lumpectomy with needle localization Left 04/09/2013    Procedure: BREAST LUMPECTOMY WITH NEEDLE LOCALIZATION;  Surgeon: Edward Jolly, MD;  Location: [place];  Service: General;  Laterality: Left;    Atlee Abide MS RD LDN Clinical Dietitian [phone]

## 2013-10-08 NOTE — Progress Notes (Signed)
[place]  [phone]    HOSPITAL PROGRESS NOTE. I have seen the patient, examined her and agree with the assessment and plans as outlined below.  Events since 3/30 noted. She went for MRI brain this morning. She is still weak, with poor oral intake.  Otherwise, no events overnight.   MEDICATIONS:  Scheduled Meds: . sodium chloride   Intravenous STAT  . aspirin EC  81 mg Oral Daily  . atorvastatin  40 mg Oral QHS  . calcium carbonate  1 tablet Oral BID WC  . cholecalciferol  2,000 Units Oral Daily  . enoxaparin (LOVENOX) injection  40 mg Subcutaneous Q24H  . lisinopril  5 mg Oral Daily  . loratadine  10 mg Oral Daily  . multivitamin with minerals  1 tablet Oral Daily  . potassium chloride SA  20 mEq Oral Daily  . sodium chloride  3 mL Intravenous Q12H   Continuous Infusions: . sodium chloride 125 mL/hr at 10/08/13 0008   PRN Meds:.acetaminophen, acetaminophen, etodolac, fentaNYL, ondansetron (ZOFRAN) IV, ondansetron ALLERGIES:   Allergies  Allergen Reactions  . Dilaudid [Hydromorphone Hcl] Nausea And Vomiting     PHYSICAL EXAMINATION:   Filed Vitals:   10/08/13 0502  BP: 137/80  Pulse: 85  Temp: 98 F (36.7 C)  Resp:      Filed Weights   10/07/13 1556  Weight: 144 lb 10 oz (65.20 kg)    80 year old  in no acute distress,   GENERAL:alert, no distress and comfortable. She looks thin but not cachectic  SKIN: skin color, texture, turgor are normal, no rashes or significant lesions  EYES: normal, conjunctiva are pink and non-injected, sclera clear  OROPHARYNX:no exudate, no erythema and lips, buccal mucosa, and tongue normal  NECK: supple, thyroid normal size, non-tender, without nodularity  LYMPH: no palpable lymphadenopathy in the cervical, axillary or inguinal  LUNGS: clear to auscultation and percussion with normal breathing effort  HEART: regular rate & rhythm and no murmurs and no lower extremity edema  ABDOMEN:abdomen soft,  non-tender and normal bowel sounds  Musculoskeletal:no cyanosis of digits and no clubbing  PSYCH: alert,, with fluent speech  NEURO: no focal motor/sensory deficits except for impaired hearing  LABORATORY/RADIOLOGY DATA:   Recent Labs Lab 10/07/13 1210 10/08/13 0350  WBC 49.8* 39.7*  HGB 12.6 10.7*  HCT 38.7 33.2*  PLT 174 PENDING  MCV 87.8 88.3  MCH 28.6 28.5  MCHC 32.6 32.2  RDW 19.3* 19.6*  LYMPHSABS 37.8* 29.4*  MONOABS 2.0* 0.8  EOSABS 0.0 0.0  BASOSABS 0.0 0.0    CMP    Recent Labs Lab 10/07/13 1210 10/08/13 0350  NA 140 139  K 5.2 3.8  CL 99 102  CO2 23 25  GLUCOSE 122* 83  BUN 45* 33*  CREATININE 1.05 0.89  CALCIUM 12.3* 10.5  AST 77* 74*  ALT 25 21  ALKPHOS 197* 184*  BILITOT 1.6* 1.3*        Component Value Date/Time   BILITOT 1.3* 10/08/2013 0350   BILITOT 0.56 09/02/2013 0930    Anemia panel:    Recent Labs  10/07/13 1210  VITAMINB12 >2000*     Recent Labs  10/07/13 1210  TSH 2.605       Urinalysis    Component Value Date/Time   COLORURINE AMBER* 10/07/2013 1232   APPEARANCEUR CLOUDY* 10/07/2013 1232   LABSPEC 1.024 10/07/2013 1232   PHURINE 5.5 10/07/2013 1232   GLUCOSEU NEGATIVE 10/07/2013 1232   HGBUR NEGATIVE 10/07/2013 1232  BILIRUBINUR LARGE* 10/07/2013 1232   KETONESUR NEGATIVE 10/07/2013 1232   PROTEINUR NEGATIVE 10/07/2013 1232   UROBILINOGEN 0.2 10/07/2013 1232   NITRITE NEGATIVE 10/07/2013 1232   LEUKOCYTESUR NEGATIVE 10/07/2013 1232     Liver Function Tests:  Recent Labs Lab 10/07/13 1210 10/08/13 0350  AST 77* 74*  ALT 25 21  ALKPHOS 197* 184*  BILITOT 1.6* 1.3*  PROT 7.0 5.6*  ALBUMIN 3.5 2.7*    CBG:  Recent Labs Lab 10/07/13 1203  GLUCAP 124*   Thyroid function studies  Recent Labs  10/07/13 1210  TSH 2.605    Radiology Studies:  I reviewed the MRI and agree with the interpretation.  MRI showed focal 1 cm area in the left temporal lobe with significant susceptibility and central T1  and T2 hyperintensity compatible with acute/subacute hemorrhage. This may be in the setting of a more remote hemorrhage. The appearance favors that of a cavernous hemangioma width rebleed   80 year old lady with high risk of CLL, treated a year ago, now found to have new onset weakness, anorexia, weight loss, and progressive leukocytosis with probable malignant hypercalcemia   #1 CLL  The pattern of persistent leukocytosis this compatible with disease progression.  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  Repeat CBC today shows WBC of 39.7, improved from admission (49.8). She may need staging scans and repeat bone marrow biopsy.    #2 hypercalcemia  Rule out malignancy etiology. It has improved to 10.5 from 12.3 on admission. Continue  Hydration . PTH level is pending.   #3 weakness and unspecified weight loss  Nutrition evaluation pending.  #4 acute renal failure  Improving with aggressive fluid resuscitation   #5 elevated AST and total bilirubin  It is not clear whether this is abnormal liver function test. The AST could be due to recent fall. The elevated bilirubin is of unclear etiology, although improving (1.3 from 1.6 on admission). Hemolytic workup in process. Available data includes  LDH 677 (from 722 on admission). B12 >2000. DAT negative. If you remain persistently elevated, we might have to do CT scan to rule out malignancy.   #6 recent fall  Recommend physical therapy to assess her strength while hospitalized.   #7 history of DCIS  Recent mammography and biopsy confirmed no evidence of disease recurrence   #8 history of uterine cancer  Clinically, she has no evidence of disease recurrence.   #9 Abnormal MRI Continue observation. Likely related to recent fall.   Rondel Jumbo, PA-C 10/08/2013, 7:06 AM  Eunice Oldaker, MD 10/08/2013

## 2013-10-08 NOTE — Evaluation (Signed)
Physical Therapy Evaluation Patient Details Name: Margaret Mann MRN: [id number] DOB: 02/20/1933 Today's Date: 10/08/2013   History of Present Illness  80 year old female with history of hypertension, 3 primary malignancies ( right breast ca, endometrial ca and CLL) currently not on any treatment, hypertension, hyperlipidemia, history of  diastolic dysfunction who was last known to be normal 5 days back was brought to the hospital by her sister-in-law she was found to be quite confused and dehydrated yesterday. When her sister-in-law visited patient last evening she will appear quite weak and dehydrated. She also had slowness of her speech. Patient reports feeling very weak for past 2-3 weeks with poor appetite  Clinical Impression  Pt will benefit from PT to address deficits below; pt sisiter reports that pt is markedly different from her baseline both mentally and physically;    Follow Up Recommendations SNF;Supervision/Assistance - 24 hour    Equipment Recommendations       Recommendations for Other Services       Precautions / Restrictions Precautions Precautions: Fall      Mobility  Bed Mobility Overal bed mobility: Needs Assistance Bed Mobility: Supine to Sit     Supine to sit: Min assist     General bed mobility comments: incr time and cues for  self assist, delayed processing  Transfers Overall transfer level: Needs assistance Equipment used: None;1 person hand held assist;2 person hand held assist Transfers: Sit to/from Stand           General transfer comment: cues for  hand placement, wt shift, safety; requires incr time to and hand over hand facilitation at times  Ambulation/Gait Ambulation/Gait assistance: Min assist;Mod assist Ambulation Distance (Feet): 110 Feet (10' x 2) Assistive device: 1 person hand held assist;2 person hand held assist;None Gait Pattern/deviations: Decreased stride length;Trunk flexed;Drifts right/left;Leaning  posteriorly;Shuffle;Narrow base of support Gait velocity: decr   General Gait Details: bil knees flexed throughout gait cycle; cues to incr step length  Stairs            Wheelchair Mobility    Modified Rankin (Stroke Patients Only)       Balance Overall balance assessment: Needs assistance Sitting-balance support: No upper extremity supported;Feet supported Sitting balance-Leahy Scale: Fair Sitting balance - Comments: min to min-guard  Postural control: Posterior lean Standing balance support: During functional activity;Bilateral upper extremity supported;Single extremity supported Standing balance-Leahy Scale: Poor               High level balance activites: Backward walking;Direction changes;Turns High Level Balance Comments: LOB x 4-5 with min/mod  to recover, delayed reactions     Pertinent Vitals/Pain Denies pain    Home Living Family/patient expects to be discharged to:: Private residence Living Arrangements: Alone   Type of Home: House Home Access: Stairs to enter   Technical brewer of Steps: 2 Home Layout: One level Home Equipment: None      Prior Function Level of Independence: Independent               Hand Dominance        Extremity/Trunk Assessment   Upper Extremity Assessment:  (LUE weaker than RUE)           Lower Extremity Assessment: Generalized weakness         Communication   Communication: No difficulties  Cognition Arousal/Alertness: Awake/alert Behavior During Therapy: WFL for tasks assessed/performed Overall Cognitive Status: Impaired/Different from baseline Area of Impairment: Following commands;Safety/judgement;Awareness;Problem solving;Orientation Orientation Level: Place;Time;Situation (able to choose place and month with  multiple choice)     Following Commands: Follows one step commands with increased time Safety/Judgement: Decreased awareness of safety;Decreased awareness of deficits    Problem Solving: Slow processing;Difficulty sequencing;Requires verbal cues;Requires tactile cues General Comments: slow processing, word finding issues;     General Comments      Exercises        Assessment/Plan    PT Assessment Patient needs continued PT services  PT Diagnosis Difficulty walking   PT Problem List Decreased strength;Decreased range of motion;Decreased activity tolerance;Decreased balance;Decreased mobility;Decreased knowledge of use of DME;Decreased safety awareness  PT Treatment Interventions DME instruction;Gait training;Stair training;Functional mobility training;Therapeutic activities;Therapeutic exercise;Patient/family education;Balance training;Neuromuscular re-education   PT Goals (Current goals can be found in the Care Plan section) Acute Rehab PT Goals Patient Stated Goal: pt unable to state PT Goal Formulation: With patient Time For Goal Achievement: 10/22/13 Potential to Achieve Goals: Fair    Frequency Min 3X/week   Barriers to discharge        End of Session Equipment Utilized During Treatment: Gait belt Activity Tolerance: Patient tolerated treatment well Patient left: in chair;with call bell/phone within reach;with chair alarm set;with family/visitor present         Time: 1478-2956 PT Time Calculation (min): 23 min   Charges:   PT Evaluation $Initial PT Evaluation Tier I: 1 Procedure PT Treatments $Gait Training: 23-37 mins   PT G CodesKenyon Ana 10-17-2013, 12:35 PM

## 2013-10-08 NOTE — Progress Notes (Signed)
[facility] PROGRESS NOTE  Margaret Mann LPF:[id number] DOB: 1932-11-11 DOA: 10/07/2013 PCP: Osborne Casco, MD  Assessment/Plan: SIRS (systemic inflammatory response syndrome)  -WBC's remaisn elevated most likely due to CLL -tachycardia resolved after IVF's -no signs of infection -SIRS now resolved -will follow recommendations from oncology service  Acute encephalopathy -due to hypercalcemia, most likely -improved after IVF's -no metastasis or acute stroke on MRI -will monitor  Cavernous hemangioma with small bleeding -hold heparin products  -SCD's for DVT -patient to have repeat MRI of brain in 6-8 weeks  Hypercalcemia  -Possibly secondary to dehydration vs malignancy.  -TSH WNL -PTH pending -calcium WNL now after IVF's. -will monitor.   AKI  -Secondary to dehydration and continue use of diuretics -continue holding diuretics -renal function pretty much back to normal and AKI resolved -encourage patient to eat and drink; will adjust IVF's rate in am  DCIS right breast since 2007  -strongly ER/PR positive status post lumpectomy 07/12/2006. Postop radiation followed by tamoxifen. Right axillary lymph node enlargement found at that time with biopsy showing CLL versus WDLL .  -continue tamoxifen. Recent images studies for follow up demonstarted stability  Endometrial cancer stage IA, grade 3, mixed endometrioid and clear cell ,  -diagnosed January 2010 status post robotic hysterectomy with BSO 09/02/2008 followed by postoperative adjuvant chemotherapy with carboplatinum plus Taxol then brachytherapy to the vaginal vault between May 13 and 11/27/08. -appears to be stable and in remission    CLL initial stage I , 2013  -17 chromosome abnormality on marrow biopsy. Treatment with 6 cycles of Bendamustine plus Rituxan between 05/03/2012 and 09/21/2012 with a complete response.  -Last WBC approx 1 month ago 13,000.  -Now 39,000; might need CT scans for re-staging  and bone marrow biopsy -Oncology on board; will follow recommendations.  Essential hypertension, benign  Continue lisinopril.  Stable. -continue holding HCTZ due to dehydration  HYPERLIPIDEMIA  continue statin   Chronic compensated diastolic dysfunctionn: Last echo with EF WNL (60%) -continue holding diuretics -continue IVF's -strict I's and O's and daily weight.  Moderate protein calorie malnutrition -continue feeding supplements BID -nutrition following patient  Physical deconditioning -PT recommending SNF if unable to 24/7 supervision assistance unable to be provided at discharge -will follow clinical response   Code Status: Full Family Communication: sister and brother in law at bedside Disposition Plan: to be determine   Consultants:  Oncology service  Procedures:  See below for x-ray reports  Antibiotics:  None   HPI/Subjective: Feeling better. Mentation improved, but still confuse (inside thoughts, situation and reason for admission)  Objective: Filed Vitals:   10/08/13 1356  BP: 153/70  Pulse: 73  Temp: 97.9 F (36.6 C)  Resp: 20    Intake/Output Summary (Last 24 hours) at 10/08/13 1641 Last data filed at 10/08/13 1356  Gross per 24 hour  Intake 2738.33 ml  Output   1600 ml  Net 1138.33 ml   Filed Weights   10/07/13 1556  Weight: 65.6 kg (144 lb 10 oz)    Exam:   General:  Afebrile, feeling better. Still slightly confuse (having difficulties with inside thoughts)  Cardiovascular: regular rate, no rubs or gallops  Respiratory: CTA bilaterally  Abdomen: soft, NT, ND, positive BS  Musculoskeletal: no edema, no cyanosis  Data Reviewed: Basic Metabolic Panel:  Recent Labs Lab 10/07/13 1210 10/08/13 0350  NA 140 139  K 5.2 3.8  CL 99 102  CO2 23 25  GLUCOSE 122* 83  BUN 45* 33*  CREATININE 1.05  0.89  CALCIUM 12.3* 10.5   Liver Function Tests:  Recent Labs Lab 10/07/13 1210 10/08/13 0350  AST 77* 74*  ALT 25 21   ALKPHOS 197* 184*  BILITOT 1.6* 1.3*  PROT 7.0 5.6*  ALBUMIN 3.5 2.7*   CBC:  Recent Labs Lab 10/07/13 1210 10/08/13 0350  WBC 49.8* 39.7*  NEUTROABS 10.0* 9.5*  HGB 12.6 10.7*  HCT 38.7 33.2*  MCV 87.8 88.3  PLT 174 138*   CBG:  Recent Labs Lab 10/07/13 1203  GLUCAP 124*    Recent Results (from the past 240 hour(s))  CULTURE, BLOOD (ROUTINE X 2)     Status: None   Collection Time    10/07/13  4:25 PM      Result Value Ref Range Status   Specimen Description BLOOD LEFT HAND   Final   Special Requests BOTTLES DRAWN AEROBIC AND ANAEROBIC 10CC   Final   Culture  Setup Time     Final   Value: 10/07/2013 19:07     Performed at [facility]   Culture     Final   Value:        BLOOD CULTURE RECEIVED NO GROWTH TO DATE CULTURE WILL BE HELD FOR 5 DAYS BEFORE ISSUING A FINAL NEGATIVE REPORT     Performed at [facility]   Report Status PENDING   Incomplete  CULTURE, BLOOD (ROUTINE X 2)     Status: None   Collection Time    10/07/13  4:33 PM      Result Value Ref Range Status   Specimen Description BLOOD LEFT ARM   Final   Special Requests BOTTLES DRAWN AEROBIC AND ANAEROBIC 10CC   Final   Culture  Setup Time     Final   Value: 10/07/2013 19:07     Performed at [facility]   Culture     Final   Value:        BLOOD CULTURE RECEIVED NO GROWTH TO DATE CULTURE WILL BE HELD FOR 5 DAYS BEFORE ISSUING A FINAL NEGATIVE REPORT     Performed at [facility]   Report Status PENDING   Incomplete     Studies: Dg Chest 2 View  10/07/2013   CLINICAL DATA:  Chest pain, weakness and history of CHF.  EXAM: CHEST - 2 VIEW  COMPARISON:  CT CHEST W/CM dated 07/05/2012; DG CHEST 2 VIEW dated 05/04/2006  FINDINGS: Stable mild chronic lung disease. There is no evidence of pulmonary edema, consolidation, pneumothorax, nodule or pleural fluid. The heart size and mediastinal contours are within normal limits. The bony thorax is unremarkable.  IMPRESSION: No active  disease.   Electronically Signed   By: Irish Lack M.D.   On: 10/07/2013 15:17   Ct Head Wo Contrast  10/07/2013   CLINICAL DATA:  Confusion, history of bilateral breast cancer  EXAM: CT HEAD WITHOUT CONTRAST  TECHNIQUE: Contiguous axial images were obtained from the base of the skull through the vertex without intravenous contrast.  COMPARISON:  None.  FINDINGS: Calvarium intact. No hemorrhage or extra-axial fluid. No evidence of vascular territory infarct or mass effect. No hydrocephalus. Mild age-related atrophy.  IMPRESSION: No acute findings   Electronically Signed   By: Esperanza Heir M.D.   On: 10/07/2013 13:39   Mr Laqueta Jean SQ Contrast  10/08/2013   CLINICAL DATA:  New onset altered mental status. History of breast and endometrial cancer. History of leukemia.  EXAM: MRI HEAD WITHOUT AND WITH CONTRAST  TECHNIQUE: Multiplanar, multiecho pulse sequences of the brain and surrounding structures were obtained without and with intravenous contrast.  CONTRAST:  23m MULTIHANCE GADOBENATE DIMEGLUMINE 529 MG/ML IV SOLN  COMPARISON:  CT HEAD W/O CM dated 10/07/2013  FINDINGS: A focal area of susceptibility is noted in the posterior left temporal lobe. There is some intrinsic T1 and T2 signal associated with this area suggesting acute/ subacute blood products. There is no enhancement associated with the lesion. No other focal areas of hemorrhage are evident. In retrospect, there is subtle density on the CT scan of the head from yesterday in the same area.  No acute infarct formed other mass lesion is present.  Mild periventricular and subcortical diffuse white matter T2 hyperintensity is evident bilaterally. Mild atrophy is present. The ventricles are of normal size. Flow is present in the major intracranial arteries. The patient is status post bilateral lens replacements. The globes and orbits are otherwise intact. The paranasal sinuses and mastoid air cells are clear.  IMPRESSION: 1. Focal 1 cm area in the  left temporal lobe with significant susceptibility and central T1 and T2 hyperintensity compatible with acute/subacute hemorrhage. This may be in the setting of a more remote hemorrhage. The appearance favors that of a cavernous hemangioma width rebleed. There is no enhancement to suggest metastasis. Follow-up MRI in 6-8 weeks without and with contrast would be useful to assure expected evolution of this lesion. 2. No other areas of focal hemorrhage. 3. Mild atrophy and white matter disease. This is nonspecific, but likely reflects the sequela of chronic microvascular ischemia. Critical Value/emergent results were called by telephone at the time of interpretation on 10/08/2013 at 9:39 AM to Dr. NLouellen Molder, who verbally acknowledged these results.   Electronically Signed   By: CLawrence SantiagoM.D.   On: 10/08/2013 09:39    Scheduled Meds: . aspirin EC  81 mg Oral Daily  . atorvastatin  40 mg Oral QHS  . calcium carbonate  1 tablet Oral BID WC  . cholecalciferol  2,000 Units Oral Daily  . enoxaparin (LOVENOX) injection  40 mg Subcutaneous Q24H  . feeding supplement (ENSURE COMPLETE)  237 mL Oral BID BM  . lisinopril  5 mg Oral Daily  . loratadine  10 mg Oral Daily  . multivitamin with minerals  1 tablet Oral Daily  . potassium chloride SA  20 mEq Oral Daily  . sodium chloride  3 mL Intravenous Q12H   Continuous Infusions: . sodium chloride 125 mL/hr at 10/08/13 1000    Principal Problem:   SIRS (systemic inflammatory response syndrome) Active Problems:   DUCTAL CARCINOMA IN SITU, RIGHT BREAST   HYPERLIPIDEMIA   Essential hypertension, benign   DIASTOLIC DYSFUNCTION   CLL (chronic lymphocytic leukemia)   Uterine cancer   Dehydration   Hypercalcemia   Malnutrition of moderate degree    Time spent: >30 minutes    MBarton Dubois [facility] Pager [phone] If 7PM-7AM, please contact night-coverage at [email or web address], password [facility]3/31/2015, 4:41 PM  LOS: 1 day

## 2013-10-09 ENCOUNTER — Encounter (HOSPITAL_COMMUNITY): Payer: Self-pay | Admitting: Radiology

## 2013-10-09 ENCOUNTER — Inpatient Hospital Stay (HOSPITAL_COMMUNITY): Payer: Medicare Other

## 2013-10-09 LAB — COMPREHENSIVE METABOLIC PANEL
ALBUMIN: 2.7 g/dL — AB (ref 3.5–5.2)
ALT: 22 U/L (ref 0–35)
AST: 68 U/L — ABNORMAL HIGH (ref 0–37)
Alkaline Phosphatase: 184 U/L — ABNORMAL HIGH (ref 39–117)
BUN: 16 mg/dL (ref 6–23)
CHLORIDE: 98 meq/L (ref 96–112)
CO2: 25 mEq/L (ref 19–32)
CREATININE: 0.69 mg/dL (ref 0.50–1.10)
Calcium: 10.1 mg/dL (ref 8.4–10.5)
GFR calc Af Amer: >90 mL/min (ref >90)
GFR, EST NON AFRICAN AMERICAN: 81 mL/min — AB (ref >90)
Glucose, Bld: 97 mg/dL (ref 70–99)
Potassium: 3.5 mEq/L — ABNORMAL LOW (ref 3.7–5.3)
Sodium: 138 mEq/L (ref 137–147)
Total Bilirubin: 1.2 mg/dL (ref 0.3–1.2)
Total Protein: 6.1 g/dL (ref 6.0–8.3)

## 2013-10-09 LAB — CBC WITH DIFFERENTIAL/PLATELET
Basophils Absolute: 0 10*3/uL (ref 0.0–0.1)
Basophils Relative: 0 % (ref 0–1)
EOS PCT: 2 % (ref 0–5)
Eosinophils Absolute: 1 10*3/uL — ABNORMAL HIGH (ref 0.0–0.7)
HCT: 35.5 % — ABNORMAL LOW (ref 36.0–46.0)
Hemoglobin: 11.3 g/dL — ABNORMAL LOW (ref 12.0–15.0)
LYMPHS ABS: 43.9 10*3/uL — AB (ref 0.7–4.0)
Lymphocytes Relative: 87 % — ABNORMAL HIGH (ref 12–46)
MCH: 28.3 pg (ref 26.0–34.0)
MCHC: 31.8 g/dL (ref 30.0–36.0)
MCV: 89 fL (ref 78.0–100.0)
MONO ABS: 0.5 10*3/uL (ref 0.1–1.0)
Monocytes Relative: 1 % — ABNORMAL LOW (ref 3–12)
Neutro Abs: 5 10*3/uL (ref 1.7–7.7)
Neutrophils Relative %: 10 % — ABNORMAL LOW (ref 43–77)
Platelets: 131 10*3/uL — ABNORMAL LOW (ref 150–400)
RBC: 3.99 MIL/uL (ref 3.87–5.11)
RDW: 20.1 % — AB (ref 11.5–15.5)
WBC: 50.4 10*3/uL (ref 4.0–10.5)

## 2013-10-09 LAB — PTH, INTACT AND CALCIUM: Calcium, Total (PTH): 10.1 mg/dL (ref 8.4–10.5)

## 2013-10-09 MED ORDER — ZOLEDRONIC ACID 4 MG/5ML IV CONC
4.0000 mg | Freq: Once | INTRAVENOUS | Status: DC
  Filled 2013-10-09 (×2): qty 5

## 2013-10-09 MED ORDER — IOHEXOL 300 MG/ML  SOLN
25.0000 mL | INTRAMUSCULAR | Status: AC
  Administered 2013-10-09 (×2): 25 mL via ORAL

## 2013-10-09 MED ORDER — IOHEXOL 300 MG/ML  SOLN
100.0000 mL | Freq: Once | INTRAMUSCULAR | Status: AC | PRN
  Administered 2013-10-09: 100 mL via INTRAVENOUS

## 2013-10-09 NOTE — Progress Notes (Signed)
[place]  [phone]    HOSPITAL PROGRESS NOTE I have seen the patient, examined her and edited the progress notes as follow:  Events since 3/31 noted.  She is still weak, but appetite is improving.  Ensure increased to tid.  Acute encephalopathy developed on 3/30, for which she had an MRI of the brain, negative for mets, but a cavernous hemangioma with small bleeding was noted, thus, heparin products were held and SCD's were placed for DVT prophylaxis. Her mental status improved as hypercalcemia normalized. However, her WBC rose to 50.4 this morning (from 39 prior). Physical Therapy assisting patient to increase ambulation. Denies fever, chills or night sweats. No myalgia.  No other events overnight.   MEDICATIONS:s  Scheduled Meds: . aspirin EC  81 mg Oral Daily  . atorvastatin  40 mg Oral QHS  . calcium carbonate  1 tablet Oral BID WC  . cholecalciferol  2,000 Units Oral Daily  . feeding supplement (ENSURE COMPLETE)  237 mL Oral BID BM  . lisinopril  5 mg Oral Daily  . loratadine  10 mg Oral Daily  . multivitamin with minerals  1 tablet Oral Daily  . potassium chloride SA  20 mEq Oral Daily  . sodium chloride  3 mL Intravenous Q12H   Continuous Infusions: . sodium chloride 125 mL/hr at 10/09/13 0525   PRN Meds:.acetaminophen, acetaminophen, etodolac, fentaNYL, ondansetron (ZOFRAN) IV, ondansetron ALLERGIES:   Allergies  Allergen Reactions  . Dilaudid [Hydromorphone Hcl] Nausea And Vomiting     PHYSICAL EXAMINATION:   Filed Vitals:   10/09/13 0427  BP: 95/77  Pulse: 91  Temp: 97.8 F (36.6 C)  Resp: 18     Filed Weights   10/07/13 1556  Weight: 144 lb 10 oz (65.58 kg)    80 year old  in no acute distress,   GENERAL:alert, no distress and comfortable. She looks thin but not cachectic  SKIN: skin color, texture, turgor are normal, no rashes or significant lesions . Few areas of ecchymoses on the left arm. EYES: normal, conjunctiva  are pink and non-injected, sclera clear  OROPHARYNX:no exudate, no erythema and lips, buccal mucosa, and tongue normal  NECK: supple, thyroid normal size, non-tender, without nodularity  LYMPH: no palpable lymphadenopathy in the cervical, axillary or inguinal  LUNGS: clear to auscultation and percussion with normal breathing effort  HEART: regular rate & rhythm and no murmurs and no lower extremity edema  ABDOMEN:abdomen soft, non-tender and normal bowel sounds  Musculoskeletal:no cyanosis of digits and no clubbing  PSYCH: alert,, with fluent speech  NEURO: no focal motor/sensory deficits except for impaired hearing  LABORATORY/RADIOLOGY DATA:   Recent Labs Lab 10/07/13 1210 10/08/13 0350 10/09/13 0343  WBC 49.8* 39.7* 50.4*  HGB 12.6 10.7* 11.3*  HCT 38.7 33.2* 35.5*  PLT 174 138* 131*  MCV 87.8 88.3 89.0  MCH 28.6 28.5 28.3  MCHC 32.6 32.2 31.8  RDW 19.3* 19.6* 20.1*  LYMPHSABS 37.8* 29.4* 43.9*  MONOABS 2.0* 0.8 0.5  EOSABS 0.0 0.0 1.0*  BASOSABS 0.0 0.0 0.0    CMP    Recent Labs Lab 10/07/13 1210 10/08/13 0350 10/09/13 0343  NA 140 139 138  K 5.2 3.8 3.5*  CL 99 102 98  CO2 $Re'23 25 25  'sAT$ GLUCOSE 122* 83 97  BUN 45* 33* 16  CREATININE 1.05 0.89 0.69  CALCIUM 12.3* 10.5 10.1  AST 77* 74* 68*  ALT $Re'25 21 22  'Fnw$ ALKPHOS 197* 184* 184*  BILITOT 1.6* 1.3* 1.2  Component Value Date/Time   BILITOT 1.2 10/09/2013 0343   BILITOT 0.56 09/02/2013 0930    Anemia panel:    Recent Labs  10/07/13 1210  VITAMINB12 >2000*     Recent Labs  10/07/13 1210  TSH 2.605      Liver Function Tests:  Recent Labs Lab 10/07/13 1210 10/08/13 0350 10/09/13 0343  AST 77* 74* 68*  ALT $Re'25 21 22  'noP$ ALKPHOS 197* 184* 184*  BILITOT 1.6* 1.3* 1.2  PROT 7.0 5.6* 6.1  ALBUMIN 3.5 2.7* 2.7*    CBG:  Recent Labs Lab 10/07/13 1203  GLUCAP 124*   Thyroid function studies  Recent Labs  10/07/13 1210  TSH 2.605    Radiology Studies: MRI  On 3/31 showed  focal 1 cm area in the left temporal lobe with significant susceptibility and central T1 and T2 hyperintensity compatible with acute/subacute hemorrhage. This may be in the setting of a more remote hemorrhage. The appearance favors that of a cavernous hemangioma width rebleed   80 year old lady with high risk of CLL, treated a year ago, now found to have new onset weakness, anorexia, weight loss, and progressive leukocytosis with probable malignant hypercalcemia   #1 CLL  The pattern of persistent leukocytosis this compatible with disease progression.  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  Repeat CBC today shows WBC of  50.4 from 39.7. On admission WBC was 49.8 . She needs staging scans and repeat bone marrow biopsy rule out recurrence.  She will require outpatient follow up at the [facility]. I discussed this with her attending physician and I plan to order staging CT scan of chest, abdomen and pelvis with contrast for further evaluation.  #2 hypercalcemia  Likely malignant. It has improved to 10.1  from 12.3 on admission. Continue  Hydration . PTH level is low and this is compatible with malignancy.   #3 weakness and unspecified weight loss  Appreciate Nutrition evaluation. Tolerating Ensure tid well. No appetite stimulants needed to get initiated yet.  #4 acute renal failure   resolved with aggressive fluid resuscitation, likely due to recent hypercalcemia  #5 elevated AST and total bilirubin  It is not clear whether this is abnormal liver function test. The AST could be due to recent fall, and is slowly improving. The elevated bilirubin is of unclear etiology, although improving (1.2 from 1.6 on admission).  LDH is very high at 677 (from 722 on admission). DAT negative.  #6 recent fall   Appreciate Physical Therapy to assess her strength while hospitalized. She is to be discharged to a skilled nursing facility as she will need 24 hr assistance.   #7 history of DCIS  Recent  mammography and biopsy confirmed no evidence of disease recurrence   #8 history of uterine cancer  Clinically, she has no evidence of disease recurrence.   #9 Abnormal MRI Continue observation. Due for another MRI in 6-8 weeks.Likely related to recent fall.   Overall, I am most concerned about malignant hypercalcemia as the cause of her confusion & fall leading to her hospitalization. After discussion with her attending physician, we are in agreement to start staging work-up as she may need to be treated now to prevent recurrent hospitalization  WERTMAN,SARA E, PA-C 10/09/2013, 7:17 AM  [place], Vivan Vanderveer, MD 10/09/2013

## 2013-10-09 NOTE — Progress Notes (Signed)
   CARE MANAGEMENT NOTE 10/09/2013  Patient:  Margaret Mann, Margaret Mann   Account Number:  [id number]  Date Initiated:  10/09/2013  Documentation initiated by:  Kutter Schnepf,COOKIE  Subjective/Objective Assessment:   pt admitted with cco SIRS from home, fall at home     Action/Plan:   SNF   Anticipated DC Date:  10/10/2013   Anticipated DC Plan:  [place] referral  Clinical Social Worker      DC Planning Services  CM consult      Choice offered to / List presented to:             Status of service:  In process, will continue to follow Medicare Important Message given?  NO (If response is "NO", the following Medicare IM given date fields will be blank) Date Medicare IM given:   Date Additional Medicare IM given:    Discharge Disposition:    Per UR Regulation:  Reviewed for med. necessity/level of care/duration of stay  If discussed at [place] of Stay Meetings, dates discussed:    Comments:  10/09/13 MMCGIBBONEY, RN, BSN Pt admitted with AMS, fell at home plan to dc to SNF.

## 2013-10-09 NOTE — Progress Notes (Signed)
CRITICAL VALUE ALERT  Critical value received:  WBC 50.4 Date of notification:  4/1  Time of notification:  0411  Critical value read back:yes  Nurse who received alert:  Dellie Catholic  MD notified (1st page):  yes  Time of first page:  [phone]

## 2013-10-09 NOTE — Progress Notes (Signed)
[facility] PROGRESS NOTE  Margaret Mann OYD:[id number] DOB: 06/20/1933 DOA: 10/07/2013 PCP: Osborne Casco, MD  Assessment/Plan: SIRS (systemic inflammatory response syndrome)  -WBC's remaisn elevated most likely due to CLL -tachycardia resolved after IVF's -no signs of infection -SIRS now resolved -Will complete in-hospital workup of malignancy including CT scans and potential bone marrow biopsy as she is at very high risk for decompensation and readmission to the hospital  Acute encephalopathy -due to hypercalcemia, most likely -improved after IVF's -no metastasis or acute stroke on MRI -will monitor  Cavernous hemangioma with small bleeding -hold heparin products  -SCD's for DVT -patient to have repeat MRI of brain in 6-8 weeks  Hypercalcemia , corrected calcium = 11.14 -Secondary to malignancy.  -TSH WNL -PTH LESS than 2.5, calcium 10.1-potentially some contribution from hydrochlorothiazide, unlikely vitamin D excess/milk alkali syndrome -Given one dose of zoledronic acid 4/1 -will monitor.   AKI  -Secondary to dehydration and continue use of diuretics -continue holding diuretics -renal function pretty much back to normal and AKI resolved -encourage patient to eat and drink; IV fluid rate  50 cc per hour from 125 cc per hour 4/1  DCIS right breast since 2007  -strongly ER/PR positive status post lumpectomy 07/12/2006. Postop radiation followed by tamoxifen. Right axillary lymph node enlargement found at that time with biopsy showing CLL versus WDLL .  -continue tamoxifen. Recent images studies for follow up demonstarted stability  Endometrial cancer stage IA, grade 3, mixed endometrioid and clear cell ,  -diagnosed January 2010 status post robotic hysterectomy with BSO 09/02/2008 followed by postoperative adjuvant chemotherapy with carboplatinum plus Taxol then brachytherapy to the vaginal vault between May 13 and 11/27/08. -appears to be stable and in  remission    CLL initial stage I , 2013  -17 chromosome abnormality on marrow biopsy. Treatment with 6 cycles of Bendamustine plus Rituxan between 05/03/2012 and 09/21/2012 with a complete response.  -Last WBC approx 1 month ago 13,000.  -Now 50,000 might need CT scans for re-staging and bone marrow biopsy -Oncology on board; they have ordered direct antiglobulin test, CT scans of entire body for staging  Essential hypertension, benign  Continue lisinopril.  Stable. -continue holding HCTZ due to dehydration, tendency to cause hypercalcemia  HYPERLIPIDEMIA  continue statin   Chronic compensated diastolic dysfunctionn: Last echo with EF WNL (60%) -continue holding diuretics -continue IVF's -strict I's and O's and daily weight.  Moderate protein calorie malnutrition -continue feeding supplements BID -nutrition following patient  Physical deconditioning -PT recommending SNF if unable to 24/7 supervision assistance unable to be provided at discharge -will follow clinical response   Code Status: Full Family Communication: None at bedside, ? # fr HCPOA  Folks,Robert Relative [phone] Disposition Plan: to be determine   Consultants:  Oncology service  Procedures:  See below for x-ray reports  Antibiotics:  None   HPI/Subjective:  Still somewhat confused however doing fair Did not eat much today Nursing reports the a lot of assistance  Objective: Filed Vitals:   10/09/13 1313  BP: 147/85  Pulse: 80  Temp: 97.9 F (36.6 C)  Resp: 19    Intake/Output Summary (Last 24 hours) at 10/09/13 1658 Last data filed at 10/09/13 1408  Gross per 24 hour  Intake 4076.67 ml  Output   3075 ml  Net 1001.67 ml   Filed Weights   10/07/13 1556  Weight: 65.6 kg (144 lb 10 oz)    Exam:   General:  Afebrile, feeling better. Still slightly confuse (  having difficulties with inside thoughts)  Cardiovascular: regular rate, no rubs or  gallops  Respiratory: CTA bilaterally  Abdomen: soft, NT, ND, positive BS  Musculoskeletal: no edema, no cyanosis  Data Reviewed: Basic Metabolic Panel:  Recent Labs Lab 10/07/13 1210 11/07/13 0350 10/09/13 0343  NA 140 139 138  K 5.2 3.8 3.5*  CL 99 102 98  CO2 $Re'23 25 25  'ojB$ GLUCOSE 122* 83 97  BUN 45* 33* 16  CREATININE 1.05 0.89 0.69  CALCIUM 12.3* 10.5  10.1 10.1   Liver Function Tests:  Recent Labs Lab 10/07/13 1210 11/07/2013 0350 10/09/13 0343  AST 77* 74* 68*  ALT $Re'25 21 22  'poE$ ALKPHOS 197* 184* 184*  BILITOT 1.6* 1.3* 1.2  PROT 7.0 5.6* 6.1  ALBUMIN 3.5 2.7* 2.7*   CBC:  Recent Labs Lab 10/07/13 1210 11/07/2013 0350 10/09/13 0343  WBC 49.8* 39.7* 50.4*  NEUTROABS 10.0* 9.5* 5.0  HGB 12.6 10.7* 11.3*  HCT 38.7 33.2* 35.5*  MCV 87.8 88.3 89.0  PLT 174 138* 131*   CBG:  Recent Labs Lab 10/07/13 1203  GLUCAP 124*    Recent Results (from the past 240 hour(s))  CULTURE, BLOOD (ROUTINE X 2)     Status: None   Collection Time    10/07/13  4:25 PM      Result Value Ref Range Status   Specimen Description BLOOD LEFT HAND   Final   Special Requests BOTTLES DRAWN AEROBIC AND ANAEROBIC 10CC   Final   Culture  Setup Time     Final   Value: 10/07/2013 19:07     Performed at [facility]   Culture     Final   Value:        BLOOD CULTURE RECEIVED NO GROWTH TO DATE CULTURE WILL BE HELD FOR 5 DAYS BEFORE ISSUING A FINAL NEGATIVE REPORT     Performed at [facility]   Report Status PENDING   Incomplete  CULTURE, BLOOD (ROUTINE X 2)     Status: None   Collection Time    10/07/13  4:33 PM      Result Value Ref Range Status   Specimen Description BLOOD LEFT ARM   Final   Special Requests BOTTLES DRAWN AEROBIC AND ANAEROBIC 10CC   Final   Culture  Setup Time     Final   Value: 10/07/2013 19:07     Performed at [facility]   Culture     Final   Value:        BLOOD CULTURE RECEIVED NO GROWTH TO DATE CULTURE WILL BE HELD FOR 5 DAYS  BEFORE ISSUING A FINAL NEGATIVE REPORT     Performed at [facility]   Report Status PENDING   Incomplete     Studies: Mr Jeri Cos Wo Contrast  2013/11/07   CLINICAL DATA:  New onset altered mental status. History of breast and endometrial cancer. History of leukemia.  EXAM: MRI HEAD WITHOUT AND WITH CONTRAST  TECHNIQUE: Multiplanar, multiecho pulse sequences of the brain and surrounding structures were obtained without and with intravenous contrast.  CONTRAST:  9mL MULTIHANCE GADOBENATE DIMEGLUMINE 529 MG/ML IV SOLN  COMPARISON:  CT HEAD W/O CM dated 10/07/2013  FINDINGS: A focal area of susceptibility is noted in the posterior left temporal lobe. There is some intrinsic T1 and T2 signal associated with this area suggesting acute/ subacute blood products. There is no enhancement associated with the lesion. No other focal areas of hemorrhage are evident. In retrospect,  there is subtle density on the CT scan of the head from yesterday in the same area.  No acute infarct formed other mass lesion is present.  Mild periventricular and subcortical diffuse white matter T2 hyperintensity is evident bilaterally. Mild atrophy is present. The ventricles are of normal size. Flow is present in the major intracranial arteries. The patient is status post bilateral lens replacements. The globes and orbits are otherwise intact. The paranasal sinuses and mastoid air cells are clear.  IMPRESSION: 1. Focal 1 cm area in the left temporal lobe with significant susceptibility and central T1 and T2 hyperintensity compatible with acute/subacute hemorrhage. This may be in the setting of a more remote hemorrhage. The appearance favors that of a cavernous hemangioma width rebleed. There is no enhancement to suggest metastasis. Follow-up MRI in 6-8 weeks without and with contrast would be useful to assure expected evolution of this lesion. 2. No other areas of focal hemorrhage. 3. Mild atrophy and white matter disease. This is  nonspecific, but likely reflects the sequela of chronic microvascular ischemia. Critical Value/emergent results were called by telephone at the time of interpretation on 10/08/2013 at 9:39 AM to Dr. Louellen Molder , who verbally acknowledged these results.   Electronically Signed   By: Lawrence Santiago M.D.   On: 10/08/2013 09:39    Scheduled Meds: . aspirin EC  81 mg Oral Daily  . atorvastatin  40 mg Oral QHS  . calcium carbonate  1 tablet Oral BID WC  . cholecalciferol  2,000 Units Oral Daily  . feeding supplement (ENSURE COMPLETE)  237 mL Oral BID BM  . lisinopril  5 mg Oral Daily  . loratadine  10 mg Oral Daily  . multivitamin with minerals  1 tablet Oral Daily  . potassium chloride SA  20 mEq Oral Daily  . sodium chloride  3 mL Intravenous Q12H   Continuous Infusions: . sodium chloride 125 mL/hr at 10/09/13 1436    Principal Problem:   SIRS (systemic inflammatory response syndrome) Active Problems:   DUCTAL CARCINOMA IN SITU, RIGHT BREAST   HYPERLIPIDEMIA   Essential hypertension, benign   DIASTOLIC DYSFUNCTION   CLL (chronic lymphocytic leukemia)   Uterine cancer   Dehydration   Hypercalcemia   Malnutrition of moderate degree    Time spent: >30 minutes    Nita Sells  [facility] Pager [phone]. If 7PM-7AM, please contact night-coverage at [email or web address], password [facility] 10/09/2013, 4:58 PM  LOS: 2 days

## 2013-10-10 ENCOUNTER — Other Ambulatory Visit: Payer: Self-pay | Admitting: Hematology and Oncology

## 2013-10-10 DIAGNOSIS — R930 Abnormal findings on diagnostic imaging of skull and head, not elsewhere classified: Secondary | ICD-10-CM

## 2013-10-10 LAB — CBC WITH DIFFERENTIAL/PLATELET
Band Neutrophils: 0 % (ref 0–10)
Basophils Absolute: 0.4 10*3/uL — ABNORMAL HIGH (ref 0.0–0.1)
Basophils Relative: 1 % (ref 0–1)
Blasts: 0 %
EOS PCT: 1 % (ref 0–5)
Eosinophils Absolute: 0.4 10*3/uL (ref 0.0–0.7)
HCT: 34.2 % — ABNORMAL LOW (ref 36.0–46.0)
Hemoglobin: 11.1 g/dL — ABNORMAL LOW (ref 12.0–15.0)
LYMPHS ABS: 32.5 10*3/uL — AB (ref 0.7–4.0)
Lymphocytes Relative: 73 % — ABNORMAL HIGH (ref 12–46)
MCH: 28.7 pg (ref 26.0–34.0)
MCHC: 32.5 g/dL (ref 30.0–36.0)
MCV: 88.4 fL (ref 78.0–100.0)
MONO ABS: 2.7 10*3/uL — AB (ref 0.1–1.0)
MONOS PCT: 6 % (ref 3–12)
Metamyelocytes Relative: 0 %
Myelocytes: 0 %
NEUTROS PCT: 19 % — AB (ref 43–77)
NRBC: 0 /100{WBCs}
Neutro Abs: 8.4 10*3/uL — ABNORMAL HIGH (ref 1.7–7.7)
PLATELETS: 129 10*3/uL — AB (ref 150–400)
Promyelocytes Absolute: 0 %
RBC: 3.87 MIL/uL (ref 3.87–5.11)
RDW: 20.6 % — ABNORMAL HIGH (ref 11.5–15.5)
WBC: 44.4 10*3/uL — AB (ref 4.0–10.5)

## 2013-10-10 LAB — COMPREHENSIVE METABOLIC PANEL
ALT: 25 U/L (ref 0–35)
AST: 70 U/L — ABNORMAL HIGH (ref 0–37)
Albumin: 2.7 g/dL — ABNORMAL LOW (ref 3.5–5.2)
Alkaline Phosphatase: 202 U/L — ABNORMAL HIGH (ref 39–117)
BUN: 9 mg/dL (ref 6–23)
CO2: 25 meq/L (ref 19–32)
Calcium: 10.1 mg/dL (ref 8.4–10.5)
Chloride: 95 mEq/L — ABNORMAL LOW (ref 96–112)
Creatinine, Ser: 0.65 mg/dL (ref 0.50–1.10)
GFR, EST NON AFRICAN AMERICAN: 83 mL/min — AB (ref >90)
GLUCOSE: 97 mg/dL (ref 70–99)
Potassium: 3.5 mEq/L — ABNORMAL LOW (ref 3.7–5.3)
Sodium: 135 mEq/L — ABNORMAL LOW (ref 137–147)
Total Bilirubin: 1.1 mg/dL (ref 0.3–1.2)
Total Protein: 6.5 g/dL (ref 6.0–8.3)

## 2013-10-10 NOTE — Progress Notes (Signed)
[facility] PROGRESS NOTE  Margaret Mann NWG:[id number] DOB: 1932-12-19 DOA: 10/07/2013 PCP: Osborne Casco, MD  80 y/o ?, known h/o R breast Ca DCIS s/p R mastectomy + XRt completed rx 09/05/06, h/o IA endometrial cancer status post TAH and BSO on 09/02/2005 status post adjuvant chemotherapy and brachytherapy. Completed treatments July 2010, h/o CLL with 17 P  Likely P 53 related--Rx 6 cycles Bendamustine + Rituxan admitted with falls weakness and confusion Found to have Persistent leukocytosis consistent probably with High risk CLL Scans were perfomred and it was noted that she  Was found to have new 5.2 cm L supraclavicular NOdal mass in sup mediastinum, New Multifocal hepatic mets with significant overall disease progression    Assessment/Plan: SIRS (systemic inflammatory response syndrome)  -WBC's remaisn elevated most likely due to CLL -tachycardia resolved after IVF's -no signs of infection -SIRS now resolved -Will complete in-hospital workup of malignancy including CT scans - await final word from oncology regarding plans   Acute encephalopathy -due to hypercalcemia, most likely -improved after IVF's -no metastasis or acute stroke on MRI -will monitor  Cavernous hemangioma with small bleeding -hold heparin products   Hypercalcemia , corrected calcium = 11.14 -Secondary to malignancy but was on supplements as well caclicium carbonate -TSH WNL -PTH LESS than 2.5, calcium 10.1-potentially some contribution from hydrochlorothiazide, unlikely vitamin D excess/milk alkali syndrome -to receive Zometa 4/2 -rpt labs am  AKI  -Secondary to dehydration and continue use of diuretics -d/c diuretics -encourage patient to eat and drink; IV fluid rate  50 cc per hour from 125 cc per hour 4/1  DCIS right breast since 2007  -strongly ER/PR positive status post lumpectomy 07/12/2006. Postop radiation followed by tamoxifen. Right axillary lymph node enlargement found at  that time with biopsy showing CLL versus WDLL .  -continue tamoxifen. Recent images studies for follow up demonstarted stability  Endometrial cancer stage IA, grade 3, mixed endometrioid and clear cell ,  -diagnosed January 2010 status post robotic hysterectomy with BSO 09/02/2008 followed by postoperative adjuvant chemotherapy with carboplatinum plus Taxol then brachytherapy to the vaginal vault between May 13 and 11/27/08. -appears to be stable and in remission    CLL initial stage I , 2013  -17 chromosome abnormality on marrow biopsy. Treatment with 6 cycles of Bendamustine plus Rituxan between 05/03/2012 and 09/21/2012 with a complete response.  -Last WBC approx 1 month ago 13,000.  -Now 50,000 might need CT scans for re-staging and bone marrow biopsy -Oncology on board; they have ordered direct antiglobulin test, CT scans of entire body for staging  Essential hypertension, benign  Continue lisinopril.  Stable. -continue holding HCTZ due to dehydration, tendency to cause hypercalcemia  HYPERLIPIDEMIA  continue statin   Chronic compensated diastolic dysfunctionn: Last echo with EF WNL (60%) -continue holding diuretics -continue IVF's -strict I's and O's and daily weight.  Moderate protein calorie malnutrition -continue feeding supplements BID -nutrition following patient  Physical deconditioning -PT recommending SNF if unable to 24/7 supervision assistance unable to be provided at discharge -will follow clinical response   Code Status: Full Family Communication: None at bedside, ? # fr [place] Relative [phone] Disposition Plan: to be determine   Consultants:  Oncology service  Procedures:  See below for x-ray reports  Antibiotics:  None   HPI/Subjective:  A little confused Seems to broadly understand she is not well Some mild nosebleed today that resolved.   No cp tol some diet Ambulated this am c  therapy  Objective: Filed Vitals:   10/10/13 1419  BP: 160/93  Pulse: 82  Temp: 98 F (36.7 C)  Resp: 24    Intake/Output Summary (Last 24 hours) at 10/10/13 1437 Last data filed at 10/10/13 1420  Gross per 24 hour  Intake 2589.59 ml  Output   2500 ml  Net  89.59 ml   Filed Weights   10/07/13 1556 10/10/13 0435  Weight: 65.6 kg (144 lb 10 oz) 65 kg (143 lb 4.8 oz)    Exam:   General:  Afebrile, feeling better. Still slightly confused,cannot express well  Cardiovascular: regular rate, no rubs or gallops  Respiratory: CTA bilaterally  Abdomen: soft, NT, ND, positive BS  Musculoskeletal: no edema, no cyanosis  Data Reviewed: Basic Metabolic Panel:  Recent Labs Lab 10/07/13 1210 10/08/13 0350 10/09/13 0343 10/10/13 1100  NA 140 139 138 135*  K 5.2 3.8 3.5* 3.5*  CL 99 102 98 95*  CO2 $Re'23 25 25 25  'clN$ GLUCOSE 122* 83 97 97  BUN 45* 33* 16 9  CREATININE 1.05 0.89 0.69 0.65  CALCIUM 12.3* 10.5  10.1 10.1 10.1   Liver Function Tests:  Recent Labs Lab 10/07/13 1210 10/08/13 0350 10/09/13 0343 10/10/13 1100  AST 77* 74* 68* 70*  ALT $Re'25 21 22 25  'Mnm$ ALKPHOS 197* 184* 184* 202*  BILITOT 1.6* 1.3* 1.2 1.1  PROT 7.0 5.6* 6.1 6.5  ALBUMIN 3.5 2.7* 2.7* 2.7*   CBC:  Recent Labs Lab 10/07/13 1210 10/08/13 0350 10/09/13 0343 10/10/13 0820  WBC 49.8* 39.7* 50.4* 44.4*  NEUTROABS 10.0* 9.5* 5.0 8.4*  HGB 12.6 10.7* 11.3* 11.1*  HCT 38.7 33.2* 35.5* 34.2*  MCV 87.8 88.3 89.0 88.4  PLT 174 138* 131* 129*   CBG:  Recent Labs Lab 10/07/13 1203  GLUCAP 124*    Recent Results (from the past 240 hour(s))  CULTURE, BLOOD (ROUTINE X 2)     Status: None   Collection Time    10/07/13  4:25 PM      Result Value Ref Range Status   Specimen Description BLOOD LEFT HAND   Final   Special Requests BOTTLES DRAWN AEROBIC AND ANAEROBIC 10CC   Final   Culture  Setup Time     Final   Value: 10/07/2013 19:07     Performed at [facility]   Culture      Final   Value:        BLOOD CULTURE RECEIVED NO GROWTH TO DATE CULTURE WILL BE HELD FOR 5 DAYS BEFORE ISSUING A FINAL NEGATIVE REPORT     Performed at [facility]   Report Status PENDING   Incomplete  CULTURE, BLOOD (ROUTINE X 2)     Status: None   Collection Time    10/07/13  4:33 PM      Result Value Ref Range Status   Specimen Description BLOOD LEFT ARM   Final   Special Requests BOTTLES DRAWN AEROBIC AND ANAEROBIC 10CC   Final   Culture  Setup Time     Final   Value: 10/07/2013 19:07     Performed at [facility]   Culture     Final   Value:        BLOOD CULTURE RECEIVED NO GROWTH TO DATE CULTURE WILL BE HELD FOR 5 DAYS BEFORE ISSUING A FINAL NEGATIVE REPORT     Performed at [facility]   Report Status PENDING   Incomplete     Studies: Ct Chest W Contrast  10/09/2013   CLINICAL DATA:  Staging CLL  EXAM: CT CHEST, ABDOMEN, AND PELVIS WITH CONTRAST  TECHNIQUE: Multidetector CT imaging of the chest, abdomen and pelvis was performed following the standard protocol during bolus administration of intravenous contrast.  CONTRAST:  146mL OMNIPAQUE IOHEXOL 300 MG/ML  SOLN  COMPARISON:  07/05/2012  FINDINGS: CT CHEST FINDINGS  3.2 x 5.2 cm left supraclavicular nodal mass (series 2/image 5), arising from the left superior mediastinum/prevascular region (series 2/image 14). This appearance is new/markedly progressed from 2013.  Additional small mediastinal and left axillary nodes, including a 10 mm subpectoral node (series 2/image 14), at the upper limits of normal.  Small left and trace right pleural effusions. Mild dependent atelectasis in the left lower lobe. No suspicious pulmonary nodules. Mild centrilobular emphysematous changes with mild biapical pleural parenchymal scarring. No pneumothorax.  Visualized thyroid is unremarkable.  The heart is normal in size.  No pericardial effusion.  Degenerative changes of the thoracic spine.  CT ABDOMEN AND PELVIS FINDINGS   Extensive upper abdominal/retroperitoneal lymphadenopathy, significantly progressed from 2013, including:  --3.4 cm short axis portacaval node (series 2/ image 60)  --4.9 cm short axis aggregate left para-aortic nodal mass (series 2/ image 66)  --4.0 cm short axis left jejunal mesenteric nodal mass (series 2/ image 80)  --5.5 cm short axis right jejunal mesenteric nodal mass (series 2/image 80)  Suspected multifocal hypoenhancing hepatic lesions throughout the liver, including:  --1.5 x 1.5 cm lesion centrally in the anterior segment right hepatic lobe (series 2/image 47)  --2.1 x 1.8 cm lesion in the caudate (series 2/image 50)  --1.2 x 1.3 cm lesion in the medial segment left hepatic lobe (series 2/image 51)  --1.4 x 1.4 cm lesion in the anterior segment right hepatic lobe (series 2/image 59)  Mild splenomegaly, measuring 13.9 cm in maximal craniocaudal dimension. Possible subcentimeter hypoenhancing lesion inferiorly (series 2/image 57), equivocal.  Pancreas and adrenal glands are within normal limits.  Gallbladder is notable for layering gallstone (series 2/image 67), without associated inflammatory changes.  Kidneys are notable for an 11 mm lateral left lower pole renal cyst (series 4/image 39). No hydronephrosis.  No evidence of bowel obstruction.  Normal appendix.  Small volume abdominopelvic ascites.  No evidence of abdominal aortic aneurysm.  Status post hysterectomy.  No adnexal masses.  Bladder is within normal limits.  Degenerative changes of the lumbar spine.  IMPRESSION: 5.2 cm left supraclavicular nodal mass extending from the superior mediastinum, new/increased. Additional small mediastinal/ left axillary nodes.  Extensive upper abdominal/retroperitoneal lymphadenopathy, increased, as described above.  Multifocal hepatic metastases, new, with index lesions as described above.  Mild splenomegaly.  Overall appearance has significantly progressed from 2013.   Electronically Signed   By: Julian Hy M.D.   On: 10/09/2013 17:37   Ct Abdomen Pelvis W Contrast  10/09/2013   CLINICAL DATA:  Staging CLL  EXAM: CT CHEST, ABDOMEN, AND PELVIS WITH CONTRAST  TECHNIQUE: Multidetector CT imaging of the chest, abdomen and pelvis was performed following the standard protocol during bolus administration of intravenous contrast.  CONTRAST:  127mL OMNIPAQUE IOHEXOL 300 MG/ML  SOLN  COMPARISON:  07/05/2012  FINDINGS: CT CHEST FINDINGS  3.2 x 5.2 cm left supraclavicular nodal mass (series 2/image 5), arising from the left superior mediastinum/prevascular region (series 2/image 14). This appearance is new/markedly progressed from 2013.  Additional small mediastinal and left axillary nodes, including a 10 mm subpectoral node (series 2/image 14), at the upper limits of normal.  Small left and  trace right pleural effusions. Mild dependent atelectasis in the left lower lobe. No suspicious pulmonary nodules. Mild centrilobular emphysematous changes with mild biapical pleural parenchymal scarring. No pneumothorax.  Visualized thyroid is unremarkable.  The heart is normal in size.  No pericardial effusion.  Degenerative changes of the thoracic spine.  CT ABDOMEN AND PELVIS FINDINGS  Extensive upper abdominal/retroperitoneal lymphadenopathy, significantly progressed from 2013, including:  --3.4 cm short axis portacaval node (series 2/ image 60)  --4.9 cm short axis aggregate left para-aortic nodal mass (series 2/ image 66)  --4.0 cm short axis left jejunal mesenteric nodal mass (series 2/ image 80)  --5.5 cm short axis right jejunal mesenteric nodal mass (series 2/image 80)  Suspected multifocal hypoenhancing hepatic lesions throughout the liver, including:  --1.5 x 1.5 cm lesion centrally in the anterior segment right hepatic lobe (series 2/image 47)  --2.1 x 1.8 cm lesion in the caudate (series 2/image 50)  --1.2 x 1.3 cm lesion in the medial segment left hepatic lobe (series 2/image 51)  --1.4 x 1.4 cm lesion in the  anterior segment right hepatic lobe (series 2/image 59)  Mild splenomegaly, measuring 13.9 cm in maximal craniocaudal dimension. Possible subcentimeter hypoenhancing lesion inferiorly (series 2/image 57), equivocal.  Pancreas and adrenal glands are within normal limits.  Gallbladder is notable for layering gallstone (series 2/image 67), without associated inflammatory changes.  Kidneys are notable for an 11 mm lateral left lower pole renal cyst (series 4/image 39). No hydronephrosis.  No evidence of bowel obstruction.  Normal appendix.  Small volume abdominopelvic ascites.  No evidence of abdominal aortic aneurysm.  Status post hysterectomy.  No adnexal masses.  Bladder is within normal limits.  Degenerative changes of the lumbar spine.  IMPRESSION: 5.2 cm left supraclavicular nodal mass extending from the superior mediastinum, new/increased. Additional small mediastinal/ left axillary nodes.  Extensive upper abdominal/retroperitoneal lymphadenopathy, increased, as described above.  Multifocal hepatic metastases, new, with index lesions as described above.  Mild splenomegaly.  Overall appearance has significantly progressed from 2013.   Electronically Signed   By: Julian Hy M.D.   On: 10/09/2013 17:37    Scheduled Meds: . aspirin EC  81 mg Oral Daily  . atorvastatin  40 mg Oral QHS  . cholecalciferol  2,000 Units Oral Daily  . feeding supplement (ENSURE COMPLETE)  237 mL Oral BID BM  . lisinopril  5 mg Oral Daily  . loratadine  10 mg Oral Daily  . multivitamin with minerals  1 tablet Oral Daily  . potassium chloride SA  20 mEq Oral Daily  . sodium chloride  3 mL Intravenous Q12H  . zoledronic acid (ZOMETA) IV  4 mg Intravenous Once   Continuous Infusions: . sodium chloride 50 mL/hr at 10/09/13 2117    Principal Problem:   SIRS (systemic inflammatory response syndrome) Active Problems:   DUCTAL CARCINOMA IN SITU, RIGHT BREAST   HYPERLIPIDEMIA   Essential hypertension, benign    DIASTOLIC DYSFUNCTION   CLL (chronic lymphocytic leukemia)   Uterine cancer   Dehydration   Hypercalcemia   Malnutrition of moderate degree    Time spent: >30 minutes   Verneita Griffes, MD [facility] (P) [phone]

## 2013-10-10 NOTE — Progress Notes (Signed)
Physical Therapy Treatment Patient Details Name: Margaret Mann MRN: [id number] DOB: 1932-08-20 Today's Date: 10/10/2013    History of Present Illness 80 year old female with history of hypertension, 3 primary malignancies ( right breast ca, endometrial ca and CLL) currently not on any treatment, hypertension, hyperlipidemia, history of  diastolic dysfunction who was last known to be normal 5 days back was brought to the hospital by her sister-in-law she was found to be quite confused and dehydrated yesterday. When her sister-in-law visited patient last evening she will appear quite weak and dehydrated. She also had slowness of her speech. Patient reports feeling very weak for past 2-3 weeks with poor appetite    PT Comments    Pt OOB in recliner.  Assisted to BR then amb in hallway limited distance with one sitting rest break.  Pt c/o Mod SOB with amb.    Follow Up Recommendations  SNF [facility])     Equipment Recommendations       Recommendations for Other Services       Precautions / Restrictions Precautions Precautions: Fall Restrictions Weight Bearing Restrictions: No    Mobility  Bed Mobility               General bed mobility comments: Pt OOB in recliner  Transfers Overall transfer level: Needs assistance Equipment used: None;1 person hand held assist   Sit to Stand: Min assist         General transfer comment: cues for  hand placement, wt shift, safety; requires incr time to and hand over hand facilitation at times  Ambulation/Gait   Ambulation Distance (Feet): 110 Feet (55' x 2 one sitting rest break) Assistive device: 1 person hand held assist;None Gait Pattern/deviations: Step-through pattern;Decreased stride length Gait velocity: decr   General Gait Details: pt declined suggestion of using RW.  Amb hand held assist limited distance due to Mod c/o SOB   Stairs            Wheelchair Mobility    Modified Rankin (Stroke Patients Only)        Balance                                    Cognition                            Exercises      General Comments        Pertinent Vitals/Pain     Home Living                      Prior Function            PT Goals (current goals can now be found in the care plan section) Progress towards PT goals: Progressing toward goals    Frequency  Min 3X/week    PT Plan      Co-evaluation             End of Session Equipment Utilized During Treatment: Gait belt Activity Tolerance: Patient tolerated treatment well Patient left: in chair;with call bell/phone within reach;with chair alarm set;with family/visitor present     Time: 1135-1200 PT Time Calculation (min): 25 min  Charges:  $Gait Training: 8-22 mins $Therapeutic Activity: 8-22 mins                    G Codes:  Rica Koyanagi  PTA [facility]  Acute  Rehab Pager      [phone]

## 2013-10-10 NOTE — Progress Notes (Signed)
[place]  Telephone:([phone]    HOSPITAL PROGRESS NOTE I have seen the patient, examined her and edited the progress notes as follow:  Events since 4/1 noted.  Feels better this morning, less weak,  appetite is improving. Tolerating Ensure tid.  Her mental status improved. Physical Therapy assisting patient to increase ambulation. Denies fever, chills or night sweats. No myalgia. Had one episode of right nostril nosebleed, with quick resolution, she states that that is not uncommon as that side is always dry. No dysuria or hematuria. No blood in the stools.  No other events overnight. CT scan of the chest, abdomen and pelvis on 4/1 showed a  5.2 cm left supraclavicular nodal mass extending from the superior mediastinum, new or increased. Additional small mediastinal and left axillary nodes. Extensive upperabdominal/retroperitoneal lymphadenopathy, new multifocal hepatic metastases, mild splenomegaly, thus, showing disease progression. She will need bone marrow biopsy while hospitalized.  MEDICATIONS:  Scheduled Meds: . aspirin EC  81 mg Oral Daily  . atorvastatin  40 mg Oral QHS  . cholecalciferol  2,000 Units Oral Daily  . feeding supplement (ENSURE COMPLETE)  237 mL Oral BID BM  . lisinopril  5 mg Oral Daily  . loratadine  10 mg Oral Daily  . multivitamin with minerals  1 tablet Oral Daily  . potassium chloride SA  20 mEq Oral Daily  . sodium chloride  3 mL Intravenous Q12H  . zoledronic acid (ZOMETA) IV  4 mg Intravenous Once   Continuous Infusions: . sodium chloride 50 mL/hr at 10/09/13 2117   PRN Meds:.acetaminophen, acetaminophen, etodolac, fentaNYL, ondansetron (ZOFRAN) IV, ondansetron ALLERGIES:   Allergies  Allergen Reactions  . Dilaudid [Hydromorphone Hcl] Nausea And Vomiting     PHYSICAL EXAMINATION:   Filed Vitals:   10/10/13 0435  BP: 142/70  Pulse: 86  Temp: 98.3 F (36.8 C)  Resp: 26     Filed Weights   10/07/13 1556 10/10/13  0435  Weight: 144 lb 10 oz (65.6 kg) 143 lb 4.8 oz (22 kg)    80 year old  in no acute distress,   GENERAL:alert, no distress and comfortable. She looks thin but not cachectic  SKIN: skin color, texture, turgor are normal, no rashes or significant lesions . Few areas of ecchymoses on the left arm. EYES: normal, conjunctiva are pink and non-injected, sclera clear . Pupils equal.  OROPHARYNX:no exudate, no erythema and lips, buccal mucosa, and tongue normal  NECK: supple, thyroid normal size, non-tender, without nodularity  LYMPH: no palpable lymphadenopathy in the cervical, axillary or inguinal. Noted fullness in her left neck. LUNGS: clear to auscultation and percussion with normal breathing effort  HEART: regular rate & rhythm and no murmurs and no lower extremity edema  ABDOMEN:abdomen soft, non-tender and normal bowel sounds  Musculoskeletal:no cyanosis of digits and no clubbing  PSYCH: alert,, with fluent speech  NEURO: no focal motor/sensory deficits except for impaired hearing  LABORATORY/RADIOLOGY DATA:   Recent Labs Lab 10/07/13 1210 10/08/13 0350 10/09/13 0343  WBC 49.8* 39.7* 50.4*  HGB 12.6 10.7* 11.3*  HCT 38.7 33.2* 35.5*  PLT 174 138* 131*  MCV 87.8 88.3 89.0  MCH 28.6 28.5 28.3  MCHC 32.6 32.2 31.8  RDW 19.3* 19.6* 20.1*  LYMPHSABS 37.8* 29.4* 43.9*  MONOABS 2.0* 0.8 0.5  EOSABS 0.0 0.0 1.0*  BASOSABS 0.0 0.0 0.0    CMP    Recent Labs Lab 10/07/13 1210 10/08/13 0350 10/09/13 0343  NA 140 139 138  K 5.2 3.8 3.5*  CL 99 102 98  CO2 $Re'23 25 25  'fAy$ GLUCOSE 122* 83 97  BUN 45* 33* 16  CREATININE 1.05 0.89 0.69  CALCIUM 12.3* 10.5  10.1 10.1  AST 77* 74* 68*  ALT $Re'25 21 22  'dvj$ ALKPHOS 197* 184* 184*  BILITOT 1.6* 1.3* 1.2        Component Value Date/Time   BILITOT 1.2 10/09/2013 0343   BILITOT 0.56 09/02/2013 0930    Anemia panel:    Recent Labs  10/07/13 1210  VITAMINB12 >2000*     Recent Labs  10/07/13 1210  TSH 2.605      Liver  Function Tests:  Recent Labs Lab 10/07/13 1210 10/08/13 0350 10/09/13 0343  AST 77* 74* 68*  ALT $Re'25 21 22  'JHf$ ALKPHOS 197* 184* 184*  BILITOT 1.6* 1.3* 1.2  PROT 7.0 5.6* 6.1  ALBUMIN 3.5 2.7* 2.7*    CBG:  Recent Labs Lab 10/07/13 1203  GLUCAP 124*   Thyroid function studies  Recent Labs  10/07/13 1210  TSH 2.605    Radiology Studies: CT CHEST, ABDOMEN, AND PELVIS WITH CONTRAST 10/09/13 were reviewed myself  CT CHEST FINDINGS  3.2 x 5.2 cm left supraclavicular nodal mass arising from the left superior mediastinum/prevascular region .This appearance is new/markedly progressed from  2013. Additional small mediastinal and left axillary nodes, including a 10 mm subpectoral node  at the upper limits of normal. Small left and trace right pleural effusions. Mild dependent atelectasis in the left lower lobe. No suspicious pulmonary nodules. Mild centrilobular emphysematous changes with mild biapical pleural  parenchymal scarring. No pneumothorax. Visualized thyroid is unremarkable. The heart is normal in size. No pericardial effusion. Degenerative changes of the thoracic spine.   CT ABDOMEN AND PELVIS FINDINGS  Extensive upper abdominal/retroperitoneal lymphadenopathy, significantly progressed from 2013, including: --3.4 cm short axis portacaval node, --4.9 cm short axis aggregate left para-aortic nodal mass, --4.0 cm short axis left jejunal mesenteric nodal mass, --5.5 cm short axis right jejunal mesenteric nodal massSuspected multifocal hypoenhancing hepatic lesions throughout thhepatic lobe, --2.1 x 1.8 cm lesion in the caudate, --1.2 x 1.3 cm lesion in the medial segment left hepatic lobe-,-1.4 x 1.4 cm lesion in the anterior segment right hepatic lobe. Mild splenomegaly, measuring 13.9 cm in maximal craniocaudal dimension. Possible subcentimeter hypoenhancing lesion inferiorly  equivocal. Pancreas and adrenal glands are within normal limits. Gallbladder is notable for layering  gallstone, without associated inflammatory changes. Kidneys are notable for an 11 mm lateral left lower pole renal cyst. No hydronephrosis. No evidence of bowel obstruction. Normal appendix. Small volume abdominopelvic ascites. No evidence of abdominal aortic aneurysm. Status post hysterectomy. No adnexal masses. Bladder is within normal limits. Degenerative changes of the lumbar spine. IMPRESSION: 5.2 cm left supraclavicular nodal mass extending from the superior mediastinum, new/increased. Additional small mediastinal/ left axillary nodes. Extensive upperabdominal/retroperitoneal lymphadenopathy, increased, as described above. Multifocal hepatic metastases, new, with index lesions as described above. Mild splenomegaly. Overall appearance has significantly progressed from 2013.  #1 CLL  The pattern of persistent leukocytosis this compatible with disease progression.  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  CBC today is still not available.   CBC from 10/09/13 showed WBC of  50.4 from 39.7. On admission WBC was 49.8. CT scans demonstrate progression of the diease.  Overall, the patient has very poor prognosis as her performance status is poor. I had a long discussion with the patient and her next of kin, sister, Kyra Searles. If the patient desire further treatment, the best available  treatment for her condition would be Ibrutinib. This medication is very expensive and require preauthorization.  To bridge her until therapy start date, the patient will need to be started on corticosteroids therapy. She would also need extensive followup in the future once we start her on chemotherapy due to high-risk complications. The patient is made aware that chemotherapy will not cure her disease but may prolong her life. If the patient does not want further treatment, then I recommend referral to hospice care. Without treatment, her prognosis would be poor, likely to be measured to less than 3 months.   #2 hypercalcemia   Likely malignant.Corrected Ca levels from 4/1 were 11.4  from 12.3 on admission. No new labs today. She received one dose of zoledronic acid on 4/1. Continue  Hydration . PTH level is low and this is compatible with malignancy. Will monitor.   #3 weakness and unspecified weight loss  Appreciate Nutrition evaluation. Tolerating Ensure tid well. No appetite stimulants needed   #4 acute renal failure   resolved with aggressive fluid resuscitation, likely due to recent hypercalcemia  #5 elevated AST and total bilirubin  This is related to hepatic metastasis. It is unusual to see hepatic metastasis from CLL. In the scope of things, I would not recommend liver biopsy right now.   #6 recent fall   Appreciate Physical Therapy to assess her strength while hospitalized. She is to be discharged to a skilled nursing facility as she will need 24 hr assistance or hospice depending on her decision about treatment.   #7 history of DCIS  Recent mammography and biopsy confirmed no evidence of disease recurrence   #8 history of uterine cancer  Clinically, she has no evidence of disease recurrence.   #9 Abnormal MRI brain Continue observation. Due for another MRI in 6-8 weeks.Likely related to recent fall. Patient had an episode of nosebleed this morning, which she said is been her current prior to her admission. She states it only occurs on the right nostril. Her platelet count is 131 as of 10/09/2013. Consider following up for potential bleeding, as  acute/subacute hemorrhage was noted on prior film on 3/31.   Overall, I spent almost an hour today discussing about her care with her and her family.  Rondel Jumbo, PA-C 10/10/2013, 7:28 AM Alvy Bimler, Arav Bannister, MD 10/10/2013

## 2013-10-11 ENCOUNTER — Other Ambulatory Visit: Payer: Self-pay | Admitting: Hematology and Oncology

## 2013-10-11 DIAGNOSIS — E883 Tumor lysis syndrome: Secondary | ICD-10-CM | POA: Insufficient documentation

## 2013-10-11 DIAGNOSIS — C787 Secondary malignant neoplasm of liver and intrahepatic bile duct: Secondary | ICD-10-CM

## 2013-10-11 LAB — HEPATITIS B SURFACE ANTIBODY,QUALITATIVE: Hep B S Ab: NEGATIVE

## 2013-10-11 LAB — HEPATITIS B SURFACE ANTIGEN: HEP B S AG: NEGATIVE

## 2013-10-11 LAB — HEPATITIS B CORE ANTIBODY, IGM: HEP B C IGM: NONREACTIVE

## 2013-10-11 LAB — URIC ACID: Uric Acid, Serum: 5.3 mg/dL (ref 2.4–7.0)

## 2013-10-11 MED ORDER — SODIUM CHLORIDE 0.9 % IV SOLN
6.0000 mg | Freq: Once | INTRAVENOUS | Status: AC
  Administered 2013-10-11: 6 mg via INTRAVENOUS
  Filled 2013-10-11: qty 4

## 2013-10-11 MED ORDER — METHYLPREDNISOLONE SODIUM SUCC 125 MG IJ SOLR
125.0000 mg | Freq: Every day | INTRAMUSCULAR | Status: DC
  Administered 2013-10-11 – 2013-10-15 (×5): 125 mg via INTRAVENOUS
  Filled 2013-10-11 (×5): qty 2

## 2013-10-11 MED ORDER — ALLOPURINOL 150 MG HALF TABLET
150.0000 mg | ORAL_TABLET | Freq: Every day | ORAL | Status: DC
  Administered 2013-10-12 – 2013-10-14 (×3): 150 mg via ORAL
  Filled 2013-10-11 (×3): qty 1

## 2013-10-11 MED ORDER — ACYCLOVIR 200 MG PO CAPS
400.0000 mg | ORAL_CAPSULE | Freq: Every day | ORAL | Status: DC
Start: 2013-10-11 — End: 2013-10-15
  Administered 2013-10-11 – 2013-10-15 (×5): 400 mg via ORAL
  Filled 2013-10-11 (×5): qty 2

## 2013-10-11 MED ORDER — SALINE SPRAY 0.65 % NA SOLN
1.0000 | NASAL | Status: DC | PRN
  Filled 2013-10-11: qty 44

## 2013-10-11 NOTE — Progress Notes (Signed)
Patient's sister and brother in-law has so many questions regarding her chemotherapy planned for [facility] like a conference with Dr Rennie Plowman making a decision .Please follow up.

## 2013-10-11 NOTE — Progress Notes (Signed)
Pt left at this time transferring to room 1316.  Alert, oriented, and aware of transfer.

## 2013-10-11 NOTE — Progress Notes (Signed)
[facility] PROGRESS NOTE  DEJON JUNGMAN OHY:[id number] DOB: November 06, 1932 DOA: 10/07/2013 PCP: Osborne Casco, MD  80 y/o ?, known h/o R breast Ca DCIS s/p R mastectomy + XRt completed rx 09/05/06, h/o IA endometrial cancer status post TAH and BSO on 09/02/2005 status post adjuvant chemotherapy and brachytherapy. Completed treatments July 2010, h/o CLL with 17 P  Likely P 53 related--Rx 6 cycles Bendamustine + Rituxan admitted with falls weakness and confusion Found to have Persistent leukocytosis consistent probably with High risk CLL Scans were perfomred and it was noted that she  Was found to have new 5.2 cm L supraclavicular NOdal mass in sup mediastinum, New Multifocal hepatic mets with significant overall disease progression Patient ultimately decided after long discussions with oncology to undergo treatment knowing fully well that may be a life limiting process    Assessment/Plan: SIRS (systemic inflammatory response syndrome), resolved -WBC's ? likely due to CLL -tachycardia resolved  -Will complete in-hospital workup of malignancy including CT scans  Acute encephalopathy resolved -due to hypercalcemia, most likely  Cavernous hemangioma with small bleeding-moderate thrombocytopenia -hold heparin products  -SCDs ordered -  Hypercalcemia , corrected calcium = still 11.14 -Secondary to malignancy but was on supplements as well caclicium carbonate -TSH WNL -PTH LESS than 2.5, calcium 10.1-potentially some contribution from hydrochlorothiazide, unlikely vitamin D excess/milk alkali syndrome -to receive Zometa 4/2-follow calcium daily -Will consider nasogastric and then however it tachyphylaxis prevents this from being a long-term solution.  AKI  -Secondary to dehydration and continue use of diuretics -d/c diuretics -Aggressive hydration  DCIS right breast since 2007  -strongly ER/PR positive status post lumpectomy 07/12/2006. Postop radiation followed by  tamoxifen. Right axillary lymph node enlargement found at that time with biopsy showing CLL versus WDLL .  -continue tamoxifen. Recent images studies for follow up demonstarted stability  Endometrial cancer stage IA, grade 3, mixed endometrioid and clear cell ,  -diagnosed January 2010 status post robotic hysterectomy with BSO 09/02/2008 followed by postoperative adjuvant chemotherapy with carboplatinum plus Taxol then brachytherapy to the vaginal vault between May 13 and 11/27/08. -appears to be stable and in remission    CLL initial stage I , 2013  -17 chromosome abnormality on marrow biopsy. Treatment with 6 cycles of Bendamustine plus Rituxan between 05/03/2012 and 09/21/2012 with a complete response.  -Last WBC approx 1 month ago 13,000  ???44,000 -Patient at high risk for tumor lysis syndrome -rasburicase ordered, Solu-Medrol 125 ordered daily, monitor closely phosphorus uric acid LDH and other labs -Aggressive hydration  IV fluid rate after 100 cc per hour 4/3 -Oncologist to detailed discussions with family about options for treatment which may include Ibrutinib-in the interim Cleotis Lema has been ordered to start 4/6 and patient has been transferred to the oncology floor  Hepatic metastases of unknown etiology -Defer workup to oncologist -LFTs elevated but trending down  Essential hypertension, benign  Continue lisinopril.  Stable. -continue holding HCTZ due to dehydration, tendency to cause hypercalcemia  HYPERLIPIDEMIA  continue statin   Chronic compensated diastolic dysfunctionn: Last echo with EF WNL (60%) -continue holding diuretics -continue IVF's -strict I's and O's and daily weight.  Moderate protein calorie malnutrition -continue feeding supplements BID -nutrition following patient  Physical deconditioning -PT recommending SNF if unable to 24/7 supervision assistance unable to be provided at discharge -will follow clinical response   Code Status: Full Family  Communication: Discussed at bedside with Abernethy,Robert  He will update Disposition Plan: to be determine   Consultants:  Oncology service  Procedures:  See below for x-ray reports  Antibiotics:  None   HPI/Subjective:  A lot more oriented and pleasant. Ambulated this afternoon. Tolerating diet. States she has dry eyes. Concerned about her nosebleeds. No large volume however Appetite seems to to have improved     Objective: Filed Vitals:   10/11/13 1141  BP: 131/69  Pulse: 91  Temp: 98.8 F (37.1 C)  Resp: 18    Intake/Output Summary (Last 24 hours) at 10/11/13 1631 Last data filed at 10/11/13 1400  Gross per 24 hour  Intake   2045 ml  Output   2125 ml  Net    -80 ml   Filed Weights   10/10/13 0435 10/11/13 0507 10/11/13 1141  Weight: 65 kg (143 lb 4.8 oz) 65.6 kg (144 lb 10 oz) 66.7 kg (147 lb 0.8 oz)    Exam:   General:  Afebrile, feeling better. Much more clear  Cardiovascular: regular rate, no rubs or gallops  Respiratory: CTA bilaterally  Abdomen: soft, NT, ND, positive BS  Musculoskeletal: no edema, no cyanosis  Data Reviewed: Basic Metabolic Panel:  Recent Labs Lab 10/07/13 1210 10/08/13 0350 10/09/13 0343 10/10/13 1100  NA 140 139 138 135*  K 5.2 3.8 3.5* 3.5*  CL 99 102 98 95*  CO2 23 25 25 25   GLUCOSE 122* 83 97 97  BUN 45* 33* 16 9  CREATININE 1.05 0.89 0.69 0.65  CALCIUM 12.3* 10.5  10.1 10.1 10.1   Liver Function Tests:  Recent Labs Lab 10/07/13 1210 10/08/13 0350 10/09/13 0343 10/10/13 1100  AST 77* 74* 68* 70*  ALT 25 21 22 25   ALKPHOS 197* 184* 184* 202*  BILITOT 1.6* 1.3* 1.2 1.1  PROT 7.0 5.6* 6.1 6.5  ALBUMIN 3.5 2.7* 2.7* 2.7*   CBC:  Recent Labs Lab 10/07/13 1210 10/08/13 0350 10/09/13 0343 10/10/13 0820  WBC 49.8* 39.7* 50.4* 44.4*  NEUTROABS 10.0* 9.5* 5.0 8.4*  HGB 12.6 10.7* 11.3* 11.1*  HCT 38.7 33.2* 35.5* 34.2*  MCV 87.8 88.3 89.0 88.4  PLT 174 138* 131* 129*   CBG:  Recent  Labs Lab 10/07/13 1203  GLUCAP 124*    Recent Results (from the past 240 hour(s))  CULTURE, BLOOD (ROUTINE X 2)     Status: None   Collection Time    10/07/13  4:25 PM      Result Value Ref Range Status   Specimen Description BLOOD LEFT HAND   Final   Special Requests BOTTLES DRAWN AEROBIC AND ANAEROBIC 10CC   Final   Culture  Setup Time     Final   Value: 10/07/2013 19:07     Performed at [facility]   Culture     Final   Value:        BLOOD CULTURE RECEIVED NO GROWTH TO DATE CULTURE WILL BE HELD FOR 5 DAYS BEFORE ISSUING A FINAL NEGATIVE REPORT     Performed at [facility]   Report Status PENDING   Incomplete  CULTURE, BLOOD (ROUTINE X 2)     Status: None   Collection Time    10/07/13  4:33 PM      Result Value Ref Range Status   Specimen Description BLOOD LEFT ARM   Final   Special Requests BOTTLES DRAWN AEROBIC AND ANAEROBIC 10CC   Final   Culture  Setup Time     Final   Value: 10/07/2013 19:07     Performed at [facility]  Final   Value:        BLOOD CULTURE RECEIVED NO GROWTH TO DATE CULTURE WILL BE HELD FOR 5 DAYS BEFORE ISSUING A FINAL NEGATIVE REPORT     Performed at [facility]   Report Status PENDING   Incomplete     Studies: Ct Chest W Contrast  10/09/2013   CLINICAL DATA:  Staging CLL  EXAM: CT CHEST, ABDOMEN, AND PELVIS WITH CONTRAST  TECHNIQUE: Multidetector CT imaging of the chest, abdomen and pelvis was performed following the standard protocol during bolus administration of intravenous contrast.  CONTRAST:  184mL OMNIPAQUE IOHEXOL 300 MG/ML  SOLN  COMPARISON:  07/05/2012  FINDINGS: CT CHEST FINDINGS  3.2 x 5.2 cm left supraclavicular nodal mass (series 2/image 5), arising from the left superior mediastinum/prevascular region (series 2/image 14). This appearance is new/markedly progressed from 2013.  Additional small mediastinal and left axillary nodes, including a 10 mm subpectoral node (series 2/image 14), at the  upper limits of normal.  Small left and trace right pleural effusions. Mild dependent atelectasis in the left lower lobe. No suspicious pulmonary nodules. Mild centrilobular emphysematous changes with mild biapical pleural parenchymal scarring. No pneumothorax.  Visualized thyroid is unremarkable.  The heart is normal in size.  No pericardial effusion.  Degenerative changes of the thoracic spine.  CT ABDOMEN AND PELVIS FINDINGS  Extensive upper abdominal/retroperitoneal lymphadenopathy, significantly progressed from 2013, including:  --3.4 cm short axis portacaval node (series 2/ image 60)  --4.9 cm short axis aggregate left para-aortic nodal mass (series 2/ image 66)  --4.0 cm short axis left jejunal mesenteric nodal mass (series 2/ image 80)  --5.5 cm short axis right jejunal mesenteric nodal mass (series 2/image 80)  Suspected multifocal hypoenhancing hepatic lesions throughout the liver, including:  --1.5 x 1.5 cm lesion centrally in the anterior segment right hepatic lobe (series 2/image 47)  --2.1 x 1.8 cm lesion in the caudate (series 2/image 50)  --1.2 x 1.3 cm lesion in the medial segment left hepatic lobe (series 2/image 51)  --1.4 x 1.4 cm lesion in the anterior segment right hepatic lobe (series 2/image 59)  Mild splenomegaly, measuring 13.9 cm in maximal craniocaudal dimension. Possible subcentimeter hypoenhancing lesion inferiorly (series 2/image 57), equivocal.  Pancreas and adrenal glands are within normal limits.  Gallbladder is notable for layering gallstone (series 2/image 67), without associated inflammatory changes.  Kidneys are notable for an 11 mm lateral left lower pole renal cyst (series 4/image 39). No hydronephrosis.  No evidence of bowel obstruction.  Normal appendix.  Small volume abdominopelvic ascites.  No evidence of abdominal aortic aneurysm.  Status post hysterectomy.  No adnexal masses.  Bladder is within normal limits.  Degenerative changes of the lumbar spine.  IMPRESSION: 5.2 cm  left supraclavicular nodal mass extending from the superior mediastinum, new/increased. Additional small mediastinal/ left axillary nodes.  Extensive upper abdominal/retroperitoneal lymphadenopathy, increased, as described above.  Multifocal hepatic metastases, new, with index lesions as described above.  Mild splenomegaly.  Overall appearance has significantly progressed from 2013.   Electronically Signed   By: Julian Hy M.D.   On: 10/09/2013 17:37   Ct Abdomen Pelvis W Contrast  10/09/2013   CLINICAL DATA:  Staging CLL  EXAM: CT CHEST, ABDOMEN, AND PELVIS WITH CONTRAST  TECHNIQUE: Multidetector CT imaging of the chest, abdomen and pelvis was performed following the standard protocol during bolus administration of intravenous contrast.  CONTRAST:  11mL OMNIPAQUE IOHEXOL 300 MG/ML  SOLN  COMPARISON:  07/05/2012  FINDINGS: CT  CHEST FINDINGS  3.2 x 5.2 cm left supraclavicular nodal mass (series 2/image 5), arising from the left superior mediastinum/prevascular region (series 2/image 14). This appearance is new/markedly progressed from 2013.  Additional small mediastinal and left axillary nodes, including a 10 mm subpectoral node (series 2/image 14), at the upper limits of normal.  Small left and trace right pleural effusions. Mild dependent atelectasis in the left lower lobe. No suspicious pulmonary nodules. Mild centrilobular emphysematous changes with mild biapical pleural parenchymal scarring. No pneumothorax.  Visualized thyroid is unremarkable.  The heart is normal in size.  No pericardial effusion.  Degenerative changes of the thoracic spine.  CT ABDOMEN AND PELVIS FINDINGS  Extensive upper abdominal/retroperitoneal lymphadenopathy, significantly progressed from 2013, including:  --3.4 cm short axis portacaval node (series 2/ image 60)  --4.9 cm short axis aggregate left para-aortic nodal mass (series 2/ image 66)  --4.0 cm short axis left jejunal mesenteric nodal mass (series 2/ image 80)  --5.5 cm  short axis right jejunal mesenteric nodal mass (series 2/image 80)  Suspected multifocal hypoenhancing hepatic lesions throughout the liver, including:  --1.5 x 1.5 cm lesion centrally in the anterior segment right hepatic lobe (series 2/image 47)  --2.1 x 1.8 cm lesion in the caudate (series 2/image 50)  --1.2 x 1.3 cm lesion in the medial segment left hepatic lobe (series 2/image 51)  --1.4 x 1.4 cm lesion in the anterior segment right hepatic lobe (series 2/image 59)  Mild splenomegaly, measuring 13.9 cm in maximal craniocaudal dimension. Possible subcentimeter hypoenhancing lesion inferiorly (series 2/image 57), equivocal.  Pancreas and adrenal glands are within normal limits.  Gallbladder is notable for layering gallstone (series 2/image 67), without associated inflammatory changes.  Kidneys are notable for an 11 mm lateral left lower pole renal cyst (series 4/image 39). No hydronephrosis.  No evidence of bowel obstruction.  Normal appendix.  Small volume abdominopelvic ascites.  No evidence of abdominal aortic aneurysm.  Status post hysterectomy.  No adnexal masses.  Bladder is within normal limits.  Degenerative changes of the lumbar spine.  IMPRESSION: 5.2 cm left supraclavicular nodal mass extending from the superior mediastinum, new/increased. Additional small mediastinal/ left axillary nodes.  Extensive upper abdominal/retroperitoneal lymphadenopathy, increased, as described above.  Multifocal hepatic metastases, new, with index lesions as described above.  Mild splenomegaly.  Overall appearance has significantly progressed from 2013.   Electronically Signed   By: Julian Hy M.D.   On: 10/09/2013 17:37    Scheduled Meds: . acyclovir  400 mg Oral Daily  . [START ON 10/12/2013] allopurinol  150 mg Oral Daily  . aspirin EC  81 mg Oral Daily  . atorvastatin  40 mg Oral QHS  . cholecalciferol  2,000 Units Oral Daily  . feeding supplement (ENSURE COMPLETE)  237 mL Oral BID BM  . lisinopril  5 mg  Oral Daily  . loratadine  10 mg Oral Daily  . methylPREDNISolone (SOLU-MEDROL) injection  125 mg Intravenous Daily  . multivitamin with minerals  1 tablet Oral Daily  . potassium chloride SA  20 mEq Oral Daily  . sodium chloride  3 mL Intravenous Q12H   Continuous Infusions: . sodium chloride 75 mL/hr at 10/11/13 1028    Principal Problem:   SIRS (systemic inflammatory response syndrome) Active Problems:   DUCTAL CARCINOMA IN SITU, RIGHT BREAST   HYPERLIPIDEMIA   Essential hypertension, benign   DIASTOLIC DYSFUNCTION   CLL (chronic lymphocytic leukemia)   Uterine cancer   Dehydration   Hypercalcemia   Malnutrition  of moderate degree    Time spent: >30 minutes   Verneita Griffes, MD [facility] [phone]

## 2013-10-11 NOTE — Progress Notes (Signed)
Patient scanned for PICC in left arm.  All veins measured greater than 50% occupancy by PICC.  Patient with mastectomy and lymph node removal on right.  Scanned without tourniquet to see if any veins suitable if MD wanted to use this arm.  Basilic vein was largest with a 29% occupancy.  Spoke with Dr. Verlon Au regarding findings.  Dr. Verlon Au states that he will consult radiology for [facility] placement.  Larence Thone, Nicolette Bang, RN VAST.

## 2013-10-11 NOTE — Progress Notes (Signed)
Writer gave report to 3rd floor nurse at this time. Pt to be transferred to room 1316.

## 2013-10-11 NOTE — Progress Notes (Signed)
Chaplain offered ministry of presence, emotional and spiritual support. Chaplain will follow up as needed or requested.   10/11/13 1600  Clinical Encounter Type  Visited With Patient  Visit Type Initial

## 2013-10-11 NOTE — Progress Notes (Signed)
Margaret Mann   DOB:1932/11/17   DZ#:[id number]    Subjective: The patient is still complaining of feeling weak. Otherwise overall, she felt better. Denies any fevers or chills. Objective:  Filed Vitals:   10/11/13 0507  BP: 152/79  Pulse: 108  Temp: 97.9 F (36.6 C)  Resp: 22     Intake/Output Summary (Last 24 hours) at 10/11/13 0831 Last data filed at 10/11/13 0600  Gross per 24 hour  Intake   1700 ml  Output   2475 ml  Net   -775 ml    GENERAL:alert, no distress and comfortable SKIN: skin color, texture, turgor are normal, no rashes or significant lesions EYES: normal, Conjunctiva are pink and non-injected, sclera clear OROPHARYNX:no exudate, no erythema and lips, buccal mucosa, and tongue normal  LUNGS: clear to auscultation and percussion with normal breathing effort HEART: regular rate & rhythm and no murmurs and no lower extremity edema ABDOMEN:abdomen soft, non-tender and normal bowel sounds Musculoskeletal:no cyanosis of digits and no clubbing  NEURO: alert & oriented x 3 with fluent speech, no focal motor/sensory deficits   Labs:  Lab Results  Component Value Date   WBC 44.4* 10/10/2013   HGB 11.1* 10/10/2013   HCT 34.2* 10/10/2013   MCV 88.4 10/10/2013   PLT 129* 10/10/2013   NEUTROABS 8.4* 10/10/2013    Lab Results  Component Value Date   NA 135* 10/10/2013   K 3.5* 10/10/2013   CL 95* 10/10/2013   CO2 25 10/10/2013    Studies:  Ct Chest W Contrast  10/09/2013   CLINICAL DATA:  Staging CLL  EXAM: CT CHEST, ABDOMEN, AND PELVIS WITH CONTRAST  TECHNIQUE: Multidetector CT imaging of the chest, abdomen and pelvis was performed following the standard protocol during bolus administration of intravenous contrast.  CONTRAST:  113mL OMNIPAQUE IOHEXOL 300 MG/ML  SOLN  COMPARISON:  07/05/2012  FINDINGS: CT CHEST FINDINGS  3.2 x 5.2 cm left supraclavicular nodal mass (series 2/image 5), arising from the left superior mediastinum/prevascular region (series 2/image 14). This appearance  is new/markedly progressed from 2013.  Additional small mediastinal and left axillary nodes, including a 10 mm subpectoral node (series 2/image 14), at the upper limits of normal.  Small left and trace right pleural effusions. Mild dependent atelectasis in the left lower lobe. No suspicious pulmonary nodules. Mild centrilobular emphysematous changes with mild biapical pleural parenchymal scarring. No pneumothorax.  Visualized thyroid is unremarkable.  The heart is normal in size.  No pericardial effusion.  Degenerative changes of the thoracic spine.  CT ABDOMEN AND PELVIS FINDINGS  Extensive upper abdominal/retroperitoneal lymphadenopathy, significantly progressed from 2013, including:  --3.4 cm short axis portacaval node (series 2/ image 60)  --4.9 cm short axis aggregate left para-aortic nodal mass (series 2/ image 66)  --4.0 cm short axis left jejunal mesenteric nodal mass (series 2/ image 80)  --5.5 cm short axis right jejunal mesenteric nodal mass (series 2/image 80)  Suspected multifocal hypoenhancing hepatic lesions throughout the liver, including:  --1.5 x 1.5 cm lesion centrally in the anterior segment right hepatic lobe (series 2/image 47)  --2.1 x 1.8 cm lesion in the caudate (series 2/image 50)  --1.2 x 1.3 cm lesion in the medial segment left hepatic lobe (series 2/image 51)  --1.4 x 1.4 cm lesion in the anterior segment right hepatic lobe (series 2/image 59)  Mild splenomegaly, measuring 13.9 cm in maximal craniocaudal dimension. Possible subcentimeter hypoenhancing lesion inferiorly (series 2/image 57), equivocal.  Pancreas and adrenal glands are within normal limits.  Gallbladder is notable for layering gallstone (series 2/image 67), without associated inflammatory changes.  Kidneys are notable for an 11 mm lateral left lower pole renal cyst (series 4/image 39). No hydronephrosis.  No evidence of bowel obstruction.  Normal appendix.  Small volume abdominopelvic ascites.  No evidence of abdominal  aortic aneurysm.  Status post hysterectomy.  No adnexal masses.  Bladder is within normal limits.  Degenerative changes of the lumbar spine.  IMPRESSION: 5.2 cm left supraclavicular nodal mass extending from the superior mediastinum, new/increased. Additional small mediastinal/ left axillary nodes.  Extensive upper abdominal/retroperitoneal lymphadenopathy, increased, as described above.  Multifocal hepatic metastases, new, with index lesions as described above.  Mild splenomegaly.  Overall appearance has significantly progressed from 2013.   Electronically Signed   By: Julian Hy M.D.   On: 10/09/2013 17:37   Ct Abdomen Pelvis W Contrast  10/09/2013   CLINICAL DATA:  Staging CLL  EXAM: CT CHEST, ABDOMEN, AND PELVIS WITH CONTRAST  TECHNIQUE: Multidetector CT imaging of the chest, abdomen and pelvis was performed following the standard protocol during bolus administration of intravenous contrast.  CONTRAST:  173mL OMNIPAQUE IOHEXOL 300 MG/ML  SOLN  COMPARISON:  07/05/2012  FINDINGS: CT CHEST FINDINGS  3.2 x 5.2 cm left supraclavicular nodal mass (series 2/image 5), arising from the left superior mediastinum/prevascular region (series 2/image 14). This appearance is new/markedly progressed from 2013.  Additional small mediastinal and left axillary nodes, including a 10 mm subpectoral node (series 2/image 14), at the upper limits of normal.  Small left and trace right pleural effusions. Mild dependent atelectasis in the left lower lobe. No suspicious pulmonary nodules. Mild centrilobular emphysematous changes with mild biapical pleural parenchymal scarring. No pneumothorax.  Visualized thyroid is unremarkable.  The heart is normal in size.  No pericardial effusion.  Degenerative changes of the thoracic spine.  CT ABDOMEN AND PELVIS FINDINGS  Extensive upper abdominal/retroperitoneal lymphadenopathy, significantly progressed from 2013, including:  --3.4 cm short axis portacaval node (series 2/ image 60)  --4.9  cm short axis aggregate left para-aortic nodal mass (series 2/ image 66)  --4.0 cm short axis left jejunal mesenteric nodal mass (series 2/ image 80)  --5.5 cm short axis right jejunal mesenteric nodal mass (series 2/image 80)  Suspected multifocal hypoenhancing hepatic lesions throughout the liver, including:  --1.5 x 1.5 cm lesion centrally in the anterior segment right hepatic lobe (series 2/image 47)  --2.1 x 1.8 cm lesion in the caudate (series 2/image 50)  --1.2 x 1.3 cm lesion in the medial segment left hepatic lobe (series 2/image 51)  --1.4 x 1.4 cm lesion in the anterior segment right hepatic lobe (series 2/image 59)  Mild splenomegaly, measuring 13.9 cm in maximal craniocaudal dimension. Possible subcentimeter hypoenhancing lesion inferiorly (series 2/image 57), equivocal.  Pancreas and adrenal glands are within normal limits.  Gallbladder is notable for layering gallstone (series 2/image 67), without associated inflammatory changes.  Kidneys are notable for an 11 mm lateral left lower pole renal cyst (series 4/image 39). No hydronephrosis.  No evidence of bowel obstruction.  Normal appendix.  Small volume abdominopelvic ascites.  No evidence of abdominal aortic aneurysm.  Status post hysterectomy.  No adnexal masses.  Bladder is within normal limits.  Degenerative changes of the lumbar spine.  IMPRESSION: 5.2 cm left supraclavicular nodal mass extending from the superior mediastinum, new/increased. Additional small mediastinal/ left axillary nodes.  Extensive upper abdominal/retroperitoneal lymphadenopathy, increased, as described above.  Multifocal hepatic metastases, new, with index lesions as described above.  Mild splenomegaly.  Overall appearance has significantly progressed from 2013.   Electronically Signed   By: Julian Hy M.D.   On: 10/09/2013 17:37    Assessment & Plan:  #1 CLL with bulky lymphadenopathy The pattern of persistent leukocytosis and the abnormal CT scan are compatible  with disease progression.  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  Overall, the patient has very poor prognosis as her performance status is poor. I had a long discussion with the patient and her next of kin, sister, Kyra Searles. Her sister will be coming in today for further discussion. The patient appeared to be interested to pursue additional therapy. I will wait until her sister is here to consent her for treatment. It will take a while to get Ibrutinib approved and delivered. In the meantime, I am planning to start her on Ofatumumab on Monday 4/6. I will continue daily IVF and along with high-dose steroids starting today. Due to high risk tumor lysis syndrome, I will check uric acid and LDH daily and will start her on rasburicase.  #2 hypercalcemia  Likely malignant.Corrected Ca levels from 4/1 were 11.4  from 12.3 on admission. She received one dose of zoledronic acid on 4/1. Continue  Hydration . PTH level is low and this is compatible with malignancy. Will monitor.   #3 weakness and unspecified weight loss  Appreciate Nutrition evaluation. Tolerating Ensure tid well. No appetite stimulants needed   #4 acute renal failure, resolved  resolved with aggressive fluid resuscitation, likely due to recent hypercalcemia  #5 liver metastasis with abnormal liver function test This is related to hepatic metastasis. It is unusual to see hepatic metastasis from CLL, though not impossible. In the scope of things, I would not recommend liver biopsy right now.   #6 recent fall   Appreciate Physical Therapy to assess her strength while hospitalized. She is to be discharged to a skilled nursing facility as she will need 24 hr assistance. The earliest she can be discharged would be next Tuesday on 10/15/2013.  #7 history of DCIS  Recent mammography and biopsy confirmed no evidence of disease recurrence   #8 history of uterine cancer  Clinically, she has no evidence of disease recurrence.   #9 Abnormal  MRI brain Continue observation. Due for another MRI in 6-8 weeks.Likely related to recent fall. Patient had an episode of nosebleed this morning, which she said is been her current prior to her admission. She states it only occurs on the right nostril. Her platelet count is 131 as of 10/09/2013. Consider following up for potential bleeding, as  acute/subacute hemorrhage was noted on prior film on 3/31.   The plan of care is discussed with the hospitalist. I recommend moving her to the third floor to begin systemic treatment in the near future. [place], [place], MD 10/11/2013  8:31 AM

## 2013-10-11 NOTE — Progress Notes (Signed)
Physical Therapy Treatment Patient Details Name: Margaret Mann MRN: [id number] DOB: 01/30/1933 Today's Date: 10/11/2013    History of Present Illness 80 year old female with history of hypertension, 3 primary malignancies ( right breast ca, endometrial ca and CLL) currently not on any treatment, hypertension, hyperlipidemia, history of  diastolic dysfunction who was last known to be normal 5 days back was brought to the hospital by her sister-in-law she was found to be quite confused and dehydrated yesterday. When her sister-in-law visited patient last evening she will appear quite weak and dehydrated. She also had slowness of her speech. Patient reports feeling very weak for past 2-3 weeks with poor appetite    PT Comments    Assisted pt OOB to amb in hallway then amb in hallway.  Pt required one long sitting rest break due to mild c/o dyspnea.  Assisted back to room and to BR then to recliner.  Follow Up Recommendations  SNF [facility])     Equipment Recommendations       Recommendations for Other Services       Precautions / Restrictions Precautions Precautions: Fall Restrictions Weight Bearing Restrictions: No    Mobility  Bed Mobility Overal bed mobility: Needs Assistance Bed Mobility: Supine to Sit;Sit to Supine     Supine to sit: Min assist Sit to supine: Min assist   General bed mobility comments: increased time  Transfers Overall transfer level: Needs assistance Equipment used: None;1 person hand held assist Transfers: Sit to/from Stand Sit to Stand: Min assist         General transfer comment: cues for  hand placement, wt shift, safety; requires incr time to and hand over hand facilitation at times  Ambulation/Gait Ambulation/Gait assistance: Min guard;Min assist Ambulation Distance (Feet): 25 Feet Assistive device: 1 person hand held assist;None Gait Pattern/deviations: Step-through pattern;Decreased stride length Gait velocity: decr   General Gait  Details: pt declined suggestion of using RW.  Amb hand held assist limited distance due to Mod c/o SOB  Required one sitting rest break.    Stairs            Wheelchair Mobility    Modified Rankin (Stroke Patients Only)       Balance                                    Cognition                            Exercises      General Comments        Pertinent Vitals/Pain     Home Living                      Prior Function            PT Goals (current goals can now be found in the care plan section) Progress towards PT goals: Progressing toward goals    Frequency       PT Plan      Co-evaluation             End of Session Equipment Utilized During Treatment: Gait belt Activity Tolerance: Patient tolerated treatment well Patient left: in chair;with call bell/phone within reach;with chair alarm set;with family/visitor present     Time: 1245-1310 PT Time Calculation (min): 25 min  Charges:  $Gait Training: 23-37 mins  G Codes:      Rica Koyanagi  PTA [facility]  Acute  Rehab Pager      [phone]

## 2013-10-11 NOTE — Progress Notes (Signed)
In preparation for her treatment on Monday, I have ordered a hepatitis B panel, uric acid and LDH. She will be started on allopurinol and uric acid today. I have discussed with the nursing staff on [facility] to expect her transfer and alerted inpatient pharmacist to make sure all the drugs needed are available. I've also discussed with the hospitalist to monitor for tumor lysis syndrome over the weekend. I will get one of my colleagues to follow her this weekend as well.

## 2013-10-11 NOTE — Progress Notes (Signed)
CSW continues to follow for discharge planning. Has a bed @ [facility] when ready. Anticipating discharge early next week.   CSW will follow-up Monday.   Raynaldo Opitz, [facility] Clinical Social Worker cell #: [phone]

## 2013-10-12 DIAGNOSIS — C9112 Chronic lymphocytic leukemia of B-cell type in relapse: Secondary | ICD-10-CM

## 2013-10-12 DIAGNOSIS — C549 Malignant neoplasm of corpus uteri, unspecified: Secondary | ICD-10-CM

## 2013-10-12 LAB — COMPREHENSIVE METABOLIC PANEL
ALT: 24 U/L (ref 0–35)
AST: 52 U/L — AB (ref 0–37)
Albumin: 2.5 g/dL — ABNORMAL LOW (ref 3.5–5.2)
Alkaline Phosphatase: 183 U/L — ABNORMAL HIGH (ref 39–117)
BILIRUBIN TOTAL: 0.8 mg/dL (ref 0.3–1.2)
BUN: 20 mg/dL (ref 6–23)
CALCIUM: 9.3 mg/dL (ref 8.4–10.5)
CHLORIDE: 104 meq/L (ref 96–112)
CO2: 28 mEq/L (ref 19–32)
Creatinine, Ser: 0.62 mg/dL (ref 0.50–1.10)
GFR calc Af Amer: >90 mL/min (ref >90)
GFR calc non Af Amer: 84 mL/min — ABNORMAL LOW (ref >90)
Glucose, Bld: 116 mg/dL — ABNORMAL HIGH (ref 70–99)
Potassium: 3.6 mEq/L — ABNORMAL LOW (ref 3.7–5.3)
Sodium: 143 mEq/L (ref 137–147)
Total Protein: 5.9 g/dL — ABNORMAL LOW (ref 6.0–8.3)

## 2013-10-12 LAB — CBC
HCT: 32.6 % — ABNORMAL LOW (ref 36.0–46.0)
HEMOGLOBIN: 10.3 g/dL — AB (ref 12.0–15.0)
MCH: 28.1 pg (ref 26.0–34.0)
MCHC: 31.6 g/dL (ref 30.0–36.0)
MCV: 88.8 fL (ref 78.0–100.0)
PLATELETS: 109 10*3/uL — AB (ref 150–400)
RBC: 3.67 MIL/uL — AB (ref 3.87–5.11)
RDW: 21.3 % — ABNORMAL HIGH (ref 11.5–15.5)
WBC: 46.7 10*3/uL — AB (ref 4.0–10.5)

## 2013-10-12 LAB — URINALYSIS, ROUTINE W REFLEX MICROSCOPIC
Bilirubin Urine: NEGATIVE
GLUCOSE, UA: 250 mg/dL — AB
Hgb urine dipstick: NEGATIVE
Ketones, ur: NEGATIVE mg/dL
LEUKOCYTES UA: NEGATIVE
Nitrite: NEGATIVE
PH: 6.5 (ref 5.0–8.0)
Protein, ur: 30 mg/dL — AB
SPECIFIC GRAVITY, URINE: 1.019 (ref 1.005–1.030)
Urobilinogen, UA: 0.2 mg/dL (ref 0.0–1.0)

## 2013-10-12 LAB — LACTATE DEHYDROGENASE: LDH: 687 U/L — ABNORMAL HIGH (ref 94–250)

## 2013-10-12 LAB — URIC ACID: URIC ACID, SERUM: 0.8 mg/dL — AB (ref 2.4–7.0)

## 2013-10-12 LAB — PHOSPHORUS: Phosphorus: 3.1 mg/dL (ref 2.3–4.6)

## 2013-10-12 LAB — URINE MICROSCOPIC-ADD ON

## 2013-10-12 MED ORDER — SODIUM BICARBONATE 8.4 % IV SOLN
INTRAVENOUS | Status: DC
  Administered 2013-10-12 – 2013-10-15 (×8): via INTRAVENOUS
  Filled 2013-10-12 (×15): qty 100

## 2013-10-12 MED ORDER — CEFAZOLIN SODIUM-DEXTROSE 2-3 GM-% IV SOLR
2.0000 g | INTRAVENOUS | Status: AC
  Administered 2013-10-14: 2 g via INTRAVENOUS
  Filled 2013-10-12 (×2): qty 50

## 2013-10-12 NOTE — Progress Notes (Signed)
[facility] PROGRESS NOTE  Margaret Mann VHQ:[id number] DOB: 1933-03-19 DOA: 10/07/2013 PCP: Osborne Casco, MD  80 y/o ?, known h/o R breast Ca DCIS s/p R mastectomy + XRt completed rx 09/05/06, h/o IA endometrial cancer status post TAH and BSO on 09/02/2005 status post adjuvant chemotherapy and brachytherapy. Completed treatments July 2010, h/o CLL with 17 P  Likely P 53 related--Rx 6 cycles Bendamustine + Rituxan admitted with falls weakness and confusion Found to have Persistent leukocytosis consistent probably with High risk CLL Scans were perfomred and it was noted that she  Was found to have new 5.2 cm L supraclavicular NOdal mass in sup mediastinum, New Multifocal hepatic mets with significant overall disease progression Patient ultimately decided after long discussions with oncology to undergo treatment knowing fully well that may be a life limiting process    Assessment/Plan: SIRS (systemic inflammatory response syndrome), resolved -WBC's ? likely due to CLL -tachycardia resolved   Acute encephalopathy resolved -due to hypercalcemia, most likely  Cavernous hemangioma with small bleeding-moderate thrombocytopenia -hold heparin products  -SCDs ordered  Hypercalcemia , corrected calcium = 11.14  ???10.5 -Secondary to malignancy  As PTH LESS than 2.5, was on supplements as well calicium carbonate -TSH WNL, potentially some contribution from hydrochlorothiazide, unlikely vitamin D excess/milk alkali syndrome -received Zometa 4/2-follow calcium daily  AKI  -Secondary to dehydration and continue use of diuretics -d/c diuretics -Aggressive hydration  DCIS right breast since 2007  Endometrial cancer stage IA, grade 3, mixed endometrioid and clear cell ,  CLL initial stage I , 2013  -17 chromosome abnormality on marrow biopsy. Treatment with 6 cycles of Bendamustine plus Rituxan between 05/03/2012 and 09/21/2012 with a complete response.  -Last WBC approx 1 month  ago 13,000  ???44,000 -Patient at high risk for tumor lysis syndrome -rasburicase ordered, Solu-Medrol 125 ordered daily, monitor closely phosphorus uric acid LDH and other labs -Aggressive hydration  IV fluid rate after 100 cc per hour 4/3 -Oncologist to detailed discussions with family about options for treatment which may include Ibrutinib-in the interim Cleotis Lema has been ordered to start 4/6 and patient has been transferred to the oncology floor  Hepatic metastases of unknown etiology -Defer workup to oncologist -LFTs elevated but trending down  Essential hypertension, benign  Continue lisinopril 5 daily.  Stable. -continue holding HCTZ due to dehydration, tendency to cause hypercalcemia  TCP Probably 2/2 to primary process.  Waych platelets and bleeding. Gauic stools if drops below 50, 000  Impaired glucose tolerance Monitor Cover blood sugar if above 180 - 200  HYPERLIPIDEMIA  continue atorvastatin  40 daily on d/c-hold for now   Chronic compensated diastolic dysfunction: Last echo with EF WNL (60%) -continue holding diuretics -continue IVF's + 1 l this admit  Moderate protein calorie malnutrition -continue feeding supplements BID -nutrition following patient  Physical deconditioning -PT recommending SNF if unable to 24/7 supervision assistance unable to be provided at discharge -will follow clinical response   Code Status: Full Family Communication: Discussed at bedside with Cogliano,Robert  He will update rest of family Disposition Plan: to be determines- likely port placement 4/6 and chemo therafter   Consultants:  Oncology service  Procedures:  See below for x-ray reports  Antibiotics:  None   HPI/Subjective:  A lot more oriented and pleasant-close to her baseline as relative has seen her  Expresses  manyconcerns about r/b/a to chemo and explained Oncology will address these questions in due course. Eating better.  NO other complaint other than  nasal bleed  which is better with saline     Objective: Filed Vitals:   10/12/13 1440  BP: 137/71  Pulse: 90  Temp: 97.8 F (36.6 C)  Resp: 18    Intake/Output Summary (Last 24 hours) at 10/12/13 1646 Last data filed at 10/12/13 1300  Gross per 24 hour  Intake    600 ml  Output    900 ml  Net   -300 ml   Filed Weights   10/11/13 0507 10/11/13 1141 10/12/13 0545  Weight: 65.6 kg (144 lb 10 oz) 66.7 kg (147 lb 0.8 oz) 66.1 kg (145 lb 11.6 oz)    Exam:   General:  Afebrile, feeling better. Much more clear  Cardiovascular: regular rate, no rubs or gallops  Respiratory: CTA bilaterally  Data Reviewed: Basic Metabolic Panel:  Recent Labs Lab 10/07/13 1210 10/08/13 0350 10/09/13 0343 10/10/13 1100 10/12/13 0412  NA 140 139 138 135* 143  K 5.2 3.8 3.5* 3.5* 3.6*  CL 99 102 98 95* 104  CO2 23 25 25 25 28   GLUCOSE 122* 83 97 97 116*  BUN 45* 33* 16 9 20   CREATININE 1.05 0.89 0.69 0.65 0.62  CALCIUM 12.3* 10.5  10.1 10.1 10.1 9.3  PHOS  --   --   --   --  3.1   Liver Function Tests:  Recent Labs Lab 10/07/13 1210 10/08/13 0350 10/09/13 0343 10/10/13 1100 10/12/13 0412  AST 77* 74* 68* 70* 52*  ALT 25 21 22 25 24   ALKPHOS 197* 184* 184* 202* 183*  BILITOT 1.6* 1.3* 1.2 1.1 0.8  PROT 7.0 5.6* 6.1 6.5 5.9*  ALBUMIN 3.5 2.7* 2.7* 2.7* 2.5*   CBC:  Recent Labs Lab 10/07/13 1210 10/08/13 0350 10/09/13 0343 10/10/13 0820 10/12/13 0412  WBC 49.8* 39.7* 50.4* 44.4* 46.7*  NEUTROABS 10.0* 9.5* 5.0 8.4*  --   HGB 12.6 10.7* 11.3* 11.1* 10.3*  HCT 38.7 33.2* 35.5* 34.2* 32.6*  MCV 87.8 88.3 89.0 88.4 88.8  PLT 174 138* 131* 129* 109*   CBG:  Recent Labs Lab 10/07/13 1203  GLUCAP 124*    Recent Results (from the past 240 hour(s))  CULTURE, BLOOD (ROUTINE X 2)     Status: None   Collection Time    10/07/13  4:25 PM      Result Value Ref Range Status   Specimen Description BLOOD LEFT HAND   Final   Special Requests BOTTLES DRAWN AEROBIC AND  ANAEROBIC 10CC   Final   Culture  Setup Time     Final   Value: 10/07/2013 19:07     Performed at [facility]   Culture     Final   Value:        BLOOD CULTURE RECEIVED NO GROWTH TO DATE CULTURE WILL BE HELD FOR 5 DAYS BEFORE ISSUING A FINAL NEGATIVE REPORT     Performed at [facility]   Report Status PENDING   Incomplete  CULTURE, BLOOD (ROUTINE X 2)     Status: None   Collection Time    10/07/13  4:33 PM      Result Value Ref Range Status   Specimen Description BLOOD LEFT ARM   Final   Special Requests BOTTLES DRAWN AEROBIC AND ANAEROBIC 10CC   Final   Culture  Setup Time     Final   Value: 10/07/2013 19:07     Performed at [facility]   Culture     Final   Value:  BLOOD CULTURE RECEIVED NO GROWTH TO DATE CULTURE WILL BE HELD FOR 5 DAYS BEFORE ISSUING A FINAL NEGATIVE REPORT     Performed at [facility]   Report Status PENDING   Incomplete     Studies: No results found.  Scheduled Meds: . acyclovir  400 mg Oral Daily  . allopurinol  150 mg Oral Daily  . aspirin EC  81 mg Oral Daily  . atorvastatin  40 mg Oral QHS  . [START ON 10/14/2013]  ceFAZolin (ANCEF) IV  2 g Intravenous On Call  . cholecalciferol  2,000 Units Oral Daily  . feeding supplement (ENSURE COMPLETE)  237 mL Oral BID BM  . lisinopril  5 mg Oral Daily  . loratadine  10 mg Oral Daily  . methylPREDNISolone (SOLU-MEDROL) injection  125 mg Intravenous Daily  . multivitamin with minerals  1 tablet Oral Daily  . potassium chloride SA  20 mEq Oral Daily  . sodium chloride  3 mL Intravenous Q12H   Continuous Infusions: .  sodium bicarbonate  infusion 1000 mL 100 mL/hr at 10/12/13 7341    Principal Problem:   SIRS (systemic inflammatory response syndrome) Active Problems:   DUCTAL CARCINOMA IN SITU, RIGHT BREAST   HYPERLIPIDEMIA   Essential hypertension, benign   DIASTOLIC DYSFUNCTION   CLL (chronic lymphocytic leukemia)   Uterine cancer   Dehydration    Hypercalcemia   Malnutrition of moderate degree    Time spent: >30 minutes   Verneita Griffes, MD [facility] (P) [phone]

## 2013-10-12 NOTE — H&P (Signed)
Margaret Mann is an 80 y.o. female.   Chief Complaint: admitted for weakness and falls at home Work up reveals recurrence of CLL- chronic lymphocytic leukemia Also has Hx Uterine Ca and Breast Ca Pt scheduled for Port A Cath placement for chemo Scheduled for Mon 4/6 Pt has PAC previously and desires this one.  HPI: HTN; HLD; RBBB; CLL; Breast Ca; Ut Ca; CHF  Past Medical History  Diagnosis Date  . Hypertension   . Hyperlipemia   . RBBB   . Lymphocytic leukemia   . Breast cancer     rt lump-08,lt lump 14  . Arthritis   . Tuberculosis     as a teen-was treated  . CHF (congestive heart failure)     hx 04-cardiac work up 2010-no signs chf  . HOH (hard of hearing)   . Anemia   . Osteopenia     Past Surgical History  Procedure Laterality Date  . Biopsy breast  2008    rt  . Endometrial biopsy  2010  . Appendectomy    . Ganglion cyst excision  2011    left hand  . Diagnostic laparoscopy      node bx  . Abdominal hysterectomy  2010    BSO plus node bx  . Eye surgery      both cataracts  . Breast surgery  2008    rt lump-snbx  . Breast lumpectomy with needle localization Left 04/09/2013    Procedure: BREAST LUMPECTOMY WITH NEEDLE LOCALIZATION;  Surgeon: Edward Jolly, MD;  Location: [facility];  Service: General;  Laterality: Left;    Family History  Problem Relation Age of Onset  . Cancer Mother     colon  . Parkinson's disease Mother   . Cancer Father     brain  . Cancer Sister     liver mets  . Hypothyroidism Sister    Social History:  reports that she quit smoking about 51 years ago. She does not have any smokeless tobacco history on file. She reports that she does not drink alcohol or use illicit drugs.  Allergies:  Allergies  Allergen Reactions  . Dilaudid [Hydromorphone Hcl] Nausea And Vomiting    Medications Prior to Admission  Medication Sig Dispense Refill  . aspirin EC 81 MG tablet Take 81 mg by mouth daily.      Marland Kitchen  atorvastatin (LIPITOR) 40 MG tablet Take 40 mg by mouth at bedtime.       . calcium carbonate (OS-CAL) 600 MG TABS tablet Take 600 mg by mouth 2 (two) times daily with a meal.      . cetirizine (ZYRTEC) 10 MG tablet Take 10 mg by mouth daily.      . Cholecalciferol (VITAMIN D3) 2000 UNITS TABS Take 1 tablet by mouth daily.      . cyclobenzaprine (FLEXERIL) 10 MG tablet Take 10 mg by mouth at bedtime as needed for muscle spasms.      Marland Kitchen etodolac (LODINE) 400 MG tablet Take 400 mg by mouth 2 (two) times daily as needed (as needed for back pain). With food      . hydrochlorothiazide (HYDRODIURIL) 25 MG tablet Take 25 mg by mouth daily.      Marland Kitchen lisinopril (PRINIVIL,ZESTRIL) 5 MG tablet Take 5 mg by mouth daily.      . Multiple Vitamin (MULTIVITAMIN) capsule Take 1 capsule by mouth daily.      . potassium chloride SA (K-DUR,KLOR-CON) 20 MEQ tablet Take 20 mEq  by mouth daily.      . traMADol (ULTRAM) 50 MG tablet Take 1-2 tablets (50-100 mg total) by mouth every 6 (six) hours as needed for pain.  30 tablet  1    Results for orders placed during the hospital encounter of 10/07/13 (from the past 48 hour(s))  HEPATITIS B SURFACE ANTIBODY     Status: None   Collection Time    10/11/13  9:35 AM      Result Value Ref Range   Hep B S Ab NEGATIVE  NEGATIVE   Comment: Performed at [facility]     Status: None   Collection Time    10/11/13  9:35 AM      Result Value Ref Range   Hepatitis B Surface Ag NEGATIVE  NEGATIVE   Comment: Performed at [facility], [facility]     Status: None   Collection Time    10/11/13  9:35 AM      Result Value Ref Range   Hep B C IgM NON REACTIVE  NON REACTIVE   Comment: (NOTE)     High levels of Hepatitis B Core IgM antibody are detectable     during the acute stage of Hepatitis B. This antibody is used     to differentiate current from past HBV infection.     Performed at [facility]  ACID     Status: None   Collection Time    10/11/13  9:35 AM      Result Value Ref Range   Uric Acid, Serum 5.3  2.4 - 7.0 mg/dL  URIC ACID     Status: Abnormal   Collection Time    10/12/13  4:12 AM      Result Value Ref Range   Uric Acid, Serum 0.8 (*) 2.4 - 7.0 mg/dL  LACTATE DEHYDROGENASE     Status: Abnormal   Collection Time    10/12/13  4:12 AM      Result Value Ref Range   LDH 687 (*) 94 - 250 U/L  PHOSPHORUS     Status: None   Collection Time    10/12/13  4:12 AM      Result Value Ref Range   Phosphorus 3.1  2.3 - 4.6 mg/dL  COMPREHENSIVE METABOLIC PANEL     Status: Abnormal   Collection Time    10/12/13  4:12 AM      Result Value Ref Range   Sodium 143  137 - 147 mEq/L   Comment: DELTA CHECK NOTED     REPEATED TO VERIFY   Potassium 3.6 (*) 3.7 - 5.3 mEq/L   Chloride 104  96 - 112 mEq/L   Comment: DELTA CHECK NOTED     REPEATED TO VERIFY   CO2 28  19 - 32 mEq/L   Glucose, Bld 116 (*) 70 - 99 mg/dL   BUN 20  6 - 23 mg/dL   Comment: DELTA CHECK NOTED     REPEATED TO VERIFY   Creatinine, Ser 0.62  0.50 - 1.10 mg/dL   Calcium 9.3  8.4 - 10.5 mg/dL   Total Protein 5.9 (*) 6.0 - 8.3 g/dL   Albumin 2.5 (*) 3.5 - 5.2 g/dL   AST 52 (*) 0 - 37 U/L   ALT 24  0 - 35 U/L   Alkaline Phosphatase 183 (*) 39 - 117 U/L   Total Bilirubin 0.8  0.3 - 1.2  mg/dL   GFR calc non Af Amer 84 (*) >90 mL/min   GFR calc Af Amer >90  >90 mL/min   Comment: (NOTE)     The eGFR has been calculated using the CKD EPI equation.     This calculation has not been validated in all clinical situations.     eGFR's persistently <90 mL/min signify possible Chronic Kidney     Disease.  CBC     Status: Abnormal   Collection Time    10/12/13  4:12 AM      Result Value Ref Range   WBC 46.7 (*) 4.0 - 10.5 K/uL   Comment: CONSISTENT WITH PREVIOUS RESULT   RBC 3.67 (*) 3.87 - 5.11 MIL/uL   Hemoglobin 10.3 (*) 12.0 - 15.0 g/dL   HCT 32.6 (*) 36.0 - 46.0 %   MCV 88.8  78.0 - 100.0 fL   MCH 28.1   26.0 - 34.0 pg   MCHC 31.6  30.0 - 36.0 g/dL   RDW 21.3 (*) 11.5 - 15.5 %   Platelets 109 (*) 150 - 400 K/uL   Comment: REPEATED TO VERIFY     SPECIMEN CHECKED FOR CLOTS     PLATELET COUNT CONFIRMED BY SMEAR   No results found.  Review of Systems  Constitutional: Positive for weight loss. Negative for fever.  Respiratory: Positive for shortness of breath.   Cardiovascular: Negative for chest pain.  Gastrointestinal: Negative for nausea, vomiting and abdominal pain.  Musculoskeletal: Positive for myalgias.  Neurological: Positive for weakness. Negative for dizziness and headaches.    Blood pressure 149/80, pulse 66, temperature 98.3 F (36.8 C), temperature source Oral, resp. rate 18, height $RemoveBe'5\' 4"'HLcmkNwRs$  (1.626 m), weight 66.1 kg (145 lb 11.6 oz), SpO2 92.00%. Physical Exam  Constitutional: She is oriented to person, place, and time.  Cardiovascular: Normal rate and regular rhythm.   No murmur heard. Respiratory: Effort normal and breath sounds normal. She has no wheezes.  GI: Soft. Bowel sounds are normal. There is no tenderness.  Musculoskeletal: Normal range of motion.  Neurological: She is alert and oriented to person, place, and time.  Skin: Skin is warm and dry.  Psychiatric: She has a normal mood and affect. Her behavior is normal. Judgment and thought content normal.     Assessment/Plan Recurrent CLL Now scheduled for PAC placement for chemo 4/6 in IR Pt and family aware of procedure benefits and risks and agreeable to proceed Consent signed and in chart Check coags  [place] A 10/12/2013, 12:09 PM

## 2013-10-12 NOTE — Progress Notes (Signed)
Margaret Mann   DOB:[birth year]   PF#:[id number]   ZHG#:[id number]    Subjective: (name pronounced "pat-E-low") was constipated, but "that was taken care of." Ambulates to the end of the hall, then gets SOB and has to stop. No pain, fever, bleeding. No SE from steroids that she is aware of-- overall "feels better." No family in room   Objective: older White woman examined in recliner Filed Vitals:   10/12/13 0545  BP: 149/80  Pulse: 66  Temp: 98.3 F (36.8 C)  Resp: 18    Body mass index is 25 kg/(m^2).  Intake/Output Summary (Last 24 hours) at 10/12/13 0818 Last data filed at 10/12/13 0500  Gross per 24 hour  Intake    525 ml  Output   1350 ml  Net   -825 ml     Sclerae unicteric  Lungs clear -- no rales or rhonchi, fair excursion bilaterally  Heart regular rate and rhythm  Abdomen soft, NT, +BS  Neuro nonfocal, well oriented, anxious affect  Breast exam: deferred  CBG (last 3)  No results found for this basename: GLUCAP,  in the last 72 hours   Labs:  Lab Results  Component Value Date   WBC 46.7* 10/12/2013   HGB 10.3* 10/12/2013   HCT 32.6* 10/12/2013   MCV 88.8 10/12/2013   PLT 109* 10/12/2013   NEUTROABS 8.4* 10/10/2013    @LASTCHEMISTRY @  Urine Studies No results found for this basename: UACOL, UAPR, USPG, UPH, UTP, UGL, UKET, UBIL, UHGB, UNIT, UROB, ULEU, UEPI, UWBC, URBC, UBAC, CAST, CRYS, UCOM, BILUA,  in the last 72 hours  Basic Metabolic Panel:  Recent Labs Lab 10/07/13 1210 10/08/13 0350 10/09/13 0343 10/10/13 1100 10/12/13 0412  NA 140 139 138 135* 143  K 5.2 3.8 3.5* 3.5* 3.6*  CL 99 102 98 95* 104  CO2 23 25 25 25 28   GLUCOSE 122* 83 97 97 116*  BUN 45* 33* 16 9 20   CREATININE 1.05 0.89 0.69 0.65 0.62  CALCIUM 12.3* 10.5  10.1 10.1 10.1 9.3  PHOS  --   --   --   --  3.1   GFR Estimated Creatinine Clearance: 54.3 ml/min (by C-G formula based on Cr of 0.62). Liver Function Tests:  Recent Labs Lab 10/07/13 1210 10/08/13 0350 10/09/13 0343  10/10/13 1100 10/12/13 0412  AST 77* 74* 68* 70* 52*  ALT 25 21 22 25 24   ALKPHOS 197* 184* 184* 202* 183*  BILITOT 1.6* 1.3* 1.2 1.1 0.8  PROT 7.0 5.6* 6.1 6.5 5.9*  ALBUMIN 3.5 2.7* 2.7* 2.7* 2.5*   No results found for this basename: LIPASE, AMYLASE,  in the last 168 hours No results found for this basename: AMMONIA,  in the last 168 hours Coagulation profile No results found for this basename: INR, PROTIME,  in the last 168 hours  CBC:  Recent Labs Lab 10/07/13 1210 10/08/13 0350 10/09/13 0343 10/10/13 0820 10/12/13 0412  WBC 49.8* 39.7* 50.4* 44.4* 46.7*  NEUTROABS 10.0* 9.5* 5.0 8.4*  --   HGB 12.6 10.7* 11.3* 11.1* 10.3*  HCT 38.7 33.2* 35.5* 34.2* 32.6*  MCV 87.8 88.3 89.0 88.4 88.8  PLT 174 138* 131* 129* 109*   Cardiac Enzymes: No results found for this basename: CKTOTAL, CKMB, CKMBINDEX, TROPONINI,  in the last 168 hours BNP: No components found with this basename: POCBNP,  CBG:  Recent Labs Lab 10/07/13 1203  GLUCAP 124*   D-Dimer No results found for this basename: DDIMER,  in the last  72 hours Hgb A1c No results found for this basename: HGBA1C,  in the last 72 hours Lipid Profile No results found for this basename: CHOL, HDL, LDLCALC, TRIG, CHOLHDL, LDLDIRECT,  in the last 72 hours Thyroid function studies No results found for this basename: TSH, T4TOTAL, FREET3, T3FREE, THYROIDAB,  in the last 72 hours Anemia work up No results found for this basename: VITAMINB12, FOLATE, FERRITIN, TIBC, IRON, RETICCTPCT,  in the last 72 hours Microbiology Recent Results (from the past 240 hour(s))  CULTURE, BLOOD (ROUTINE X 2)     Status: None   Collection Time    10/07/13  4:25 PM      Result Value Ref Range Status   Specimen Description BLOOD LEFT HAND   Final   Special Requests BOTTLES DRAWN AEROBIC AND ANAEROBIC 10CC   Final   Culture  Setup Time     Final   Value: 10/07/2013 19:07     Performed at [facility]   Culture     Final   Value:         BLOOD CULTURE RECEIVED NO GROWTH TO DATE CULTURE WILL BE HELD FOR 5 DAYS BEFORE ISSUING A FINAL NEGATIVE REPORT     Performed at [facility]   Report Status PENDING   Incomplete  CULTURE, BLOOD (ROUTINE X 2)     Status: None   Collection Time    10/07/13  4:33 PM      Result Value Ref Range Status   Specimen Description BLOOD LEFT ARM   Final   Special Requests BOTTLES DRAWN AEROBIC AND ANAEROBIC 10CC   Final   Culture  Setup Time     Final   Value: 10/07/2013 19:07     Performed at [facility]   Culture     Final   Value:        BLOOD CULTURE RECEIVED NO GROWTH TO DATE CULTURE WILL BE HELD FOR 5 DAYS BEFORE ISSUING A FINAL NEGATIVE REPORT     Performed at [facility]   Report Status PENDING   Incomplete      Studies:  Dg Chest 2 View  10/07/2013   CLINICAL DATA:  Chest pain, weakness and history of CHF.  EXAM: CHEST - 2 VIEW  COMPARISON:  CT CHEST W/CM dated 07/05/2012; DG CHEST 2 VIEW dated 05/04/2006  FINDINGS: Stable mild chronic lung disease. There is no evidence of pulmonary edema, consolidation, pneumothorax, nodule or pleural fluid. The heart size and mediastinal contours are within normal limits. The bony thorax is unremarkable.  IMPRESSION: No active disease.   Electronically Signed   By: Aletta Edouard M.D.   On: 10/07/2013 15:17   Ct Head Wo Contrast  10/07/2013   CLINICAL DATA:  Confusion, history of bilateral breast cancer  EXAM: CT HEAD WITHOUT CONTRAST  TECHNIQUE: Contiguous axial images were obtained from the base of the skull through the vertex without intravenous contrast.  COMPARISON:  None.  FINDINGS: Calvarium intact. No hemorrhage or extra-axial fluid. No evidence of vascular territory infarct or mass effect. No hydrocephalus. Mild age-related atrophy.  IMPRESSION: No acute findings   Electronically Signed   By: Skipper Cliche M.D.   On: 10/07/2013 13:39   Ct Chest W Contrast  10/09/2013   CLINICAL DATA:  Staging CLL  EXAM: CT CHEST,  ABDOMEN, AND PELVIS WITH CONTRAST  TECHNIQUE: Multidetector CT imaging of the chest, abdomen and pelvis was performed following the standard protocol during bolus administration of intravenous contrast.  CONTRAST:  152mL OMNIPAQUE IOHEXOL 300 MG/ML  SOLN  COMPARISON:  07/05/2012  FINDINGS: CT CHEST FINDINGS  3.2 x 5.2 cm left supraclavicular nodal mass (series 2/image 5), arising from the left superior mediastinum/prevascular region (series 2/image 14). This appearance is new/markedly progressed from 2013.  Additional small mediastinal and left axillary nodes, including a 10 mm subpectoral node (series 2/image 14), at the upper limits of normal.  Small left and trace right pleural effusions. Mild dependent atelectasis in the left lower lobe. No suspicious pulmonary nodules. Mild centrilobular emphysematous changes with mild biapical pleural parenchymal scarring. No pneumothorax.  Visualized thyroid is unremarkable.  The heart is normal in size.  No pericardial effusion.  Degenerative changes of the thoracic spine.  CT ABDOMEN AND PELVIS FINDINGS  Extensive upper abdominal/retroperitoneal lymphadenopathy, significantly progressed from 2013, including:  --3.4 cm short axis portacaval node (series 2/ image 60)  --4.9 cm short axis aggregate left para-aortic nodal mass (series 2/ image 66)  --4.0 cm short axis left jejunal mesenteric nodal mass (series 2/ image 80)  --5.5 cm short axis right jejunal mesenteric nodal mass (series 2/image 80)  Suspected multifocal hypoenhancing hepatic lesions throughout the liver, including:  --1.5 x 1.5 cm lesion centrally in the anterior segment right hepatic lobe (series 2/image 47)  --2.1 x 1.8 cm lesion in the caudate (series 2/image 50)  --1.2 x 1.3 cm lesion in the medial segment left hepatic lobe (series 2/image 51)  --1.4 x 1.4 cm lesion in the anterior segment right hepatic lobe (series 2/image 59)  Mild splenomegaly, measuring 13.9 cm in maximal craniocaudal dimension.  Possible subcentimeter hypoenhancing lesion inferiorly (series 2/image 57), equivocal.  Pancreas and adrenal glands are within normal limits.  Gallbladder is notable for layering gallstone (series 2/image 67), without associated inflammatory changes.  Kidneys are notable for an 11 mm lateral left lower pole renal cyst (series 4/image 39). No hydronephrosis.  No evidence of bowel obstruction.  Normal appendix.  Small volume abdominopelvic ascites.  No evidence of abdominal aortic aneurysm.  Status post hysterectomy.  No adnexal masses.  Bladder is within normal limits.  Degenerative changes of the lumbar spine.  IMPRESSION: 5.2 cm left supraclavicular nodal mass extending from the superior mediastinum, new/increased. Additional small mediastinal/ left axillary nodes.  Extensive upper abdominal/retroperitoneal lymphadenopathy, increased, as described above.  Multifocal hepatic metastases, new, with index lesions as described above.  Mild splenomegaly.  Overall appearance has significantly progressed from 2013.   Electronically Signed   By: Julian Hy M.D.   On: 10/09/2013 17:37   Mr Jeri Cos [id number] Contrast  10/08/2013   CLINICAL DATA:  New onset altered mental status. History of breast and endometrial cancer. History of leukemia.  EXAM: MRI HEAD WITHOUT AND WITH CONTRAST  TECHNIQUE: Multiplanar, multiecho pulse sequences of the brain and surrounding structures were obtained without and with intravenous contrast.  CONTRAST:  8mL MULTIHANCE GADOBENATE DIMEGLUMINE 529 MG/ML IV SOLN  COMPARISON:  CT HEAD W/O CM dated 10/07/2013  FINDINGS: A focal area of susceptibility is noted in the posterior left temporal lobe. There is some intrinsic T1 and T2 signal associated with this area suggesting acute/ subacute blood products. There is no enhancement associated with the lesion. No other focal areas of hemorrhage are evident. In retrospect, there is subtle density on the CT scan of the head from yesterday in the same  area.  No acute infarct formed other mass lesion is present.  Mild periventricular and subcortical diffuse white matter T2 hyperintensity is evident  bilaterally. Mild atrophy is present. The ventricles are of normal size. Flow is present in the major intracranial arteries. The patient is status post bilateral lens replacements. The globes and orbits are otherwise intact. The paranasal sinuses and mastoid air cells are clear.  IMPRESSION: 1. Focal 1 cm area in the left temporal lobe with significant susceptibility and central T1 and T2 hyperintensity compatible with acute/subacute hemorrhage. This may be in the setting of a more remote hemorrhage. The appearance favors that of a cavernous hemangioma width rebleed. There is no enhancement to suggest metastasis. Follow-up MRI in 6-8 weeks without and with contrast would be useful to assure expected evolution of this lesion. 2. No other areas of focal hemorrhage. 3. Mild atrophy and white matter disease. This is nonspecific, but likely reflects the sequela of chronic microvascular ischemia. Critical Value/emergent results were called by telephone at the time of interpretation on 10/08/2013 at 9:39 AM to Dr. Louellen Molder , who verbally acknowledged these results.   Electronically Signed   By: Lawrence Santiago M.D.   On: 10/08/2013 09:39   Ct Abdomen Pelvis W Contrast  10/09/2013   CLINICAL DATA:  Staging CLL  EXAM: CT CHEST, ABDOMEN, AND PELVIS WITH CONTRAST  TECHNIQUE: Multidetector CT imaging of the chest, abdomen and pelvis was performed following the standard protocol during bolus administration of intravenous contrast.  CONTRAST:  153mL OMNIPAQUE IOHEXOL 300 MG/ML  SOLN  COMPARISON:  07/05/2012  FINDINGS: CT CHEST FINDINGS  3.2 x 5.2 cm left supraclavicular nodal mass (series 2/image 5), arising from the left superior mediastinum/prevascular region (series 2/image 14). This appearance is new/markedly progressed from 2013.  Additional small mediastinal and left  axillary nodes, including a 10 mm subpectoral node (series 2/image 14), at the upper limits of normal.  Small left and trace right pleural effusions. Mild dependent atelectasis in the left lower lobe. No suspicious pulmonary nodules. Mild centrilobular emphysematous changes with mild biapical pleural parenchymal scarring. No pneumothorax.  Visualized thyroid is unremarkable.  The heart is normal in size.  No pericardial effusion.  Degenerative changes of the thoracic spine.  CT ABDOMEN AND PELVIS FINDINGS  Extensive upper abdominal/retroperitoneal lymphadenopathy, significantly progressed from 2013, including:  --3.4 cm short axis portacaval node (series 2/ image 60)  --4.9 cm short axis aggregate left para-aortic nodal mass (series 2/ image 66)  --4.0 cm short axis left jejunal mesenteric nodal mass (series 2/ image 80)  --5.5 cm short axis right jejunal mesenteric nodal mass (series 2/image 80)  Suspected multifocal hypoenhancing hepatic lesions throughout the liver, including:  --1.5 x 1.5 cm lesion centrally in the anterior segment right hepatic lobe (series 2/image 47)  --2.1 x 1.8 cm lesion in the caudate (series 2/image 50)  --1.2 x 1.3 cm lesion in the medial segment left hepatic lobe (series 2/image 51)  --1.4 x 1.4 cm lesion in the anterior segment right hepatic lobe (series 2/image 59)  Mild splenomegaly, measuring 13.9 cm in maximal craniocaudal dimension. Possible subcentimeter hypoenhancing lesion inferiorly (series 2/image 57), equivocal.  Pancreas and adrenal glands are within normal limits.  Gallbladder is notable for layering gallstone (series 2/image 67), without associated inflammatory changes.  Kidneys are notable for an 11 mm lateral left lower pole renal cyst (series 4/image 39). No hydronephrosis.  No evidence of bowel obstruction.  Normal appendix.  Small volume abdominopelvic ascites.  No evidence of abdominal aortic aneurysm.  Status post hysterectomy.  No adnexal masses.  Bladder is  within normal limits.  Degenerative changes of the  lumbar spine.  IMPRESSION: 5.2 cm left supraclavicular nodal mass extending from the superior mediastinum, new/increased. Additional small mediastinal/ left axillary nodes.  Extensive upper abdominal/retroperitoneal lymphadenopathy, increased, as described above.  Multifocal hepatic metastases, new, with index lesions as described above.  Mild splenomegaly.  Overall appearance has significantly progressed from 2013.   Electronically Signed   By: Julian Hy M.D.   On: 10/09/2013 17:37   Assessment: 80 y.o. [place] woman with a remote history of uterine cancer and DCIS, now with recurrent chronic lymphoid leukemia, 17p deleted, admitted for the start of methylprednisolone/ ofatumumab, at risk for tumor lysis syndrome, currently day 2 methylprednisolone, antibody planned for 10/14/2013  (1) hypercalcemia: s/p zolendronate 4 mg on 10/09/2013, PTH <2.5 on 10/08/2013   Results for MISHIKA, FLIPPEN (MRN [id number]) as of 10/12/2013 07:58  Ref. Range 10/07/2013 12:10 10/08/2013 03:50 10/09/2013 03:43 10/10/2013 11:00 10/12/2013 04:12  Calcium Latest Range: 8.4-10.5 mg/dL 12.3 (H) 10.5 10.1 10.1 9.3   (2) tumor lysis monitoring:   (a) s/p rasbicurase 10/11/2013, on allopurinol Results for GRISELDA, BRAMBLETT (MRN [id number]) as of 10/12/2013 07:58  Ref. Range 10/11/2013 09:35 10/12/2013 04:12  Uric Acid, Serum Latest Range: 2.6-7.4 mg/dl 5.3 0.8 (L)    (b) NS changed to D5W with HCO3 10/12/2013-- follow urinee pH   (c) WBC, LDH, creatinine, K+, phosphorus   Results for MAKYNLEE, KRESSIN (MRN [id number]) as of 10/12/2013 07:58  Ref. Range 09/02/2013 09:30  lymph# Latest Range: 0.9-3.3 10e3/uL 8.1 (H)  Results for SOLA, MARGOLIS (MRN [id number]) as of 10/12/2013 07:58  Ref. Range 09/02/2013 09:30 10/07/2013 12:10 10/08/2013 03:50 10/12/2013 04:12  LDH Latest Range: 125-245 U/L 287 (H) 722 (H) 677 (H) 687 (H)  Results for INFINITY, JEFFORDS (MRN [id number]) as of 10/12/2013  07:58  Ref. Range 10/07/2013 12:10 10/08/2013 03:50 10/09/2013 03:43 10/10/2013 11:00 10/12/2013 04:12  Potassium Latest Range: 3.5-5.1 mEq/L 5.2 3.8 3.5 (L) 3.5 (L) 3.6 (L)  Results for KEYAIRRA, KOLINSKI (MRN [id number]) as of 10/12/2013 07:58  Ref. Range 10/07/2013 12:10 10/08/2013 03:50 10/09/2013 03:43 10/10/2013 11:00 10/12/2013 04:12  Creatinine Latest Range: 0.50-1.10 mg/dL 1.05 0.89 0.69 0.65 0.62    Results for FRANCINA, BEERY (MRN [id number]) as of 10/12/2013 07:58  Ref. Range 10/12/2013 04:12  Phosphorus Latest Range: 2.3-4.6 mg/dL 3.1   Plan: Marlisa is tolerating treatment well so far, with no evidence of TLS. She is very anxious about therapy and wanted reassurance the treatment will work and will not debilitate her--she wants to be able to play the piano at church 3x/week, drive, etc. Encouraged patient to walk halls and explained there are many new treatments even for p17 mutated CLL that Dr Alvy Bimler knows about and will use to Jordana's best advantage.   Chauncey Cruel, MD 10/12/2013  8:18 AM

## 2013-10-13 DIAGNOSIS — J3489 Other specified disorders of nose and nasal sinuses: Secondary | ICD-10-CM

## 2013-10-13 LAB — CBC WITH DIFFERENTIAL/PLATELET
BLASTS: 0 %
Band Neutrophils: 0 % (ref 0–10)
Basophils Absolute: 0 10*3/uL (ref 0.0–0.1)
Basophils Relative: 0 % (ref 0–1)
Eosinophils Absolute: 0 10*3/uL (ref 0.0–0.7)
Eosinophils Relative: 0 % (ref 0–5)
HCT: 32.8 % — ABNORMAL LOW (ref 36.0–46.0)
Hemoglobin: 10.7 g/dL — ABNORMAL LOW (ref 12.0–15.0)
Lymphocytes Relative: 81 % — ABNORMAL HIGH (ref 12–46)
Lymphs Abs: 48.1 10*3/uL — ABNORMAL HIGH (ref 0.7–4.0)
MCH: 28.8 pg (ref 26.0–34.0)
MCHC: 32.6 g/dL (ref 30.0–36.0)
MCV: 88.4 fL (ref 78.0–100.0)
MONO ABS: 0 10*3/uL — AB (ref 0.1–1.0)
MONOS PCT: 0 % — AB (ref 3–12)
MYELOCYTES: 0 %
Metamyelocytes Relative: 0 %
NRBC: 0 /100{WBCs}
Neutro Abs: 11.3 10*3/uL — ABNORMAL HIGH (ref 1.7–7.7)
Neutrophils Relative %: 19 % — ABNORMAL LOW (ref 43–77)
Platelets: 130 10*3/uL — ABNORMAL LOW (ref 150–400)
Promyelocytes Absolute: 0 %
RBC: 3.71 MIL/uL — ABNORMAL LOW (ref 3.87–5.11)
RDW: 21.6 % — ABNORMAL HIGH (ref 11.5–15.5)
WBC: 59.4 10*3/uL (ref 4.0–10.5)

## 2013-10-13 LAB — BASIC METABOLIC PANEL
BUN: 20 mg/dL (ref 6–23)
CO2: 34 mEq/L — ABNORMAL HIGH (ref 19–32)
Calcium: 9 mg/dL (ref 8.4–10.5)
Chloride: 97 mEq/L (ref 96–112)
Creatinine, Ser: 0.64 mg/dL (ref 0.50–1.10)
GFR, EST NON AFRICAN AMERICAN: 83 mL/min — AB (ref >90)
Glucose, Bld: 122 mg/dL — ABNORMAL HIGH (ref 70–99)
POTASSIUM: 3.9 meq/L (ref 3.7–5.3)
SODIUM: 140 meq/L (ref 137–147)

## 2013-10-13 LAB — CULTURE, BLOOD (ROUTINE X 2)
CULTURE: NO GROWTH
Culture: NO GROWTH

## 2013-10-13 LAB — URINALYSIS, ROUTINE W REFLEX MICROSCOPIC
Bilirubin Urine: NEGATIVE
Glucose, UA: NEGATIVE mg/dL
Hgb urine dipstick: NEGATIVE
Ketones, ur: NEGATIVE mg/dL
LEUKOCYTES UA: NEGATIVE
Nitrite: NEGATIVE
PH: 7.5 (ref 5.0–8.0)
PROTEIN: NEGATIVE mg/dL
SPECIFIC GRAVITY, URINE: 1.01 (ref 1.005–1.030)
Urobilinogen, UA: 0.2 mg/dL (ref 0.0–1.0)

## 2013-10-13 LAB — URIC ACID: Uric Acid, Serum: 2.5 mg/dL (ref 2.4–7.0)

## 2013-10-13 LAB — PHOSPHORUS: Phosphorus: 2.9 mg/dL (ref 2.3–4.6)

## 2013-10-13 LAB — LACTATE DEHYDROGENASE: LDH: 876 U/L — AB (ref 94–250)

## 2013-10-13 MED ORDER — LORATADINE 10 MG PO TABS: 10.0000 mg | ORAL_TABLET | Freq: Every day | ORAL | Status: DC

## 2013-10-13 NOTE — Progress Notes (Signed)
[facility] PROGRESS NOTE  Margaret Mann:[id number] DOB: 1932-07-29 DOA: 10/07/2013 PCP: Osborne Casco, MD  80 y/o ?, known h/o R breast Ca DCIS s/p R mastectomy + XRt completed rx 09/05/06, h/o IA endometrial cancer status post TAH and BSO on 09/02/2005 status post adjuvant chemotherapy and brachytherapy. Completed treatments July 2010, h/o CLL with 17 P  Likely P 53 related--Rx 6 cycles Bendamustine + Rituxan admitted with falls weakness and confusion Found to have Persistent leukocytosis consistent probably with High risk CLL Scans were perfomred and it was noted that she  Was found to have new 5.2 cm L supraclavicular NOdal mass in sup mediastinum, New Multifocal hepatic mets with significant overall disease progression Patient ultimately decided after long discussions with oncology to undergo treatment knowing fully well that may be a life limiting process    Assessment/Plan: SIRS (systemic inflammatory response syndrome), resolved -WBC's ? likely due to CLL -tachycardia resolved   Acute encephalopathy resolved -due to hypercalcemia, most likely  Cavernous hemangioma with small bleeding-moderate thrombocytopenia -hold heparin products  -SCDs ordered  Hypercalcemia , corrected calcium = 11.14  ???10.5 -Secondary to malignancy  As PTH LESS than 2.5, was on supplements as well calicium carbonate -TSH WNL, potentially some contribution from hydrochlorothiazide, unlikely vitamin D excess/milk alkali syndrome -received Zometa 4/2-follow calcium daily  AKI  -Secondary to dehydration and continue use of diuretics -d/c diuretics -Aggressive hydration  DCIS right breast since 2007  Endometrial cancer stage IA, grade 3, mixed endometrioid and clear cell ,  CLL initial stage I , 2013  -17 chromosome abnormality on marrow biopsy. Treatment with 6 cycles of Bendamustine plus Rituxan between 05/03/2012 and 09/21/2012 with a complete response.  -Last WBC approx 1 month  ago 13,000  ???44,000 -Patient at high risk for tumor lysis syndrome -rasburicase ordered, Solu-Medrol 125 ordered daily, monitor closely phosphorus uric acid LDH and other labs -Aggressive hydration  IV fluid rate after 100 cc per hour 4/3 -Oncologist to detailed discussions with family about options for treatment which may include Ibrutinib-in the interim Cleotis Lema has been ordered to start 4/6 and patient has been transferred to the oncology floor  Hepatic metastases of unknown etiology -Defer workup to oncologist -LFTs ordered for 4/6  Essential hypertension, benign  Continue lisinopril 5 daily.  Stable. -continue holding HCTZ due to dehydration, tendency to cause hypercalcemia  TCP Probably 2/2 to primary process.  Waych platelets and bleeding. Gauic stools if drops below 50, 000  Impaired glucose tolerance Monitor Cover blood sugar if above 180 - 200  HYPERLIPIDEMIA  continue atorvastatin  40 daily on d/c-hold for now   Chronic compensated diastolic dysfunction: Last echo with EF WNL (60%) -continue holding diuretics -continue IVF's + 1.03 l this admit  Moderate protein calorie malnutrition -continue feeding supplements BID -nutrition following patient  Physical deconditioning -PT recommending SNF if unable to 24/7 supervision assistance unable to be provided at discharge -will follow clinical response -pt to re-eval in am as might have met pre-determined goals   Code Status: Full Family Communication: Discussed at bedside with Go[place],Robert  He will update rest of family Disposition Plan: to be determines- likely port placement 4/6 and chemo thereafter   Consultants:  Oncology service  Procedures:  See below for x-ray reports  Antibiotics:  None   HPI/Subjective:  A lot more oriented and pleasant-close to her baseline as relative has seen her Feels a little more comfortable about plan of care No further nasal bleeding Note WBC 59 NO cp sob  n/v Ambulating very well     Objective: Filed Vitals:   10/13/13 1412  BP: 141/76  Pulse: 88  Temp: 98.9 F (37.2 C)  Resp: 16    Intake/Output Summary (Last 24 hours) at 10/13/13 1525 Last data filed at 10/13/13 1300  Gross per 24 hour  Intake    240 ml  Output   2275 ml  Net  -2035 ml   Filed Weights   10/11/13 1141 10/12/13 0545 10/13/13 0509  Weight: 66.7 kg (147 lb 0.8 oz) 66.1 kg (145 lb 11.6 oz) 66.4 kg (146 lb 6.2 oz)    Exam:   General:  Afebrile, feeling better. Much more clear  Cardiovascular: regular rate, no rubs or gallops  Respiratory: CTA bilaterally  Data Reviewed: Basic Metabolic Panel:  Recent Labs Lab 10/08/13 0350 10/09/13 0343 10/10/13 1100 10/12/13 0412 10/13/13 0352  NA 139 138 135* 143 140  K 3.8 3.5* 3.5* 3.6* 3.9  CL 102 98 95* 104 97  CO2 _0 34*  GLUCOSE 83 97 97 116* 122*  BUN 33* _1 CREATININE 0.89 0.69 0.65 0.62 0.64  CALCIUM 10.5  10.1 10.1 10.1 9.3 9.0  PHOS  --   --   --  3.1 2.9   Liver Function Tests:  Recent Labs Lab 10/07/13 1210 10/08/13 0350 10/09/13 0343 10/10/13 1100 10/12/13 0412  AST 77* 74* 68* 70* 52*  ALT _2 ALKPHOS 197* 184* 184* 202* 183*  BILITOT 1.6* 1.3* 1.2 1.1 0.8  PROT 7.0 5.6* 6.1 6.5 5.9*  ALBUMIN 3.5 2.7* 2.7* 2.7* 2.5*   CBC:  Recent Labs Lab 10/07/13 1210 10/08/13 0350 10/09/13 0343 10/10/13 0820 10/12/13 0412 10/13/13 0753  WBC 49.8* 39.7* 50.4* 44.4* 46.7* 59.4*  NEUTROABS 10.0* 9.5* 5.0 8.4*  --  11.3*  HGB 12.6 10.7* 11.3* 11.1* 10.3* 10.7*  HCT 38.7 33.2* 35.5* 34.2* 32.6* 32.8*  MCV 87.8 88.3 89.0 88.4 88.8 88.4  PLT 174 138* 131* 129* 109* 130*   CBG:  Recent Labs Lab 10/07/13 1203  GLUCAP 124*    Recent Results (from the past 240 hour(s))  CULTURE, BLOOD (ROUTINE X 2)     Status: None   Collection Time    10/07/13  4:25 PM      Result Value Ref Range Status   Specimen Description BLOOD LEFT HAND   Final   Special  Requests BOTTLES DRAWN AEROBIC AND ANAEROBIC 10CC   Final   Culture  Setup Time     Final   Value: 10/07/2013 19:07     Performed at [facility]   Culture     Final   Value: NO GROWTH 5 DAYS     Performed at [facility]   Report Status 10/13/2013 FINAL   Final  CULTURE, BLOOD (ROUTINE X 2)     Status: None   Collection Time    10/07/13  4:33 PM      Result Value Ref Range Status   Specimen Description BLOOD LEFT ARM   Final   Special Requests BOTTLES DRAWN AEROBIC AND ANAEROBIC 10CC   Final   Culture  Setup Time     Final   Value: 10/07/2013 19:07     Performed at [facility]   Culture     Final   Value: NO GROWTH 5 DAYS     Performed at [facility]   Report Status 10/13/2013 FINAL   Final  Studies: No results found.  Scheduled Meds: . acyclovir  400 mg Oral Daily  . allopurinol  150 mg Oral Daily  . aspirin EC  81 mg Oral Daily  . [START ON 10/14/2013]  ceFAZolin (ANCEF) IV  2 g Intravenous On Call  . cholecalciferol  2,000 Units Oral Daily  . feeding supplement (ENSURE COMPLETE)  237 mL Oral BID BM  . lisinopril  5 mg Oral Daily  . loratadine  10 mg Oral Daily  . methylPREDNISolone (SOLU-MEDROL) injection  125 mg Intravenous Daily  . multivitamin with minerals  1 tablet Oral Daily  . potassium chloride SA  20 mEq Oral Daily  . sodium chloride  3 mL Intravenous Q12H   Continuous Infusions: .  sodium bicarbonate  infusion 1000 mL 100 mL/hr at 10/13/13 2194    Principal Problem:   SIRS (systemic inflammatory response syndrome) Active Problems:   DUCTAL CARCINOMA IN SITU, RIGHT BREAST   HYPERLIPIDEMIA   Essential hypertension, benign   DIASTOLIC DYSFUNCTION   CLL (chronic lymphocytic leukemia)   Uterine cancer   Dehydration   Hypercalcemia   Malnutrition of moderate degree    Time spent: >30 minutes   Verneita Griffes, MD [facility] (P) [phone]

## 2013-10-13 NOTE — Progress Notes (Signed)
Margaret Mann   DOB:[birth year]   ZO#:[id number]   JWJ#:[id number]    Subjective: (name pronounced "pat-E-low") tells me her breathing is better-- she made it to "the beach" and then the end of the hall yesterday w/o having to sit down. She has mild sinus Sx and requests claritin. She remains very concerned that she does not want "chemo" or "feeling bad from the treatment just to live afew more months." No family in room   Objective: older White woman examined in bed  Filed Vitals:   10/13/13 0509  BP: 155/70  Pulse: 71  Temp: 97.8 F (36.6 C)  Resp: 16    Body mass index is 25.11 kg/(m^2).  Intake/Output Summary (Last 24 hours) at 10/13/13 0856 Last data filed at 10/13/13 0205  Gross per 24 hour  Intake    480 ml  Output   1850 ml  Net  -1370 ml     Sclerae unicteric  Oropharynx no thrush  Cheeks slightly flushed  Lungs clear -- no rales or rhonchi, fair excursion bilaterally  Heart regular rate and rhythm  Abdomen soft, NT, +BS  Neuro nonfocal, well oriented, appropriate affect  Breast exam: deferred  CBG (last 3)  No results found for this basename: GLUCAP,  in the last 72 hours   Labs:  Lab Results  Component Value Date   WBC 59.4* 10/13/2013   HGB 10.7* 10/13/2013   HCT 32.8* 10/13/2013   MCV 88.4 10/13/2013   PLT 130* 10/13/2013   NEUTROABS PENDING 10/13/2013    @LASTCHEMISTRY @  Urine Studies No results found for this basename: UACOL, UAPR, USPG, UPH, UTP, UGL, UKET, UBIL, UHGB, UNIT, UROB, ULEU, UEPI, UWBC, URBC, UBAC, CAST, CRYS, UCOM, BILUA,  in the last 72 hours  Basic Metabolic Panel:  Recent Labs Lab 10/08/13 0350 10/09/13 0343 10/10/13 1100 10/12/13 0412 10/13/13 0352  NA 139 138 135* 143 140  K 3.8 3.5* 3.5* 3.6* 3.9  CL 102 98 95* 104 97  CO2 25 25 25 28  34*  GLUCOSE 83 97 97 116* 122*  BUN 33* 16 9 20 20   CREATININE 0.89 0.69 0.65 0.62 0.64  CALCIUM 10.5  10.1 10.1 10.1 9.3 9.0  PHOS  --   --   --  3.1 2.9   GFR Estimated Creatinine  Clearance: 54.3 ml/min (by C-G formula based on Cr of 0.64). Liver Function Tests:  Recent Labs Lab 10/07/13 1210 10/08/13 0350 10/09/13 0343 10/10/13 1100 10/12/13 0412  AST 77* 74* 68* 70* 52*  ALT 25 21 22 25 24   ALKPHOS 197* 184* 184* 202* 183*  BILITOT 1.6* 1.3* 1.2 1.1 0.8  PROT 7.0 5.6* 6.1 6.5 5.9*  ALBUMIN 3.5 2.7* 2.7* 2.7* 2.5*   No results found for this basename: LIPASE, AMYLASE,  in the last 168 hours No results found for this basename: AMMONIA,  in the last 168 hours Coagulation profile No results found for this basename: INR, PROTIME,  in the last 168 hours  CBC:  Recent Labs Lab 10/07/13 1210 10/08/13 0350 10/09/13 0343 10/10/13 0820 10/12/13 0412 10/13/13 0753  WBC 49.8* 39.7* 50.4* 44.4* 46.7* 59.4*  NEUTROABS 10.0* 9.5* 5.0 8.4*  --  PENDING  HGB 12.6 10.7* 11.3* 11.1* 10.3* 10.7*  HCT 38.7 33.2* 35.5* 34.2* 32.6* 32.8*  MCV 87.8 88.3 89.0 88.4 88.8 88.4  PLT 174 138* 131* 129* 109* 130*   Cardiac Enzymes: No results found for this basename: CKTOTAL, CKMB, CKMBINDEX, TROPONINI,  in the last 168 hours BNP: No  components found with this basename: POCBNP,  CBG:  Recent Labs Lab 10/07/13 1203  GLUCAP 124*   D-Dimer No results found for this basename: DDIMER,  in the last 72 hours Hgb A1c No results found for this basename: HGBA1C,  in the last 72 hours Lipid Profile No results found for this basename: CHOL, HDL, LDLCALC, TRIG, CHOLHDL, LDLDIRECT,  in the last 72 hours Thyroid function studies No results found for this basename: TSH, T4TOTAL, FREET3, T3FREE, THYROIDAB,  in the last 72 hours Anemia work up No results found for this basename: VITAMINB12, FOLATE, FERRITIN, TIBC, IRON, RETICCTPCT,  in the last 72 hours Microbiology Recent Results (from the past 240 hour(s))  CULTURE, BLOOD (ROUTINE X 2)     Status: None   Collection Time    10/07/13  4:25 PM      Result Value Ref Range Status   Specimen Description BLOOD LEFT HAND   Final    Special Requests BOTTLES DRAWN AEROBIC AND ANAEROBIC 10CC   Final   Culture  Setup Time     Final   Value: 10/07/2013 19:07     Performed at [facility]   Culture     Final   Value:        BLOOD CULTURE RECEIVED NO GROWTH TO DATE CULTURE WILL BE HELD FOR 5 DAYS BEFORE ISSUING A FINAL NEGATIVE REPORT     Performed at [facility]   Report Status PENDING   Incomplete  CULTURE, BLOOD (ROUTINE X 2)     Status: None   Collection Time    10/07/13  4:33 PM      Result Value Ref Range Status   Specimen Description BLOOD LEFT ARM   Final   Special Requests BOTTLES DRAWN AEROBIC AND ANAEROBIC 10CC   Final   Culture  Setup Time     Final   Value: 10/07/2013 19:07     Performed at [facility]   Culture     Final   Value:        BLOOD CULTURE RECEIVED NO GROWTH TO DATE CULTURE WILL BE HELD FOR 5 DAYS BEFORE ISSUING A FINAL NEGATIVE REPORT     Performed at [facility]   Report Status PENDING   Incomplete      Studies:  Dg Chest 2 View  10/07/2013   CLINICAL DATA:  Chest pain, weakness and history of CHF.  EXAM: CHEST - 2 VIEW  COMPARISON:  CT CHEST W/CM dated 07/05/2012; DG CHEST 2 VIEW dated 05/04/2006  FINDINGS: Stable mild chronic lung disease. There is no evidence of pulmonary edema, consolidation, pneumothorax, nodule or pleural fluid. The heart size and mediastinal contours are within normal limits. The bony thorax is unremarkable.  IMPRESSION: No active disease.   Electronically Signed   By: Aletta Edouard M.D.   On: 10/07/2013 15:17   Ct Head Wo Contrast  10/07/2013   CLINICAL DATA:  Confusion, history of bilateral breast cancer  EXAM: CT HEAD WITHOUT CONTRAST  TECHNIQUE: Contiguous axial images were obtained from the base of the skull through the vertex without intravenous contrast.  COMPARISON:  None.  FINDINGS: Calvarium intact. No hemorrhage or extra-axial fluid. No evidence of vascular territory infarct or mass effect. No hydrocephalus. Mild  age-related atrophy.  IMPRESSION: No acute findings   Electronically Signed   By: Skipper Cliche M.D.   On: 10/07/2013 13:39   Ct Chest W Contrast  10/09/2013   CLINICAL DATA:  Staging CLL  EXAM: CT CHEST, ABDOMEN, AND PELVIS WITH CONTRAST  TECHNIQUE: Multidetector CT imaging of the chest, abdomen and pelvis was performed following the standard protocol during bolus administration of intravenous contrast.  CONTRAST:  128mL OMNIPAQUE IOHEXOL 300 MG/ML  SOLN  COMPARISON:  07/05/2012  FINDINGS: CT CHEST FINDINGS  3.2 x 5.2 cm left supraclavicular nodal mass (series 2/image 5), arising from the left superior mediastinum/prevascular region (series 2/image 14). This appearance is new/markedly progressed from 2013.  Additional small mediastinal and left axillary nodes, including a 10 mm subpectoral node (series 2/image 14), at the upper limits of normal.  Small left and trace right pleural effusions. Mild dependent atelectasis in the left lower lobe. No suspicious pulmonary nodules. Mild centrilobular emphysematous changes with mild biapical pleural parenchymal scarring. No pneumothorax.  Visualized thyroid is unremarkable.  The heart is normal in size.  No pericardial effusion.  Degenerative changes of the thoracic spine.  CT ABDOMEN AND PELVIS FINDINGS  Extensive upper abdominal/retroperitoneal lymphadenopathy, significantly progressed from 2013, including:  --3.4 cm short axis portacaval node (series 2/ image 60)  --4.9 cm short axis aggregate left para-aortic nodal mass (series 2/ image 66)  --4.0 cm short axis left jejunal mesenteric nodal mass (series 2/ image 80)  --5.5 cm short axis right jejunal mesenteric nodal mass (series 2/image 80)  Suspected multifocal hypoenhancing hepatic lesions throughout the liver, including:  --1.5 x 1.5 cm lesion centrally in the anterior segment right hepatic lobe (series 2/image 47)  --2.1 x 1.8 cm lesion in the caudate (series 2/image 50)  --1.2 x 1.3 cm lesion in the medial  segment left hepatic lobe (series 2/image 51)  --1.4 x 1.4 cm lesion in the anterior segment right hepatic lobe (series 2/image 59)  Mild splenomegaly, measuring 13.9 cm in maximal craniocaudal dimension. Possible subcentimeter hypoenhancing lesion inferiorly (series 2/image 57), equivocal.  Pancreas and adrenal glands are within normal limits.  Gallbladder is notable for layering gallstone (series 2/image 67), without associated inflammatory changes.  Kidneys are notable for an 11 mm lateral left lower pole renal cyst (series 4/image 39). No hydronephrosis.  No evidence of bowel obstruction.  Normal appendix.  Small volume abdominopelvic ascites.  No evidence of abdominal aortic aneurysm.  Status post hysterectomy.  No adnexal masses.  Bladder is within normal limits.  Degenerative changes of the lumbar spine.  IMPRESSION: 5.2 cm left supraclavicular nodal mass extending from the superior mediastinum, new/increased. Additional small mediastinal/ left axillary nodes.  Extensive upper abdominal/retroperitoneal lymphadenopathy, increased, as described above.  Multifocal hepatic metastases, new, with index lesions as described above.  Mild splenomegaly.  Overall appearance has significantly progressed from 2013.   Electronically Signed   By: Julian Hy M.D.   On: 10/09/2013 17:37   Mr Jeri Cos [id number] Contrast  10/08/2013   CLINICAL DATA:  New onset altered mental status. History of breast and endometrial cancer. History of leukemia.  EXAM: MRI HEAD WITHOUT AND WITH CONTRAST  TECHNIQUE: Multiplanar, multiecho pulse sequences of the brain and surrounding structures were obtained without and with intravenous contrast.  CONTRAST:  17mL MULTIHANCE GADOBENATE DIMEGLUMINE 529 MG/ML IV SOLN  COMPARISON:  CT HEAD W/O CM dated 10/07/2013  FINDINGS: A focal area of susceptibility is noted in the posterior left temporal lobe. There is some intrinsic T1 and T2 signal associated with this area suggesting acute/ subacute blood  products. There is no enhancement associated with the lesion. No other focal areas of hemorrhage are evident. In retrospect, there is subtle density on the CT  scan of the head from yesterday in the same area.  No acute infarct formed other mass lesion is present.  Mild periventricular and subcortical diffuse white matter T2 hyperintensity is evident bilaterally. Mild atrophy is present. The ventricles are of normal size. Flow is present in the major intracranial arteries. The patient is status post bilateral lens replacements. The globes and orbits are otherwise intact. The paranasal sinuses and mastoid air cells are clear.  IMPRESSION: 1. Focal 1 cm area in the left temporal lobe with significant susceptibility and central T1 and T2 hyperintensity compatible with acute/subacute hemorrhage. This may be in the setting of a more remote hemorrhage. The appearance favors that of a cavernous hemangioma width rebleed. There is no enhancement to suggest metastasis. Follow-up MRI in 6-8 weeks without and with contrast would be useful to assure expected evolution of this lesion. 2. No other areas of focal hemorrhage. 3. Mild atrophy and white matter disease. This is nonspecific, but likely reflects the sequela of chronic microvascular ischemia. Critical Value/emergent results were called by telephone at the time of interpretation on 10/08/2013 at 9:39 AM to Dr. Louellen Molder , who verbally acknowledged these results.   Electronically Signed   By: Lawrence Santiago M.D.   On: 10/08/2013 09:39   Ct Abdomen Pelvis W Contrast  10/09/2013   CLINICAL DATA:  Staging CLL  EXAM: CT CHEST, ABDOMEN, AND PELVIS WITH CONTRAST  TECHNIQUE: Multidetector CT imaging of the chest, abdomen and pelvis was performed following the standard protocol during bolus administration of intravenous contrast.  CONTRAST:  142mL OMNIPAQUE IOHEXOL 300 MG/ML  SOLN  COMPARISON:  07/05/2012  FINDINGS: CT CHEST FINDINGS  3.2 x 5.2 cm left supraclavicular nodal  mass (series 2/image 5), arising from the left superior mediastinum/prevascular region (series 2/image 14). This appearance is new/markedly progressed from 2013.  Additional small mediastinal and left axillary nodes, including a 10 mm subpectoral node (series 2/image 14), at the upper limits of normal.  Small left and trace right pleural effusions. Mild dependent atelectasis in the left lower lobe. No suspicious pulmonary nodules. Mild centrilobular emphysematous changes with mild biapical pleural parenchymal scarring. No pneumothorax.  Visualized thyroid is unremarkable.  The heart is normal in size.  No pericardial effusion.  Degenerative changes of the thoracic spine.  CT ABDOMEN AND PELVIS FINDINGS  Extensive upper abdominal/retroperitoneal lymphadenopathy, significantly progressed from 2013, including:  --3.4 cm short axis portacaval node (series 2/ image 60)  --4.9 cm short axis aggregate left para-aortic nodal mass (series 2/ image 66)  --4.0 cm short axis left jejunal mesenteric nodal mass (series 2/ image 80)  --5.5 cm short axis right jejunal mesenteric nodal mass (series 2/image 80)  Suspected multifocal hypoenhancing hepatic lesions throughout the liver, including:  --1.5 x 1.5 cm lesion centrally in the anterior segment right hepatic lobe (series 2/image 47)  --2.1 x 1.8 cm lesion in the caudate (series 2/image 50)  --1.2 x 1.3 cm lesion in the medial segment left hepatic lobe (series 2/image 51)  --1.4 x 1.4 cm lesion in the anterior segment right hepatic lobe (series 2/image 59)  Mild splenomegaly, measuring 13.9 cm in maximal craniocaudal dimension. Possible subcentimeter hypoenhancing lesion inferiorly (series 2/image 57), equivocal.  Pancreas and adrenal glands are within normal limits.  Gallbladder is notable for layering gallstone (series 2/image 67), without associated inflammatory changes.  Kidneys are notable for an 11 mm lateral left lower pole renal cyst (series 4/image 39). No  hydronephrosis.  No evidence of bowel obstruction.  Normal  appendix.  Small volume abdominopelvic ascites.  No evidence of abdominal aortic aneurysm.  Status post hysterectomy.  No adnexal masses.  Bladder is within normal limits.  Degenerative changes of the lumbar spine.  IMPRESSION: 5.2 cm left supraclavicular nodal mass extending from the superior mediastinum, new/increased. Additional small mediastinal/ left axillary nodes.  Extensive upper abdominal/retroperitoneal lymphadenopathy, increased, as described above.  Multifocal hepatic metastases, new, with index lesions as described above.  Mild splenomegaly.  Overall appearance has significantly progressed from 2013.   Electronically Signed   By: Julian Hy M.D.   On: 10/09/2013 17:37   Assessment: 80 y.o. [place] woman with a remote history of uterine cancer and DCIS, now with recurrent chronic lymphoid leukemia, 17p deleted, admitted for the start of methylprednisolone/ ofatumumab, at risk for tumor lysis syndrome, currently day 3 methylprednisolone, antibody planned for 10/14/2013  (1) hypercalcemia: s/p zolendronate 4 mg on 10/09/2013, PTH <2.5 on 10/08/2013  Results for FLORNCE, RECORD (MRN [id number]) as of 10/13/2013 08:58  Ref. Range 10/08/2013 03:50 10/09/2013 03:43 10/10/2013 11:00 10/12/2013 04:12 10/13/2013 03:52  Calcium Latest Range: 8.4-10.5 mg/dL 10.5 10.1 10.1 9.3 9.0    (2) tumor lysis monitoring:   (a) s/p rasbicurase 10/11/2013, on allopurinol Results for TAMEYAH, KOCH (MRN [id number]) as of 10/13/2013 08:58  Ref. Range 10/11/2013 09:35 10/12/2013 04:12 10/13/2013 03:52  Uric Acid, Serum Latest Range: 2.6-7.4 mg/dl 5.3 0.8 (L) 2.5    (b) NS changed to D5W with HCO3 10/12/2013-- follow urinee pH Results for EMBERLY, TOMASSO (MRN [id number]) as of 10/13/2013 08:58  Ref. Range 10/07/2013 12:32 10/12/2013 16:24 10/13/2013 06:10  pH Latest Range: 5.0-8.0  5.5 6.5 7.5    (c) WBC, LDH, creatinine, K+, phosphorus:  Diff pending this AM;  WBC increase may be demargination of polys  Results for SHARAY, BELLISSIMO (MRN [id number]) as of 10/13/2013 08:58  Ref. Range 10/07/2013 12:10 10/08/2013 03:50 10/12/2013 04:12 10/13/2013 03:52  LDH Latest Range: 125-245 U/L 722 (H) 677 (H) 687 (H) 876 (H)  Results for SAVANAH, BAYLES (MRN [id number]) as of 10/13/2013 08:58  Ref. Range 10/08/2013 03:50 10/09/2013 03:43 10/10/2013 11:00 10/12/2013 04:12 10/13/2013 03:52  Potassium Latest Range: 3.5-5.1 mEq/L 3.8 3.5 (L) 3.5 (L) 3.6 (L) 3.9  Results for BEAUTIFULL, CISAR (MRN [id number]) as of 10/13/2013 08:58  Ref. Range 10/12/2013 04:12 10/13/2013 03:52  Phosphorus Latest Range: 2.3-4.6 mg/dL 3.1 2.9    Plan: Margaret Mann is tolerating steroids well with no evidence of TLS, also no clear evidence of response. She is very anxious about therapy and wanted reassurance the treatment will work and will not debilitate her--she wants to be able to play the piano at church 3x/week, drive, etc She wants "quaity not quantity.". Encouraged patient to walk halls and explained there are many new treatments even for p17 mutated CLL that Dr Alvy Bimler knows about and will use to Margaret Mann's best advantage--specifically went over the fact that ofatumumab is NOT chemotherapy but a form of immunotherapy. Dr Alvy Bimler will continue to address.  Added claritin at patient's request.   Chauncey Cruel, MD 10/13/2013  8:56 AM

## 2013-10-14 ENCOUNTER — Other Ambulatory Visit: Payer: Self-pay | Admitting: *Deleted

## 2013-10-14 ENCOUNTER — Inpatient Hospital Stay (HOSPITAL_COMMUNITY): Payer: Medicare Other

## 2013-10-14 ENCOUNTER — Other Ambulatory Visit: Payer: Self-pay | Admitting: Hematology and Oncology

## 2013-10-14 ENCOUNTER — Encounter: Payer: Self-pay | Admitting: Hematology and Oncology

## 2013-10-14 DIAGNOSIS — Z87898 Personal history of other specified conditions: Secondary | ICD-10-CM

## 2013-10-14 DIAGNOSIS — C9110 Chronic lymphocytic leukemia of B-cell type not having achieved remission: Secondary | ICD-10-CM

## 2013-10-14 DIAGNOSIS — C55 Malignant neoplasm of uterus, part unspecified: Secondary | ICD-10-CM

## 2013-10-14 DIAGNOSIS — R7989 Other specified abnormal findings of blood chemistry: Secondary | ICD-10-CM

## 2013-10-14 DIAGNOSIS — D0590 Unspecified type of carcinoma in situ of unspecified breast: Secondary | ICD-10-CM

## 2013-10-14 DIAGNOSIS — R6889 Other general symptoms and signs: Secondary | ICD-10-CM

## 2013-10-14 LAB — BASIC METABOLIC PANEL
BUN: 16 mg/dL (ref 6–23)
CHLORIDE: 94 meq/L — AB (ref 96–112)
CO2: 37 mEq/L — ABNORMAL HIGH (ref 19–32)
Calcium: 8.8 mg/dL (ref 8.4–10.5)
Creatinine, Ser: 0.65 mg/dL (ref 0.50–1.10)
GFR calc Af Amer: >90 mL/min (ref >90)
GFR calc non Af Amer: 83 mL/min — ABNORMAL LOW (ref >90)
Glucose, Bld: 124 mg/dL — ABNORMAL HIGH (ref 70–99)
POTASSIUM: 3.5 meq/L — AB (ref 3.7–5.3)
Sodium: 140 mEq/L (ref 137–147)

## 2013-10-14 LAB — URIC ACID: URIC ACID, SERUM: 3.6 mg/dL (ref 2.4–7.0)

## 2013-10-14 LAB — URINALYSIS, ROUTINE W REFLEX MICROSCOPIC
Bilirubin Urine: NEGATIVE
Glucose, UA: NEGATIVE mg/dL
Hgb urine dipstick: NEGATIVE
KETONES UR: NEGATIVE mg/dL
LEUKOCYTES UA: NEGATIVE
NITRITE: NEGATIVE
Protein, ur: NEGATIVE mg/dL
SPECIFIC GRAVITY, URINE: 1.009 (ref 1.005–1.030)
Urobilinogen, UA: 0.2 mg/dL (ref 0.0–1.0)
pH: 8 (ref 5.0–8.0)

## 2013-10-14 LAB — HEPATIC FUNCTION PANEL
ALT: 40 U/L — AB (ref 0–35)
AST: 69 U/L — ABNORMAL HIGH (ref 0–37)
Albumin: 2.6 g/dL — ABNORMAL LOW (ref 3.5–5.2)
Alkaline Phosphatase: 179 U/L — ABNORMAL HIGH (ref 39–117)
BILIRUBIN TOTAL: 0.8 mg/dL (ref 0.3–1.2)
Total Protein: 5.6 g/dL — ABNORMAL LOW (ref 6.0–8.3)

## 2013-10-14 LAB — CBC WITH DIFFERENTIAL/PLATELET
Basophils Absolute: 0 10*3/uL (ref 0.0–0.1)
Basophils Relative: 0 % (ref 0–1)
Eosinophils Absolute: 0 10*3/uL (ref 0.0–0.7)
Eosinophils Relative: 0 % (ref 0–5)
HEMATOCRIT: 31.9 % — AB (ref 36.0–46.0)
HEMOGLOBIN: 10.1 g/dL — AB (ref 12.0–15.0)
LYMPHS PCT: 85 % — AB (ref 12–46)
Lymphs Abs: 46 10*3/uL — ABNORMAL HIGH (ref 0.7–4.0)
MCH: 28.5 pg (ref 26.0–34.0)
MCHC: 31.7 g/dL (ref 30.0–36.0)
MCV: 90.1 fL (ref 78.0–100.0)
MONOS PCT: 1 % — AB (ref 3–12)
Monocytes Absolute: 0.5 10*3/uL (ref 0.1–1.0)
NEUTROS ABS: 7.6 10*3/uL (ref 1.7–7.7)
Neutrophils Relative %: 14 % — ABNORMAL LOW (ref 43–77)
Platelets: 126 10*3/uL — ABNORMAL LOW (ref 150–400)
RBC: 3.54 MIL/uL — AB (ref 3.87–5.11)
RDW: 21.5 % — ABNORMAL HIGH (ref 11.5–15.5)
WBC: 54.1 10*3/uL — AB (ref 4.0–10.5)

## 2013-10-14 LAB — PROTIME-INR
INR: 1.02 (ref 0.00–1.49)
Prothrombin Time: 13.2 seconds (ref 11.6–15.2)

## 2013-10-14 LAB — PHOSPHORUS: PHOSPHORUS: 3 mg/dL (ref 2.3–4.6)

## 2013-10-14 LAB — LACTATE DEHYDROGENASE: LDH: 943 U/L — AB (ref 94–250)

## 2013-10-14 LAB — APTT: aPTT: 21 seconds — ABNORMAL LOW (ref 24–37)

## 2013-10-14 MED ORDER — ALLOPURINOL 300 MG PO TABS
300.0000 mg | ORAL_TABLET | Freq: Two times a day (BID) | ORAL | Status: DC
  Administered 2013-10-14 – 2013-10-15 (×2): 300 mg via ORAL
  Filled 2013-10-14 (×3): qty 1

## 2013-10-14 MED ORDER — LIDOCAINE-EPINEPHRINE (PF) 2 %-1:200000 IJ SOLN
INTRAMUSCULAR | Status: AC
  Filled 2013-10-14: qty 20

## 2013-10-14 MED ORDER — SODIUM CHLORIDE 0.9 % IJ SOLN: 3.0000 mL | INTRAMUSCULAR | Status: DC | PRN

## 2013-10-14 MED ORDER — EPINEPHRINE HCL 0.1 MG/ML IJ SOSY
0.2500 mg | PREFILLED_SYRINGE | Freq: Once | INTRAMUSCULAR | Status: AC | PRN
  Filled 2013-10-14: qty 10

## 2013-10-14 MED ORDER — EPINEPHRINE HCL 1 MG/ML IJ SOLN
0.5000 mg | Freq: Once | INTRAMUSCULAR | Status: AC | PRN
  Filled 2013-10-14: qty 1

## 2013-10-14 MED ORDER — ACETAMINOPHEN 500 MG PO TABS
1000.0000 mg | ORAL_TABLET | Freq: Once | ORAL | Status: AC
  Administered 2013-10-14: 1000 mg via ORAL
  Filled 2013-10-14: qty 2

## 2013-10-14 MED ORDER — MIDAZOLAM HCL 2 MG/2ML IJ SOLN
INTRAMUSCULAR | Status: AC
  Filled 2013-10-14: qty 6

## 2013-10-14 MED ORDER — DIPHENHYDRAMINE HCL 50 MG/ML IJ SOLN: 50.0000 mg | Freq: Once | INTRAMUSCULAR | Status: AC | PRN

## 2013-10-14 MED ORDER — DIPHENHYDRAMINE HCL 50 MG/ML IJ SOLN: 25.0000 mg | Freq: Once | INTRAMUSCULAR | Status: AC | PRN

## 2013-10-14 MED ORDER — SODIUM CHLORIDE 0.9 % IV SOLN: Freq: Once | INTRAVENOUS | Status: AC | PRN

## 2013-10-14 MED ORDER — HEPARIN SOD (PORK) LOCK FLUSH 100 UNIT/ML IV SOLN: 250.0000 [IU] | Freq: Once | INTRAVENOUS | Status: AC | PRN

## 2013-10-14 MED ORDER — SODIUM CHLORIDE 0.9 % IJ SOLN: 10.0000 mL | INTRAMUSCULAR | Status: DC | PRN

## 2013-10-14 MED ORDER — FENTANYL CITRATE 0.05 MG/ML IJ SOLN
INTRAMUSCULAR | Status: AC
  Filled 2013-10-14: qty 6

## 2013-10-14 MED ORDER — FENTANYL CITRATE 0.05 MG/ML IJ SOLN
INTRAMUSCULAR | Status: AC | PRN
  Administered 2013-10-14 (×2): 25 ug via INTRAVENOUS

## 2013-10-14 MED ORDER — IBRUTINIB 140 MG PO CAPS: 420.0000 mg | ORAL_CAPSULE | Freq: Every day | ORAL | Status: DC

## 2013-10-14 MED ORDER — ALTEPLASE 2 MG IJ SOLR
2.0000 mg | Freq: Once | INTRAMUSCULAR | Status: AC | PRN
  Filled 2013-10-14: qty 2

## 2013-10-14 MED ORDER — METHYLPREDNISOLONE SODIUM SUCC 125 MG IJ SOLR
125.0000 mg | Freq: Once | INTRAMUSCULAR | Status: AC | PRN
  Filled 2013-10-14: qty 2

## 2013-10-14 MED ORDER — ALBUTEROL SULFATE (2.5 MG/3ML) 0.083% IN NEBU: 2.5000 mg | INHALATION_SOLUTION | Freq: Once | RESPIRATORY_TRACT | Status: AC | PRN

## 2013-10-14 MED ORDER — DIPHENHYDRAMINE HCL 50 MG PO CAPS
50.0000 mg | ORAL_CAPSULE | Freq: Once | ORAL | Status: AC
  Administered 2013-10-14: 50 mg via ORAL
  Filled 2013-10-14: qty 1

## 2013-10-14 MED ORDER — SODIUM CHLORIDE 0.9 % IV SOLN
300.0000 mg | Freq: Once | INTRAVENOUS | Status: AC
  Administered 2013-10-14: 300 mg via INTRAVENOUS
  Filled 2013-10-14: qty 15

## 2013-10-14 MED ORDER — HEPARIN SOD (PORK) LOCK FLUSH 100 UNIT/ML IV SOLN: 500.0000 [IU] | Freq: Once | INTRAVENOUS | Status: AC | PRN

## 2013-10-14 MED ORDER — FAMOTIDINE IN NACL 20-0.9 MG/50ML-% IV SOLN
20.0000 mg | Freq: Once | INTRAVENOUS | Status: AC | PRN
  Filled 2013-10-14: qty 50

## 2013-10-14 MED ORDER — SODIUM CHLORIDE 0.9 % IV SOLN
Freq: Once | INTRAVENOUS | Status: AC
  Administered 2013-10-14: 13:00:00 via INTRAVENOUS

## 2013-10-14 MED ORDER — MIDAZOLAM HCL 2 MG/2ML IJ SOLN
INTRAMUSCULAR | Status: AC | PRN
  Administered 2013-10-14 (×2): 0.5 mg via INTRAVENOUS
  Administered 2013-10-14: 1 mg via INTRAVENOUS

## 2013-10-14 NOTE — Progress Notes (Signed)
PT Cancellation Note  Patient Details Name: Margaret Mann MRN: [id number] DOB: 02-07-1933   Cancelled Treatment:    Reason Eval/Treat Not Completed: Medical issues which prohibited therapy Pt to have chemo today.  RN states he needs to titrate due to high likelihood of reaction and would be best to cancel PT today.   Sharmain Lastra,KATHrine E 10/14/2013, 10:53 AM Carmelia Bake, PT, DPT 10/14/2013 Pager: [phone]

## 2013-10-14 NOTE — Progress Notes (Signed)
Margaret Mann   DOB:Mar 14, 1933   OA#:[id number]    Subjective: She is feeling well. No evidence of tumor lysis over the weekend. The patient is contemplating about treatment options.  Objective:  Filed Vitals:   10/14/13 1338  BP: 140/64  Pulse: 74  Temp: 97.7 F (36.5 C)  Resp: 20     Intake/Output Summary (Last 24 hours) at 10/14/13 1404 Last data filed at 10/14/13 1323  Gross per 24 hour  Intake 2554.67 ml  Output   2900 ml  Net -345.33 ml    GENERAL:alert, no distress and comfortable SKIN: skin color, texture, turgor are normal, no rashes or significant lesions Musculoskeletal:no cyanosis of digits and no clubbing  NEURO: alert & oriented x 3 with fluent speech, no focal motor/sensory deficits   Labs:  Lab Results  Component Value Date   WBC 54.1* 10/14/2013   HGB 10.1* 10/14/2013   HCT 31.9* 10/14/2013   MCV 90.1 10/14/2013   PLT 126* 10/14/2013   NEUTROABS 7.6 10/14/2013    Lab Results  Component Value Date   NA 140 10/14/2013   K 3.5* 10/14/2013   CL 94* 10/14/2013   CO2 37* 10/14/2013    Studies:  Ir Fluoro Guide Cv Line Right  10/14/2013   INDICATION: CLL, in need of intravenous access for chemotherapy administration  EXAM: IMPLANTED PORT A CATH PLACEMENT WITH ULTRASOUND AND FLUOROSCOPIC GUIDANCE  COMPARISON:  CT CHEST W/CM dated 10/09/2013  MEDICATIONS: Ancef 2 gm IV; IV antibiotic was given in an appropriate time interval prior to skin puncture.  ANESTHESIA/SEDATION: Versed 2 mg IV; Fentanyl 50 mcg IV;  Total Moderate Sedation Time  25  minutes.  CONTRAST:  None  FLUOROSCOPY TIME:  24 seconds.  COMPLICATIONS: None immediate  PROCEDURE: The procedure, risks, benefits, and alternatives were explained to the patient. Questions regarding the procedure were encouraged and answered. The patient understands and consents to the procedure.  The right neck and chest were prepped with chlorhexidine in a sterile fashion, and a sterile drape was applied covering the operative field. Maximum  barrier sterile technique with sterile gowns and gloves were used for the procedure. A timeout was performed prior to the initiation of the procedure. Local anesthesia was provided with 1% lidocaine with epinephrine.  After creating a small venotomy incision, a micropuncture kit was utilized to access the internal jugular vein under direct, real-time ultrasound guidance. Ultrasound image documentation was performed. The microwire was kinked to measure appropriate catheter length.  A subcutaneous port pocket was then created along the upper chest wall, at the site of the patient's prior port a catheter, utilizing a combination of sharp and blunt dissection. The pocket was irrigated with sterile saline. A single lumen ISP power injectable port was chosen for placement. The 8 Fr catheter was tunneled from the port pocket site to the venotomy incision. The port was placed in the pocket. The external catheter was trimmed to appropriate length. At the venotomy, an 8 Fr peel-away sheath was placed over a guidewire under fluoroscopic guidance. The catheter was then placed through the sheath and the sheath was removed. Final catheter positioning was confirmed and documented with a fluoroscopic spot radiograph. The port was accessed with a Huber needle, aspirated and flushed.  The venotomy site was closed with an interrupted 4-0 Vicryl suture. The port pocket incision was closed with interrupted 2-0 Vicryl suture and the skin was opposed with a running subcuticular 4-0 Vicryl suture. Dermabond and Steri-strips were applied to both incisions. The  Port a Catheter was left accessed for impending chemotherapy administration. Dressings were placed. The patient tolerated the procedure well without immediate post procedural complication.  FINDINGS: After catheter placement, the tip lies within the superior cavoatrial junction. The catheter aspirates and flushes normally and is ready for immediate use.  IMPRESSION: Successful  placement of a right internal jugular approach power injectable Port-A-Cath. The catheter is ready for immediate use.   Electronically Signed   By: Sandi Mariscal M.D.   On: 10/14/2013 11:36   Ir US Guide Vasc Access Right  10/14/2013   INDICATION: CLL, in need of intravenous access for chemotherapy administration  EXAM: IMPLANTED PORT A CATH PLACEMENT WITH ULTRASOUND AND FLUOROSCOPIC GUIDANCE  COMPARISON:  CT CHEST W/CM dated 10/09/2013  MEDICATIONS: Ancef 2 gm IV; IV antibiotic was given in an appropriate time interval prior to skin puncture.  ANESTHESIA/SEDATION: Versed 2 mg IV; Fentanyl 50 mcg IV;  Total Moderate Sedation Time  25  minutes.  CONTRAST:  None  FLUOROSCOPY TIME:  24 seconds.  COMPLICATIONS: None immediate  PROCEDURE: The procedure, risks, benefits, and alternatives were explained to the patient. Questions regarding the procedure were encouraged and answered. The patient understands and consents to the procedure.  The right neck and chest were prepped with chlorhexidine in a sterile fashion, and a sterile drape was applied covering the operative field. Maximum barrier sterile technique with sterile gowns and gloves were used for the procedure. A timeout was performed prior to the initiation of the procedure. Local anesthesia was provided with 1% lidocaine with epinephrine.  After creating a small venotomy incision, a micropuncture kit was utilized to access the internal jugular vein under direct, real-time ultrasound guidance. Ultrasound image documentation was performed. The microwire was kinked to measure appropriate catheter length.  A subcutaneous port pocket was then created along the upper chest wall, at the site of the patient's prior port a catheter, utilizing a combination of sharp and blunt dissection. The pocket was irrigated with sterile saline. A single lumen ISP power injectable port was chosen for placement. The 8 Fr catheter was tunneled from the port pocket site to the venotomy  incision. The port was placed in the pocket. The external catheter was trimmed to appropriate length. At the venotomy, an 8 Fr peel-away sheath was placed over a guidewire under fluoroscopic guidance. The catheter was then placed through the sheath and the sheath was removed. Final catheter positioning was confirmed and documented with a fluoroscopic spot radiograph. The port was accessed with a Huber needle, aspirated and flushed.  The venotomy site was closed with an interrupted 4-0 Vicryl suture. The port pocket incision was closed with interrupted 2-0 Vicryl suture and the skin was opposed with a running subcuticular 4-0 Vicryl suture. Dermabond and Steri-strips were applied to both incisions. The Port a Catheter was left accessed for impending chemotherapy administration. Dressings were placed. The patient tolerated the procedure well without immediate post procedural complication.  FINDINGS: After catheter placement, the tip lies within the superior cavoatrial junction. The catheter aspirates and flushes normally and is ready for immediate use.  IMPRESSION: Successful placement of a right internal jugular approach power injectable Port-A-Cath. The catheter is ready for immediate use.   Electronically Signed   By: Sandi Mariscal M.D.   On: 10/14/2013 11:36    Assessment & Plan:  #1 CLL with bulky lymphadenopathy The pattern of persistent leukocytosis and the abnormal CT scan are compatible with disease progression.  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  Overall, the patient has very poor prognosis as her performance status is poor. I had a long discussion with the patient and her next of kin, sister, Margaret Mann and other family members. The patient appeared to be interested to pursue additional therapy. It will take a while to get Ibrutinib approved and delivered. In the meantime, I am planning to start her on Ofatumumab on Monday 4/6. She received high-dose steroids over the weekend, rasburicase and  allopurinol. We discussed the role of chemotherapy. The intent is for palliative.  We discussed some of the risks, benefits and side-effects of Ofatumumab and Irbutinib  Some of the short term side-effects included, though not limited to, risk of fatigue, weight loss, tumor lysis syndrome, risk of allergic reactions, pancytopenia, life-threatening infections, need for transfusions of blood products, nausea, vomiting, change in bowel habits, admission to hospital for various reasons, and risks of death.   The patient is aware that the response rates discussed earlier is not guaranteed.    After a long discussion, patient made an informed decision to proceed with the prescribed plan of care and went ahead to sign the consent form today.   Patient education material was dispensed We will start her on Ofatumumab now, pending approval of a Ibrutinib. I recommend keeping her overnight to monitor for tumor lysis syndrome. If her labs looks okay tomorrow, she can be discharged and to continue the rest of the treatment as an outpatient next week. #2 hypercalcemia, resolved Likely malignant. Continue  Hydration . PTH level is low and this is compatible with malignancy. Will monitor.   #3 weakness and unspecified weight loss  Appreciate Nutrition evaluation. Tolerating Ensure tid well. No appetite stimulants needed   #4 acute renal failure, resolved  resolved with aggressive fluid resuscitation, likely due to recent hypercalcemia  #5 liver metastasis with abnormal liver function test This is related to hepatic metastasis. It is unusual to see hepatic metastasis from CLL, though not impossible. In the scope of things, I would not recommend liver biopsy right now.   #6 recent fall   Appreciate Physical Therapy to assess her strength while hospitalized. She is to be discharged to a skilled nursing facility as she will need 24 hr assistance. The earliest she can be discharged would be next Tuesday on  10/15/2013.  #7 history of DCIS & history of uterine cancer  Recent mammography and biopsy confirmed no evidence of disease recurrence   #8 tumor lysis prophylaxis She will continue on allopurinol and IV fluids.  #9 Abnormal MRI brain Continue observation. Due for another MRI in 6-8 weeks.Likely related to recent fall. Patient had an episode of nosebleed  last week, resolved. Her platelet count is  Stable. Consider following up for potential bleeding, as  acute/subacute hemorrhage was noted on prior film on 3/31.   #10 discharge planning Plan to discharge tomorrow if she has no side effects from treatment  #11 Antimicrobial prophylaxis I will start her on acyclovir 400 mg daily.   [facility], Margaret Karapetian, MD 10/14/2013  2:04 PM

## 2013-10-14 NOTE — Progress Notes (Signed)
[facility] PROGRESS NOTE  Margaret Mann WPV:[id number] DOB: 03-Mar-1933 DOA: 10/07/2013 PCP: Osborne Casco, MD  80 y/o ?, known h/o R breast Ca DCIS s/p R mastectomy + XRt completed rx 09/05/06, h/o IA endometrial cancer status post TAH and BSO on 09/02/2005 status post adjuvant chemotherapy and brachytherapy. Completed treatments July 2010, h/o CLL with 17 P  Likely P 53 related--Rx 6 cycles Bendamustine + Rituxan admitted with falls weakness and confusion Found to have Persistent leukocytosis consistent probably with High risk CLL Scans were perfomred and it was noted that she  Was found to have new 5.2 cm L supraclavicular NOdal mass in sup mediastinum, New Multifocal hepatic mets with significant overall disease progression Patient ultimately decided after long discussions with oncology to undergo treatment knowing fully well that may be a life limiting process She is progressed well and her malignant hypercalcemia is resolved and as this resolved she became less confused and was treated with monoclonal antibodies.    Assessment/Plan: SIRS (systemic inflammatory response syndrome), resolved -WBC's ? likely due to CLL -tachycardia resolved   Acute encephalopathy resolved -due to hypercalcemia, most likely  Cavernous hemangioma with small bleeding-moderate thrombocytopenia -hold heparin products  -SCDs ordered  Hypercalcemia , corrected calcium = has declined -Secondary to malignancy  As PTH LESS than 2.5, was on supplements as well calicium carbonate -TSH WNL, potentially some contribution from hydrochlorothiazide, unlikely vitamin D excess/milk alkali syndrome -received Zometa 4/2-follow calcium daily  AKI  -Secondary to dehydration and continue use of diuretics -d/c diuretics -Aggressive hydration  DCIS right breast since 2007  Endometrial cancer stage IA, grade 3, mixed endometrioid and clear cell ,  CLL initial stage I , 2013  -17 chromosome abnormality on  marrow biopsy. Treatment with 6 cycles of Bendamustine plus Rituxan between 05/03/2012 and 09/21/2012 with a complete response.  -Last WBC approx 1 month ago 13,000  ???44,000 -Patient at high risk for tumor lysis syndrome -rasburicase ordered, Solu-Medrol 125 ordered daily, monitor closely phosphorus uric acid LDH and other labs -Aggressive hydration  IV fluid rate after 100 cc per hour 4/3 -Oncologist to detailed discussions with family about options for treatment which may include Ibrutinib-in the interim Ofatumamab has been started today  Hepatic metastases of unknown etiology -Defer workup to oncologist -LFTs ordered for 4/6 are stable with some trending down and some trending up  Essential hypertension, benign  Continue lisinopril 5 daily.  Stable. -continue holding HCTZ due to dehydration, tendency to cause hypercalcemia  TCP Probably 2/2 to primary process.  Waych platelets and bleeding. Gauic stools if drops below 50, 000  Impaired glucose tolerance Monitor Cover blood sugar if above 180 - 200  HYPERLIPIDEMIA  continue atorvastatin  40 daily on d/c-hold for now   Chronic compensated diastolic dysfunction: Last echo with EF WNL (60%) -continue holding diuretics -continue IVF's low dose + 1.03 l this admit  Moderate protein calorie malnutrition -continue feeding supplements BID -nutrition following patient  Physical deconditioning -PT recommending SNF if unable to 24/7 supervision assistance unable to be provided at discharge -will follow clinical response -pt to re-eval in am as might have met pre-determined goals   Code Status: Full Family Communication: Discussed at bedside with Fogel,Robert  He will update rest of family Disposition Plan: to be determines- likely port placement 4/6 and chemo thereafter   Consultants:  Oncology service  Procedures:  See below for x-ray reports  Antibiotics:  None   HPI/Subjective:  Doing well Admitted Multiple  family members room Tolerating  diet Tolerating chemotherapy      Objective: Filed Vitals:   10/14/13 1540  BP: 143/64  Pulse: 95  Temp: 97.9 F (36.6 C)  Resp: 20    Intake/Output Summary (Last 24 hours) at 10/14/13 1717 Last data filed at 10/14/13 1517  Gross per 24 hour  Intake 4167.34 ml  Output   2950 ml  Net 1217.34 ml   Filed Weights   10/12/13 0545 10/13/13 0509 10/14/13 0500  Weight: 66.1 kg (145 lb 11.6 oz) 66.4 kg (146 lb 6.2 oz) 66.4 kg (146 lb 6.2 oz)    Exam:   General:  Afebrile, feeling better. Much more clear  Cardiovascular: regular rate, no rubs or gallops  Respiratory: CTA bilaterally  Data Reviewed: Basic Metabolic Panel:  Recent Labs Lab 10/09/13 0343 10/10/13 1100 10/12/13 0412 10/13/13 0352 10/14/13 0406  NA 138 135* 143 140 140  K 3.5* 3.5* 3.6* 3.9 3.5*  CL 98 95* 104 97 94*  CO2 _0 34* 37*  GLUCOSE 97 97 116* 122* 124*  BUN _1 CREATININE 0.69 0.65 0.62 0.64 0.65  CALCIUM 10.1 10.1 9.3 9.0 8.8  PHOS  --   --  3.1 2.9 3.0   Liver Function Tests:  Recent Labs Lab 10/08/13 0350 10/09/13 0343 10/10/13 1100 10/12/13 0412 10/14/13 0406  AST 74* 68* 70* 52* 69*  ALT _2 40*  ALKPHOS 184* 184* 202* 183* 179*  BILITOT 1.3* 1.2 1.1 0.8 0.8  PROT 5.6* 6.1 6.5 5.9* 5.6*  ALBUMIN 2.7* 2.7* 2.7* 2.5* 2.6*   CBC:  Recent Labs Lab 10/08/13 0350 10/09/13 0343 10/10/13 0820 10/12/13 0412 10/13/13 0753 10/14/13 0406  WBC 39.7* 50.4* 44.4* 46.7* 59.4* 54.1*  NEUTROABS 9.5* 5.0 8.4*  --  11.3* 7.6  HGB 10.7* 11.3* 11.1* 10.3* 10.7* 10.1*  HCT 33.2* 35.5* 34.2* 32.6* 32.8* 31.9*  MCV 88.3 89.0 88.4 88.8 88.4 90.1  PLT 138* 131* 129* 109* 130* 126*   CBG: No results found for this basename: GLUCAP,  in the last 168 hours  Recent Results (from the past 240 hour(s))  CULTURE, BLOOD (ROUTINE X 2)     Status: None   Collection Time    10/07/13  4:25 PM      Result Value Ref Range Status    Specimen Description BLOOD LEFT HAND   Final   Special Requests BOTTLES DRAWN AEROBIC AND ANAEROBIC 10CC   Final   Culture  Setup Time     Final   Value: 10/07/2013 19:07     Performed at [facility]   Culture     Final   Value: NO GROWTH 5 DAYS     Performed at [facility]   Report Status 10/13/2013 FINAL   Final  CULTURE, BLOOD (ROUTINE X 2)     Status: None   Collection Time    10/07/13  4:33 PM      Result Value Ref Range Status   Specimen Description BLOOD LEFT ARM   Final   Special Requests BOTTLES DRAWN AEROBIC AND ANAEROBIC 10CC   Final   Culture  Setup Time     Final   Value: 10/07/2013 19:07     Performed at [facility]   Culture     Final   Value: NO GROWTH 5 DAYS     Performed at [facility]   Report Status 10/13/2013 FINAL   Final     Studies: Ir  Fluoro Guide Cv Line Right  10/14/2013   INDICATION: CLL, in need of intravenous access for chemotherapy administration  EXAM: IMPLANTED PORT A CATH PLACEMENT WITH ULTRASOUND AND FLUOROSCOPIC GUIDANCE  COMPARISON:  CT CHEST W/CM dated 10/09/2013  MEDICATIONS: Ancef 2 gm IV; IV antibiotic was given in an appropriate time interval prior to skin puncture.  ANESTHESIA/SEDATION: Versed 2 mg IV; Fentanyl 50 mcg IV;  Total Moderate Sedation Time  25  minutes.  CONTRAST:  None  FLUOROSCOPY TIME:  24 seconds.  COMPLICATIONS: None immediate  PROCEDURE: The procedure, risks, benefits, and alternatives were explained to the patient. Questions regarding the procedure were encouraged and answered. The patient understands and consents to the procedure.  The right neck and chest were prepped with chlorhexidine in a sterile fashion, and a sterile drape was applied covering the operative field. Maximum barrier sterile technique with sterile gowns and gloves were used for the procedure. A timeout was performed prior to the initiation of the procedure. Local anesthesia was provided with 1% lidocaine with epinephrine.   After creating a small venotomy incision, a micropuncture kit was utilized to access the internal jugular vein under direct, real-time ultrasound guidance. Ultrasound image documentation was performed. The microwire was kinked to measure appropriate catheter length.  A subcutaneous port pocket was then created along the upper chest wall, at the site of the patient's prior port a catheter, utilizing a combination of sharp and blunt dissection. The pocket was irrigated with sterile saline. A single lumen ISP power injectable port was chosen for placement. The 8 Fr catheter was tunneled from the port pocket site to the venotomy incision. The port was placed in the pocket. The external catheter was trimmed to appropriate length. At the venotomy, an 8 Fr peel-away sheath was placed over a guidewire under fluoroscopic guidance. The catheter was then placed through the sheath and the sheath was removed. Final catheter positioning was confirmed and documented with a fluoroscopic spot radiograph. The port was accessed with a Huber needle, aspirated and flushed.  The venotomy site was closed with an interrupted 4-0 Vicryl suture. The port pocket incision was closed with interrupted 2-0 Vicryl suture and the skin was opposed with a running subcuticular 4-0 Vicryl suture. Dermabond and Steri-strips were applied to both incisions. The Port a Catheter was left accessed for impending chemotherapy administration. Dressings were placed. The patient tolerated the procedure well without immediate post procedural complication.  FINDINGS: After catheter placement, the tip lies within the superior cavoatrial junction. The catheter aspirates and flushes normally and is ready for immediate use.  IMPRESSION: Successful placement of a right internal jugular approach power injectable Port-A-Cath. The catheter is ready for immediate use.   Electronically Signed   By: Sandi Mariscal M.D.   On: 10/14/2013 11:36   Ir US Guide Vasc Access  Right  10/14/2013   INDICATION: CLL, in need of intravenous access for chemotherapy administration  EXAM: IMPLANTED PORT A CATH PLACEMENT WITH ULTRASOUND AND FLUOROSCOPIC GUIDANCE  COMPARISON:  CT CHEST W/CM dated 10/09/2013  MEDICATIONS: Ancef 2 gm IV; IV antibiotic was given in an appropriate time interval prior to skin puncture.  ANESTHESIA/SEDATION: Versed 2 mg IV; Fentanyl 50 mcg IV;  Total Moderate Sedation Time  25  minutes.  CONTRAST:  None  FLUOROSCOPY TIME:  24 seconds.  COMPLICATIONS: None immediate  PROCEDURE: The procedure, risks, benefits, and alternatives were explained to the patient. Questions regarding the procedure were encouraged and answered. The patient understands and consents to the procedure.  The right neck and chest were prepped with chlorhexidine in a sterile fashion, and a sterile drape was applied covering the operative field. Maximum barrier sterile technique with sterile gowns and gloves were used for the procedure. A timeout was performed prior to the initiation of the procedure. Local anesthesia was provided with 1% lidocaine with epinephrine.  After creating a small venotomy incision, a micropuncture kit was utilized to access the internal jugular vein under direct, real-time ultrasound guidance. Ultrasound image documentation was performed. The microwire was kinked to measure appropriate catheter length.  A subcutaneous port pocket was then created along the upper chest wall, at the site of the patient's prior port a catheter, utilizing a combination of sharp and blunt dissection. The pocket was irrigated with sterile saline. A single lumen ISP power injectable port was chosen for placement. The 8 Fr catheter was tunneled from the port pocket site to the venotomy incision. The port was placed in the pocket. The external catheter was trimmed to appropriate length. At the venotomy, an 8 Fr peel-away sheath was placed over a guidewire under fluoroscopic guidance. The catheter was then  placed through the sheath and the sheath was removed. Final catheter positioning was confirmed and documented with a fluoroscopic spot radiograph. The port was accessed with a Huber needle, aspirated and flushed.  The venotomy site was closed with an interrupted 4-0 Vicryl suture. The port pocket incision was closed with interrupted 2-0 Vicryl suture and the skin was opposed with a running subcuticular 4-0 Vicryl suture. Dermabond and Steri-strips were applied to both incisions. The Port a Catheter was left accessed for impending chemotherapy administration. Dressings were placed. The patient tolerated the procedure well without immediate post procedural complication.  FINDINGS: After catheter placement, the tip lies within the superior cavoatrial junction. The catheter aspirates and flushes normally and is ready for immediate use.  IMPRESSION: Successful placement of a right internal jugular approach power injectable Port-A-Cath. The catheter is ready for immediate use.   Electronically Signed   By: Sandi Mariscal M.D.   On: 10/14/2013 11:36    Scheduled Meds: . acyclovir  400 mg Oral Daily  . allopurinol  300 mg Oral BID  . aspirin EC  81 mg Oral Daily  . cholecalciferol  2,000 Units Oral Daily  . feeding supplement (ENSURE COMPLETE)  237 mL Oral BID BM  . lidocaine-EPINEPHrine      . lisinopril  5 mg Oral Daily  . loratadine  10 mg Oral Daily  . methylPREDNISolone (SOLU-MEDROL) injection  125 mg Intravenous Daily  . multivitamin with minerals  1 tablet Oral Daily  . potassium chloride SA  20 mEq Oral Daily  . sodium chloride  3 mL Intravenous Q12H   Continuous Infusions: .  sodium bicarbonate  infusion 1000 mL 100 mL/hr at 10/14/13 1612    Principal Problem:   SIRS (systemic inflammatory response syndrome) Active Problems:   DUCTAL CARCINOMA IN SITU, RIGHT BREAST   HYPERLIPIDEMIA   Essential hypertension, benign   DIASTOLIC DYSFUNCTION   CLL (chronic lymphocytic leukemia)   Uterine  cancer   Dehydration   Hypercalcemia   Malnutrition of moderate degree    Time spent: >30 minutes   Verneita Griffes, MD [facility] (P) [phone]

## 2013-10-14 NOTE — Progress Notes (Signed)
CSW continues to follow for discharge planning. Has a bed @ [facility] when ready. Per chart, pt likely to have  port placement 4/6 and chemo thereafter. CSW updated [facility] and sent updated clinicals to facility.   CSW to continue to follow to assist with pt disposition needs.   Alison Murray, MSW, [facility] [phone]

## 2013-10-14 NOTE — Progress Notes (Signed)
Patient has tolerated chemotherapy without any side effects or signs of reaction.  Margaret [facility]  10/14/2013  7:34 PM

## 2013-10-14 NOTE — Procedures (Signed)
Successful placement of right IJ approach port-a-cath with tip at the superior caval atrial junction. The catheter is ready for immediate use. No immediate post procedural complications. 

## 2013-10-15 ENCOUNTER — Telehealth: Payer: Self-pay | Admitting: Hematology and Oncology

## 2013-10-15 ENCOUNTER — Other Ambulatory Visit: Payer: Self-pay | Admitting: Hematology and Oncology

## 2013-10-15 DIAGNOSIS — C9110 Chronic lymphocytic leukemia of B-cell type not having achieved remission: Secondary | ICD-10-CM

## 2013-10-15 LAB — URINALYSIS, ROUTINE W REFLEX MICROSCOPIC
Bilirubin Urine: NEGATIVE
GLUCOSE, UA: NEGATIVE mg/dL
Hgb urine dipstick: NEGATIVE
Ketones, ur: NEGATIVE mg/dL
Leukocytes, UA: NEGATIVE
Nitrite: NEGATIVE
Protein, ur: NEGATIVE mg/dL
SPECIFIC GRAVITY, URINE: 1.01 (ref 1.005–1.030)
UROBILINOGEN UA: 0.2 mg/dL (ref 0.0–1.0)
pH: 8 (ref 5.0–8.0)

## 2013-10-15 LAB — CBC WITH DIFFERENTIAL/PLATELET
BASOS PCT: 1 % (ref 0–1)
Basophils Absolute: 0.2 10*3/uL — ABNORMAL HIGH (ref 0.0–0.1)
EOS PCT: 0 % (ref 0–5)
Eosinophils Absolute: 0 10*3/uL (ref 0.0–0.7)
HCT: 30.3 % — ABNORMAL LOW (ref 36.0–46.0)
Hemoglobin: 9.8 g/dL — ABNORMAL LOW (ref 12.0–15.0)
LYMPHS PCT: 58 % — AB (ref 12–46)
Lymphs Abs: 9.7 10*3/uL — ABNORMAL HIGH (ref 0.7–4.0)
MCH: 28.5 pg (ref 26.0–34.0)
MCHC: 32.3 g/dL (ref 30.0–36.0)
MCV: 88.1 fL (ref 78.0–100.0)
MONOS PCT: 9 % (ref 3–12)
Monocytes Absolute: 1.5 10*3/uL — ABNORMAL HIGH (ref 0.1–1.0)
Neutro Abs: 5.4 10*3/uL (ref 1.7–7.7)
Neutrophils Relative %: 32 % — ABNORMAL LOW (ref 43–77)
PLATELETS: 133 10*3/uL — AB (ref 150–400)
RBC: 3.44 MIL/uL — AB (ref 3.87–5.11)
RDW: 22.1 % — ABNORMAL HIGH (ref 11.5–15.5)
WBC: 16.8 10*3/uL — AB (ref 4.0–10.5)

## 2013-10-15 LAB — BASIC METABOLIC PANEL
BUN: 16 mg/dL (ref 6–23)
CHLORIDE: 95 meq/L — AB (ref 96–112)
CO2: 37 mEq/L — ABNORMAL HIGH (ref 19–32)
Calcium: 8.3 mg/dL — ABNORMAL LOW (ref 8.4–10.5)
Creatinine, Ser: 0.64 mg/dL (ref 0.50–1.10)
GFR calc Af Amer: >90 mL/min (ref >90)
GFR calc non Af Amer: 83 mL/min — ABNORMAL LOW (ref >90)
GLUCOSE: 144 mg/dL — AB (ref 70–99)
POTASSIUM: 3.7 meq/L (ref 3.7–5.3)
Sodium: 141 mEq/L (ref 137–147)

## 2013-10-15 LAB — URIC ACID: Uric Acid, Serum: 3 mg/dL (ref 2.4–7.0)

## 2013-10-15 LAB — HEPATIC FUNCTION PANEL
ALBUMIN: 2.5 g/dL — AB (ref 3.5–5.2)
ALT: 40 U/L — ABNORMAL HIGH (ref 0–35)
AST: 64 U/L — ABNORMAL HIGH (ref 0–37)
Alkaline Phosphatase: 154 U/L — ABNORMAL HIGH (ref 39–117)
Bilirubin, Direct: <0.2 mg/dL (ref 0.0–0.3)
TOTAL PROTEIN: 5.5 g/dL — AB (ref 6.0–8.3)
Total Bilirubin: 0.6 mg/dL (ref 0.3–1.2)

## 2013-10-15 LAB — LACTATE DEHYDROGENASE: LDH: 943 U/L — ABNORMAL HIGH (ref 94–250)

## 2013-10-15 LAB — PHOSPHORUS: Phosphorus: 3.4 mg/dL (ref 2.3–4.6)

## 2013-10-15 LAB — PROTIME-INR
INR: 1.1 (ref 0.00–1.49)
PROTHROMBIN TIME: 14 s (ref 11.6–15.2)

## 2013-10-15 MED ORDER — ALLOPURINOL 300 MG PO TABS: 300.0000 mg | ORAL_TABLET | Freq: Every day | ORAL | Status: DC

## 2013-10-15 MED ORDER — LIDOCAINE-PRILOCAINE 2.5-2.5 % EX CREA: 1.0000 "application " | TOPICAL_CREAM | CUTANEOUS | Status: DC | PRN

## 2013-10-15 MED ORDER — ACYCLOVIR 400 MG PO TABS: 400.0000 mg | ORAL_TABLET | Freq: Every day | ORAL | Status: DC

## 2013-10-15 MED ORDER — HEPARIN SOD (PORK) LOCK FLUSH 100 UNIT/ML IV SOLN
500.0000 [IU] | INTRAVENOUS | Status: AC | PRN
  Administered 2013-10-15: 500 [IU]
  Filled 2013-10-15: qty 5

## 2013-10-15 NOTE — Progress Notes (Signed)
CSW was following for disposition planning.  RN notified CSW that pt wanting to return home at discharge and not go to SNF for rehab.   Per pt, pt worked with PT this morning and was cleared for home health PT and did not require rehab at [facility].   Pt and pt family agreeable to pt returning home with home health services.  Per MD, pt for discharge home today.  CSW notified RNCM in order for RNCM to arranged home health needs.  CSW notified [facility] that pt will be discharging home and not to SNF.  No further social work needs identified at this time.  CSW signing off.   Alison Murray, MSW, [place] [facility] [phone]

## 2013-10-15 NOTE — Progress Notes (Signed)
Physical Therapy Treatment Patient Details Name: Margaret Mann MRN: [id number] DOB: [birth year] Today's Date: 10/15/2013    History of Present Illness 80 year old female, HPI of 3 primary malignancies (right breast ca, endometrial ca and CLL; mastectomy in 2008) undergoing chemotherapy; PHM hypertension, hyperlipidemia, diastolic dysfunction     PT Comments    Pt ambulated in hallway without assistive device and performed balance activities during gait without LOB.  Pt reports feeling stronger today.  Pt performed standing exercises, steady throughout.  Family friends available to help and lives next door to family.  Pt prefers to return home with HHPT.  Pt feels ready for discharge.  Follow Up Recommendations  Home health PT     Equipment Recommendations  None recommended by PT    Recommendations for Other Services       Precautions / Restrictions Precautions Precautions: Fall Restrictions Weight Bearing Restrictions: No    Mobility  Bed Mobility Overal bed mobility: Needs Assistance Bed Mobility: Supine to Sit     Supine to sit: Supervision     General bed mobility comments: verbal cues for safety, pt already sitting up in bed upon entering  Transfers Overall transfer level: Needs assistance Equipment used: None Transfers: Sit to/from Stand Sit to Stand: Min guard         General transfer comment: verbal cues for safety  Ambulation/Gait Ambulation/Gait assistance: Min guard Ambulation Distance (Feet): 400 Feet Assistive device: None;Straight cane Gait Pattern/deviations: WFL(Within Functional Limits)     General Gait Details: pt reported using IV pole to steady self during ambulation, attempted walking with single point cane, cane removed when apparent pt had good balance during ambulation   Stairs            Wheelchair Mobility    Modified Rankin (Stroke Patients Only)       Balance Overall balance assessment: No apparent balance deficits  (not formally assessed) Sitting-balance support: No upper extremity supported;Feet supported Sitting balance-Leahy Scale: Good     Standing balance support: No upper extremity supported;During functional activity Standing balance-Leahy Scale: Good Standing balance comment: reported unsteadiness but able to maintain balance with ambulatory challenges Single Leg Stance - Right Leg: 18 Single Leg Stance - Left Leg: 30         High level balance activites: Side stepping;Braiding;Sudden stops;Head turns High Level Balance Comments: slight lack of coordination notable during side stepping and braiding however no LOB; able to perform sinlge leg stance with one UE supported, able to maintain balance 18 sec with no UE supported    Cognition Arousal/Alertness: Awake/alert Behavior During Therapy: WFL for tasks assessed/performed Overall Cognitive Status: Impaired/Different from baseline Area of Impairment: Following commands       Following Commands: Follows one step commands consistently;Follows multi-step commands inconsistently;Follows multi-step commands with increased time       General Comments: difficult to draw attention to give instructions when pt's attention is on something else    Exercises General Exercises - Lower Extremity Hip ABduction/ADduction: AROM;Both;15 reps;Standing Hip Flexion/Marching: AROM;Both;15 reps;Standing Mini-Sqauts: AROM;Both;15 reps;Standing Other Exercises Other Exercises: Hip extension, both legs, 15 reps, standing Other Exercises: Knee flexion (open chain), both legs, 15 reps, standing    General Comments        Pertinent Vitals/Pain Activity to tolerance.  Pt complained of no pain.    Home Living Family/patient expects to be discharged to:: Private residence Living Arrangements: Alone Available Help at Discharge: Family (parents involved, sister and brother-in-law next door) Type of Home: [place]  Access: Stairs to enter   Home  Layout: One level Home Equipment: Environmental consultant - 2 wheels;Cane - single point;Bedside commode (brother-in-law lives next door and has DME to borrow)      Prior Function Level of Independence: Independent          PT Goals (current goals can now be found in the care plan section) Acute Rehab PT Goals Patient Stated Goal: return home, continue improving PT Goal Formulation: No goals set, d/c therapy    Frequency  Min 3X/week    PT Plan      Co-evaluation             End of Session Equipment Utilized During Treatment: Gait belt Activity Tolerance: Patient tolerated treatment well Patient left: in chair;with call bell/phone within reach;with family/visitor present     Time: 8022-3361 PT Time Calculation (min): 28 min  Charges:  $Gait Training: 8-22 mins $Therapeutic Exercise: 8-22 mins                    G Codes:      Jacqulyn Cane Oct 23, 2013, 1:45 PM Jacqulyn Cane SPT 10-23-2013

## 2013-10-15 NOTE — Telephone Encounter (Signed)
s/w pt re next appt for 4/13 @ 8:45am. pt to get new schedule when she comes in. not able to add tx after a 10am f/u appt on 4/20 (too late). Per NG she will see pt next wk 4/13 prior to tx - new pof sent 4/7.

## 2013-10-15 NOTE — Progress Notes (Signed)
Margaret Mann   DOB:18-Nov-1932   EH#:[id number]   I have seen the patient, examined her and edited the progress notes as follows Subjective: She is feeling well .Had Port A Cath placement on 4/6 without complications. Tolerated 1st dose of therapy well.  Afebrile. Denies fatigue,nausea, vomiting, change in bowel habits. No respiratory or cardiac complaints. No bleeding issues. No confusion.  Objective:  Filed Vitals:   10/15/13 0614  BP: 182/83  Pulse: 70  Temp: 97.9 F (36.6 C)  Resp: 18     Intake/Output Summary (Last 24 hours) at 10/15/13 0726 Last data filed at 10/15/13 0600  Gross per 24 hour  Intake 3932.67 ml  Output   4103 ml  Net -170.33 ml    GENERAL:alert, no distress and comfortable SKIN: skin color, texture, turgor are normal, no rashes or significant lesions Musculoskeletal:no cyanosis of digits and no clubbing  Lungs: Clear to auscultation Cardio: regular rate and rythm NEURO: alert & oriented x 3 with fluent speech, no focal motor/sensory deficits   Labs:  Lab Results  Component Value Date   WBC 16.8* 10/15/2013   HGB 9.8* 10/15/2013   HCT 30.3* 10/15/2013   MCV 88.1 10/15/2013   PLT 133* 10/15/2013   NEUTROABS 5.4 10/15/2013    Lab Results  Component Value Date   NA 141 10/15/2013   K 3.7 10/15/2013   CL 95* 10/15/2013   CO2 37* 10/15/2013    Studies:   Assessment & Plan:  #1 CLL with bulky lymphadenopathy The pattern of persistent leukocytosis and the abnormal CT scan are compatible with disease progression.  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 Overall, the patient has very poor prognosis as her performance status is poor. Palliative therapy was started on 4/6. It will take a while to get Ibrutinib approved and delivered.  Ofatumumab was initiated on 4/6. She received high-dose steroids over the weekend, rasburicase and allopurinol. No signs of  tumor lysis syndrome. Her WBC today is 16.8 (from 54.1 on 4/6) .  She can be discharged and to continue the rest of  the treatment as an outpatient.  #2 hypercalcemia, resolved Likely malignant. Continue  Hydration . PTH level is low and this is compatible with malignancy. Will monitor. Calcium today is 8.3.    #3 weakness and unspecified weight loss  Appreciate Nutrition evaluation. Tolerating Ensure tid well. No appetite stimulants needed   #4 acute renal failure, resolved  resolved with aggressive fluid resuscitation, likely due to recent hypercalcemia  #5 liver metastasis with abnormal liver function test This is related to hepatic metastasis. It is unusual to see hepatic metastasis from CLL, though not impossible. In the scope of things,would not recommend liver biopsy right now.   #6 recent fall   Appreciate Physical Therapy to assess her strength while hospitalized. Since the patient has marked improvement since hospitalization, she may be able to be discharged home.  #7 history of DCIS & history of uterine cancer  Recent mammography and biopsy confirmed no evidence of disease recurrence   #8 tumor lysis prophylaxis She will continue on allopurinol and IV fluids. I would reduce the dose to 300 mg daily.  #9 Abnormal MRI brain Continue observation. Due for another MRI in 6-8 weeks.Likely related to recent fall. Patient had an episode of nosebleed  last week, resolved. Her platelet count is  Stable. Consider following up for potential bleeding, as  acute/subacute hemorrhage was noted on prior film on 3/31.   #10 discharge planning Plan to discharge  today if she has no side effects from treatment  #11 Antimicrobial prophylaxis On acyclovir 400 mg daily, day 2.   Rondel Jumbo, PA-C 10/15/2013  7:26 AM Davian Hanshaw, MD 10/15/2013

## 2013-10-15 NOTE — Progress Notes (Signed)
Nursing Discharge Summary  Patient ID: Margaret Mann MRN: [id number] DOB/AGE: [birth year] 80 y.o.  Admit date: 10/07/2013 Discharge date: 10/15/2013  Discharged Condition: good  Disposition: 01-Home or Self Care  Follow-up Information   Follow up with [facility]. (home health physical and occupational therapy)    Contact information:   [address] [place] [address] [phone]       Prescriptions Given: N/A Electronically [facility] of Discharge: Home with son via car  Signed: Juanetta Snow 10/15/2013, 11:44 AM

## 2013-10-15 NOTE — Discharge Summary (Signed)
Physician Discharge Summary  Margaret Mann PXT:[id number] DOB: [birth year] DOA: 10/07/2013  PCP: Osborne Casco, MD  Admit date: 10/07/2013 Discharge date: 10/15/2013  Time spent: 45  minutes  Recommendations for Outpatient Follow-up:  1. close followup with oncology 2. Labs in about one week 3. Continued delineation of goals of care  4. Home health ordered for the patient  5. New meds include acyclovir and allopurinol  Discharge Diagnoses:  Principal Problem:   SIRS (systemic inflammatory response syndrome) Active Problems:   DUCTAL CARCINOMA IN SITU, RIGHT BREAST   HYPERLIPIDEMIA   Essential hypertension, benign   DIASTOLIC DYSFUNCTION   CLL (chronic lymphocytic leukemia)   Uterine cancer   Dehydration   Hypercalcemia   Malnutrition of moderate degree   Discharge Condition: Guarded  Diet recommendation: Regular  Filed Weights   10/13/13 0509 10/14/13 0500 10/15/13 0614  Weight: 66.4 kg (146 lb 6.2 oz) 66.4 kg (146 lb 6.2 oz) 66.8 kg (147 lb 4.3 oz)    History of present illness:  80 y/o ?, known h/o R breast Ca DCIS s/p R mastectomy + XRt completed rx 09/05/06, h/o IA endometrial cancer status post TAH and BSO on 09/02/2005 status post adjuvant chemotherapy and brachytherapy. Completed treatments July 2010, h/o CLL with 17 P Likely P 53 related--Rx 6 cycles Bendamustine + Rituxan admitted with falls weakness and confusion  It was ultimately felt that her confusion was secondary to dehydration and hypercalcemia Found to have Persistent leukocytosis consistent probably with High risk CLL  Scans were performred and it was noted that she Was found to have new 5.2 cm L supraclavicular NOdal mass in sup mediastinum, New Multifocal hepatic mets with significant overall disease progression  Patient ultimately decided after long discussions with oncology to undergo treatment knowing fully well that may be a life limiting process  She is progressed well and her malignant  hypercalcemia is resolved and as this resolved she became less confused and was treated with monoclonal antibodies with good effect. Labs are good on the morning of discharge and patient was subsequently discharged on acyclovir and allopurinol as a precaution to prevent tumor lysis syndrome She was also found on this admission to have a new liver lesion in the need followup which is unlikely to be secondary to CLL Dr. Alvy Bimler of oncology saw the patient and will follow her in the outpatient setting closely   Discharge Exam: Filed Vitals:   10/15/13 0614  BP: 182/83  Pulse: 70  Temp: 97.9 F (36.6 C)  Resp: 18    General: EOMI NCAT Cardiovascular: S1-S2 no murmur rub or gallop Respiratory: Clinically clear  Discharge Instructions You were cared for by a hospitalist during your hospital stay. If you have any questions about your discharge medications or the care you received while you were in the hospital after you are discharged, you can call the unit and asked to speak with the hospitalist on call if the hospitalist that took care of you is not available. Once you are discharged, your primary care physician will handle any further medical issues. Please note that NO REFILLS for any discharge medications will be authorized once you are discharged, as it is imperative that you return to your primary care physician (or establish a relationship with a primary care physician if you do not have one) for your aftercare needs so that they can reassess your need for medications and monitor your lab values.  Discharge Orders   Future Appointments Provider Department Dept Phone  10/21/2013 9:15 AM [facility] [phone]   10/21/2013 9:45 AM [facility] [phone]   10/28/2013 9:30 AM [facility] [phone]   10/28/2013 10:00 AM [facility] [phone]   10/28/2013 10:00 AM Heath Lark, MD [facility] [phone]   11/04/2013 9:00 AM [facility] [phone]   11/04/2013 9:30 AM [facility] [phone]   11/11/2013 8:15 AM [facility] [phone]   11/11/2013 8:45 AM [facility] [phone]   11/18/2013 9:00 AM [facility] [phone]   11/18/2013 9:30 AM [facility] [phone]   11/25/2013 9:00 AM [facility] [phone]   11/25/2013 9:30 AM [facility] [phone]   12/03/2013 9:00 AM [facility] [phone]   12/03/2013 9:30 AM [facility] [phone]   Future Orders Complete By Expires   Diet - low sodium heart healthy  As directed    Discharge instructions  As directed    Comments:     Continue new medications as per list NO more calcium Home health will come and help you out at home Get labs at next office visit with Dr. Alvy Bimler   Increase activity slowly  As directed        Medication List    STOP taking these medications       atorvastatin 40 MG tablet  Commonly known as:  LIPITOR     calcium carbonate 600 MG Tabs tablet  Commonly known as:  OS-CAL     hydrochlorothiazide 25 MG tablet  Commonly known as:  HYDRODIURIL     multivitamin capsule     Vitamin D3 2000 UNITS Tabs      TAKE these medications       acyclovir 400 MG tablet  Commonly known as:  ZOVIRAX  Take 1 tablet (400 mg total) by mouth daily.     allopurinol 300 MG tablet  Commonly known as:   ZYLOPRIM  Take 1 tablet (300 mg total) by mouth daily.     aspirin EC 81 MG tablet  Take 81 mg by mouth daily.     cetirizine 10 MG tablet  Commonly known as:  ZYRTEC  Take 10 mg by mouth daily.     cyclobenzaprine 10 MG tablet  Commonly known as:  FLEXERIL  Take 10 mg by mouth at bedtime as needed for muscle spasms.     etodolac 400 MG tablet  Commonly known as:  LODINE  Take 400 mg by mouth 2 (two) times daily as needed (as needed for back pain). With food     lidocaine-prilocaine cream  Commonly known as:  EMLA  Apply 1 application topically as needed.     lisinopril 5 MG tablet  Commonly known as:  PRINIVIL,ZESTRIL  Take 5 mg by mouth daily.     potassium chloride SA 20 MEQ tablet  Commonly known as:  K-DUR,KLOR-CON  Take 20 mEq by mouth daily.     traMADol 50 MG tablet  Commonly known as:  Veatrice Bourbon  Take 1-2 tablets (50-100 mg total) by mouth every 6 (six) hours as needed for pain.       Allergies  Allergen Reactions  . Dilaudid [Hydromorphone Hcl] Nausea And Vomiting       Follow-up Information   Follow up with [facility]. (home health physical and occupational therapy)    Contact information:   [address] [place] [address] [phone]        The results of significant diagnostics from this hospitalization (including imaging, microbiology, ancillary and laboratory) are listed below for reference.    Significant Diagnostic Studies: Dg Chest 2 View  10/07/2013   CLINICAL DATA:  Chest pain, weakness and history of CHF.  EXAM: CHEST - 2 VIEW  COMPARISON:  CT CHEST W/CM dated 07/05/2012; DG CHEST 2 VIEW dated 05/04/2006  FINDINGS: Stable mild chronic lung disease. There is no evidence of pulmonary edema, consolidation, pneumothorax, nodule or pleural fluid. The heart size and mediastinal contours are within normal limits. The bony thorax is unremarkable.  IMPRESSION: No active disease.   Electronically Signed   By: Aletta Edouard M.D.   On: 10/07/2013 15:17   Ct Head Wo Contrast  10/07/2013   CLINICAL DATA:  Confusion, history of bilateral breast cancer  EXAM: CT HEAD WITHOUT CONTRAST  TECHNIQUE: Contiguous axial images were obtained from the base of the skull through the vertex without intravenous contrast.  COMPARISON:  None.  FINDINGS: Calvarium intact. No hemorrhage or extra-axial fluid. No evidence of vascular territory infarct or mass effect. No hydrocephalus. Mild age-related atrophy.  IMPRESSION: No acute findings   Electronically Signed   By: Skipper Cliche M.D.   On: 10/07/2013 13:39   Ct Chest W Contrast  10/09/2013   CLINICAL DATA:  Staging CLL  EXAM: CT CHEST, ABDOMEN, AND PELVIS WITH CONTRAST  TECHNIQUE: Multidetector CT imaging of the chest, abdomen and pelvis was performed following the standard protocol during bolus administration of intravenous contrast.  CONTRAST:  189mL OMNIPAQUE IOHEXOL 300 MG/ML  SOLN  COMPARISON:  07/05/2012  FINDINGS: CT CHEST FINDINGS  3.2 x 5.2 cm left supraclavicular nodal mass (series 2/image 5), arising from the left superior mediastinum/prevascular region (series 2/image 14). This appearance is new/markedly progressed from 2013.  Additional small mediastinal and left axillary nodes, including a 10 mm subpectoral node (series 2/image 14), at the upper limits of normal.  Small left and trace right pleural effusions. Mild dependent atelectasis in the left lower lobe. No suspicious pulmonary nodules. Mild centrilobular emphysematous changes with mild biapical pleural parenchymal scarring. No pneumothorax.  Visualized thyroid is unremarkable.  The heart is normal in size.  No pericardial effusion.  Degenerative changes of the thoracic spine.  CT ABDOMEN AND PELVIS FINDINGS  Extensive upper abdominal/retroperitoneal lymphadenopathy, significantly progressed from 2013, including:  --3.4 cm short axis portacaval node (series 2/ image 60)  --4.9 cm short axis aggregate left para-aortic  nodal mass (series 2/ image 66)  --4.0 cm short axis left jejunal mesenteric nodal mass (series 2/ image 80)  --5.5 cm short axis right jejunal mesenteric nodal mass (series 2/image 80)  Suspected multifocal hypoenhancing hepatic lesions throughout the liver, including:  --1.5 x 1.5 cm lesion centrally in the anterior segment right hepatic lobe (series 2/image 47)  --2.1 x 1.8 cm lesion in the caudate (series 2/image 50)  --1.2 x 1.3 cm lesion in the medial segment left hepatic lobe (series 2/image 51)  --1.4 x 1.4 cm lesion in the anterior segment right hepatic  lobe (series 2/image 59)  Mild splenomegaly, measuring 13.9 cm in maximal craniocaudal dimension. Possible subcentimeter hypoenhancing lesion inferiorly (series 2/image 57), equivocal.  Pancreas and adrenal glands are within normal limits.  Gallbladder is notable for layering gallstone (series 2/image 67), without associated inflammatory changes.  Kidneys are notable for an 11 mm lateral left lower pole renal cyst (series 4/image 39). No hydronephrosis.  No evidence of bowel obstruction.  Normal appendix.  Small volume abdominopelvic ascites.  No evidence of abdominal aortic aneurysm.  Status post hysterectomy.  No adnexal masses.  Bladder is within normal limits.  Degenerative changes of the lumbar spine.  IMPRESSION: 5.2 cm left supraclavicular nodal mass extending from the superior mediastinum, new/increased. Additional small mediastinal/ left axillary nodes.  Extensive upper abdominal/retroperitoneal lymphadenopathy, increased, as described above.  Multifocal hepatic metastases, new, with index lesions as described above.  Mild splenomegaly.  Overall appearance has significantly progressed from 2013.   Electronically Signed   By: Julian Hy M.D.   On: 10/09/2013 17:37   Mr Jeri Cos XF Contrast  10/08/2013   CLINICAL DATA:  New onset altered mental status. History of breast and endometrial cancer. History of leukemia.  EXAM: MRI HEAD WITHOUT AND  WITH CONTRAST  TECHNIQUE: Multiplanar, multiecho pulse sequences of the brain and surrounding structures were obtained without and with intravenous contrast.  CONTRAST:  90mL MULTIHANCE GADOBENATE DIMEGLUMINE 529 MG/ML IV SOLN  COMPARISON:  CT HEAD W/O CM dated 10/07/2013  FINDINGS: A focal area of susceptibility is noted in the posterior left temporal lobe. There is some intrinsic T1 and T2 signal associated with this area suggesting acute/ subacute blood products. There is no enhancement associated with the lesion. No other focal areas of hemorrhage are evident. In retrospect, there is subtle density on the CT scan of the head from yesterday in the same area.  No acute infarct formed other mass lesion is present.  Mild periventricular and subcortical diffuse white matter T2 hyperintensity is evident bilaterally. Mild atrophy is present. The ventricles are of normal size. Flow is present in the major intracranial arteries. The patient is status post bilateral lens replacements. The globes and orbits are otherwise intact. The paranasal sinuses and mastoid air cells are clear.  IMPRESSION: 1. Focal 1 cm area in the left temporal lobe with significant susceptibility and central T1 and T2 hyperintensity compatible with acute/subacute hemorrhage. This may be in the setting of a more remote hemorrhage. The appearance favors that of a cavernous hemangioma width rebleed. There is no enhancement to suggest metastasis. Follow-up MRI in 6-8 weeks without and with contrast would be useful to assure expected evolution of this lesion. 2. No other areas of focal hemorrhage. 3. Mild atrophy and white matter disease. This is nonspecific, but likely reflects the sequela of chronic microvascular ischemia. Critical Value/emergent results were called by telephone at the time of interpretation on 10/08/2013 at 9:39 AM to Dr. Louellen Molder , who verbally acknowledged these results.   Electronically Signed   By: Lawrence Santiago M.D.   On:  10/08/2013 09:39   Ct Abdomen Pelvis W Contrast  10/09/2013   CLINICAL DATA:  Staging CLL  EXAM: CT CHEST, ABDOMEN, AND PELVIS WITH CONTRAST  TECHNIQUE: Multidetector CT imaging of the chest, abdomen and pelvis was performed following the standard protocol during bolus administration of intravenous contrast.  CONTRAST:  157mL OMNIPAQUE IOHEXOL 300 MG/ML  SOLN  COMPARISON:  07/05/2012  FINDINGS: CT CHEST FINDINGS  3.2 x 5.2 cm left supraclavicular nodal mass (  series 2/image 5), arising from the left superior mediastinum/prevascular region (series 2/image 14). This appearance is new/markedly progressed from 2013.  Additional small mediastinal and left axillary nodes, including a 10 mm subpectoral node (series 2/image 14), at the upper limits of normal.  Small left and trace right pleural effusions. Mild dependent atelectasis in the left lower lobe. No suspicious pulmonary nodules. Mild centrilobular emphysematous changes with mild biapical pleural parenchymal scarring. No pneumothorax.  Visualized thyroid is unremarkable.  The heart is normal in size.  No pericardial effusion.  Degenerative changes of the thoracic spine.  CT ABDOMEN AND PELVIS FINDINGS  Extensive upper abdominal/retroperitoneal lymphadenopathy, significantly progressed from 2013, including:  --3.4 cm short axis portacaval node (series 2/ image 60)  --4.9 cm short axis aggregate left para-aortic nodal mass (series 2/ image 66)  --4.0 cm short axis left jejunal mesenteric nodal mass (series 2/ image 80)  --5.5 cm short axis right jejunal mesenteric nodal mass (series 2/image 80)  Suspected multifocal hypoenhancing hepatic lesions throughout the liver, including:  --1.5 x 1.5 cm lesion centrally in the anterior segment right hepatic lobe (series 2/image 47)  --2.1 x 1.8 cm lesion in the caudate (series 2/image 50)  --1.2 x 1.3 cm lesion in the medial segment left hepatic lobe (series 2/image 51)  --1.4 x 1.4 cm lesion in the anterior segment right  hepatic lobe (series 2/image 59)  Mild splenomegaly, measuring 13.9 cm in maximal craniocaudal dimension. Possible subcentimeter hypoenhancing lesion inferiorly (series 2/image 57), equivocal.  Pancreas and adrenal glands are within normal limits.  Gallbladder is notable for layering gallstone (series 2/image 67), without associated inflammatory changes.  Kidneys are notable for an 11 mm lateral left lower pole renal cyst (series 4/image 39). No hydronephrosis.  No evidence of bowel obstruction.  Normal appendix.  Small volume abdominopelvic ascites.  No evidence of abdominal aortic aneurysm.  Status post hysterectomy.  No adnexal masses.  Bladder is within normal limits.  Degenerative changes of the lumbar spine.  IMPRESSION: 5.2 cm left supraclavicular nodal mass extending from the superior mediastinum, new/increased. Additional small mediastinal/ left axillary nodes.  Extensive upper abdominal/retroperitoneal lymphadenopathy, increased, as described above.  Multifocal hepatic metastases, new, with index lesions as described above.  Mild splenomegaly.  Overall appearance has significantly progressed from 2013.   Electronically Signed   By: Julian Hy M.D.   On: 10/09/2013 17:37   Ir Fluoro Guide Cv Line Right  10/14/2013   INDICATION: CLL, in need of intravenous access for chemotherapy administration  EXAM: IMPLANTED PORT A CATH PLACEMENT WITH ULTRASOUND AND FLUOROSCOPIC GUIDANCE  COMPARISON:  CT CHEST W/CM dated 10/09/2013  MEDICATIONS: Ancef 2 gm IV; IV antibiotic was given in an appropriate time interval prior to skin puncture.  ANESTHESIA/SEDATION: Versed 2 mg IV; Fentanyl 50 mcg IV;  Total Moderate Sedation Time  25  minutes.  CONTRAST:  None  FLUOROSCOPY TIME:  24 seconds.  COMPLICATIONS: None immediate  PROCEDURE: The procedure, risks, benefits, and alternatives were explained to the patient. Questions regarding the procedure were encouraged and answered. The patient understands and consents to the  procedure.  The right neck and chest were prepped with chlorhexidine in a sterile fashion, and a sterile drape was applied covering the operative field. Maximum barrier sterile technique with sterile gowns and gloves were used for the procedure. A timeout was performed prior to the initiation of the procedure. Local anesthesia was provided with 1% lidocaine with epinephrine.  After creating a small venotomy incision, a micropuncture kit  was utilized to access the internal jugular vein under direct, real-time ultrasound guidance. Ultrasound image documentation was performed. The microwire was kinked to measure appropriate catheter length.  A subcutaneous port pocket was then created along the upper chest wall, at the site of the patient's prior port a catheter, utilizing a combination of sharp and blunt dissection. The pocket was irrigated with sterile saline. A single lumen ISP power injectable port was chosen for placement. The 8 Fr catheter was tunneled from the port pocket site to the venotomy incision. The port was placed in the pocket. The external catheter was trimmed to appropriate length. At the venotomy, an 8 Fr peel-away sheath was placed over a guidewire under fluoroscopic guidance. The catheter was then placed through the sheath and the sheath was removed. Final catheter positioning was confirmed and documented with a fluoroscopic spot radiograph. The port was accessed with a Huber needle, aspirated and flushed.  The venotomy site was closed with an interrupted 4-0 Vicryl suture. The port pocket incision was closed with interrupted 2-0 Vicryl suture and the skin was opposed with a running subcuticular 4-0 Vicryl suture. Dermabond and Steri-strips were applied to both incisions. The Port a Catheter was left accessed for impending chemotherapy administration. Dressings were placed. The patient tolerated the procedure well without immediate post procedural complication.  FINDINGS: After catheter placement,  the tip lies within the superior cavoatrial junction. The catheter aspirates and flushes normally and is ready for immediate use.  IMPRESSION: Successful placement of a right internal jugular approach power injectable Port-A-Cath. The catheter is ready for immediate use.   Electronically Signed   By: Sandi Mariscal M.D.   On: 10/14/2013 11:36   Ir US Guide Vasc Access Right  10/14/2013   INDICATION: CLL, in need of intravenous access for chemotherapy administration  EXAM: IMPLANTED PORT A CATH PLACEMENT WITH ULTRASOUND AND FLUOROSCOPIC GUIDANCE  COMPARISON:  CT CHEST W/CM dated 10/09/2013  MEDICATIONS: Ancef 2 gm IV; IV antibiotic was given in an appropriate time interval prior to skin puncture.  ANESTHESIA/SEDATION: Versed 2 mg IV; Fentanyl 50 mcg IV;  Total Moderate Sedation Time  25  minutes.  CONTRAST:  None  FLUOROSCOPY TIME:  24 seconds.  COMPLICATIONS: None immediate  PROCEDURE: The procedure, risks, benefits, and alternatives were explained to the patient. Questions regarding the procedure were encouraged and answered. The patient understands and consents to the procedure.  The right neck and chest were prepped with chlorhexidine in a sterile fashion, and a sterile drape was applied covering the operative field. Maximum barrier sterile technique with sterile gowns and gloves were used for the procedure. A timeout was performed prior to the initiation of the procedure. Local anesthesia was provided with 1% lidocaine with epinephrine.  After creating a small venotomy incision, a micropuncture kit was utilized to access the internal jugular vein under direct, real-time ultrasound guidance. Ultrasound image documentation was performed. The microwire was kinked to measure appropriate catheter length.  A subcutaneous port pocket was then created along the upper chest wall, at the site of the patient's prior port a catheter, utilizing a combination of sharp and blunt dissection. The pocket was irrigated with sterile  saline. A single lumen ISP power injectable port was chosen for placement. The 8 Fr catheter was tunneled from the port pocket site to the venotomy incision. The port was placed in the pocket. The external catheter was trimmed to appropriate length. At the venotomy, an 8 Fr peel-away sheath was placed over a guidewire under  fluoroscopic guidance. The catheter was then placed through the sheath and the sheath was removed. Final catheter positioning was confirmed and documented with a fluoroscopic spot radiograph. The port was accessed with a Huber needle, aspirated and flushed.  The venotomy site was closed with an interrupted 4-0 Vicryl suture. The port pocket incision was closed with interrupted 2-0 Vicryl suture and the skin was opposed with a running subcuticular 4-0 Vicryl suture. Dermabond and Steri-strips were applied to both incisions. The Port a Catheter was left accessed for impending chemotherapy administration. Dressings were placed. The patient tolerated the procedure well without immediate post procedural complication.  FINDINGS: After catheter placement, the tip lies within the superior cavoatrial junction. The catheter aspirates and flushes normally and is ready for immediate use.  IMPRESSION: Successful placement of a right internal jugular approach power injectable Port-A-Cath. The catheter is ready for immediate use.   Electronically Signed   By: Sandi Mariscal M.D.   On: 10/14/2013 11:36    Microbiology: Recent Results (from the past 240 hour(s))  CULTURE, BLOOD (ROUTINE X 2)     Status: None   Collection Time    10/07/13  4:25 PM      Result Value Ref Range Status   Specimen Description BLOOD LEFT HAND   Final   Special Requests BOTTLES DRAWN AEROBIC AND ANAEROBIC 10CC   Final   Culture  Setup Time     Final   Value: 10/07/2013 19:07     Performed at [facility]   Culture     Final   Value: NO GROWTH 5 DAYS     Performed at [facility]   Report Status 10/13/2013  FINAL   Final  CULTURE, BLOOD (ROUTINE X 2)     Status: None   Collection Time    10/07/13  4:33 PM      Result Value Ref Range Status   Specimen Description BLOOD LEFT ARM   Final   Special Requests BOTTLES DRAWN AEROBIC AND ANAEROBIC 10CC   Final   Culture  Setup Time     Final   Value: 10/07/2013 19:07     Performed at [facility]   Culture     Final   Value: NO GROWTH 5 DAYS     Performed at [facility]   Report Status 10/13/2013 FINAL   Final     Labs: Basic Metabolic Panel:  Recent Labs Lab 10/10/13 1100 10/12/13 0412 10/13/13 0352 10/14/13 0406 10/15/13 0423  NA 135* 143 140 140 141  K 3.5* 3.6* 3.9 3.5* 3.7  CL 95* 104 97 94* 95*  CO2 25 28 34* 37* 37*  GLUCOSE 97 116* 122* 124* 144*  BUN $Re'9 20 20 16 16  'CaJ$ CREATININE 0.65 0.62 0.64 0.65 0.64  CALCIUM 10.1 9.3 9.0 8.8 8.3*  PHOS  --  3.1 2.9 3.0 3.4   Liver Function Tests:  Recent Labs Lab 10/09/13 0343 10/10/13 1100 10/12/13 0412 10/14/13 0406 10/15/13 0423  AST 68* 70* 52* 69* 64*  ALT $Re'22 25 24 'BHI$ 40* 40*  ALKPHOS 184* 202* 183* 179* 154*  BILITOT 1.2 1.1 0.8 0.8 0.6  PROT 6.1 6.5 5.9* 5.6* 5.5*  ALBUMIN 2.7* 2.7* 2.5* 2.6* 2.5*   No results found for this basename: LIPASE, AMYLASE,  in the last 168 hours No results found for this basename: AMMONIA,  in the last 168 hours CBC:  Recent Labs Lab 10/09/13 0343 10/10/13 0820 10/12/13 0412 10/13/13 0753 10/14/13 0406 10/15/13  0423  WBC 50.4* 44.4* 46.7* 59.4* 54.1* 16.8*  NEUTROABS 5.0 8.4*  --  11.3* 7.6 5.4  HGB 11.3* 11.1* 10.3* 10.7* 10.1* 9.8*  HCT 35.5* 34.2* 32.6* 32.8* 31.9* 30.3*  MCV 89.0 88.4 88.8 88.4 90.1 88.1  PLT 131* 129* 109* 130* 126* 133*   Cardiac Enzymes: No results found for this basename: CKTOTAL, CKMB, CKMBINDEX, TROPONINI,  in the last 168 hours BNP: BNP (last 3 results) No results found for this basename: PROBNP,  in the last 8760 hours CBG: No results found for this basename: GLUCAP,  in the last  168 hours     Signed:  Nita Sells  [facility] 10/15/2013, 12:11 PM

## 2013-10-15 NOTE — Progress Notes (Signed)
I have reviewed this note and agree with all findings. Kati Ramces Shomaker, PT, DPT Pager: [phone]   

## 2013-10-16 NOTE — Care Management Note (Addendum)
    Page 1 of 1   10/15/2013     11:45:58 AM   CARE MANAGEMENT NOTE 10/15/2013  Patient:  MAKALYNN, BERWANGER   Account Number:  [id number]  Date Initiated:  10/15/2013  Documentation initiated by:  [facility]  Subjective/Objective Assessment:   adm: weakness and falls at home     Action/Plan:   discharge planning   Anticipated DC Date:  10/15/2013   Anticipated DC Plan:  [place]  CM consult      [place]   Choice offered to / List presented to:  C-1 Patient        [place] arranged  HH-1 RN  [place].   Status of service:  Completed, signed off Medicare Important Message given?   (If response is "NO", the following Medicare IM given date fields will be blank) Date Medicare IM given:   Date Additional Medicare IM given:    Discharge Disposition:  [place]  Per UR Regulation:    If discussed at Long Length of Stay Meetings, dates discussed:    Comments:  10/15/13 11:40 CM met with pt in room for choice of home health agencies.  Pt chooses [facility] to render HHPT/OT/RN services.  Address and contact numbers verified with pt. No DME requests.  Referral called to [facility] rep, Erasmo Downer.  No other CM needs communicated.  Mariane Masters, BSN, CM [phone].

## 2013-10-21 ENCOUNTER — Other Ambulatory Visit (HOSPITAL_BASED_OUTPATIENT_CLINIC_OR_DEPARTMENT_OTHER): Payer: Medicare Other

## 2013-10-21 ENCOUNTER — Ambulatory Visit (HOSPITAL_BASED_OUTPATIENT_CLINIC_OR_DEPARTMENT_OTHER): Payer: Medicare Other | Admitting: Hematology and Oncology

## 2013-10-21 ENCOUNTER — Other Ambulatory Visit: Payer: Self-pay | Admitting: *Deleted

## 2013-10-21 ENCOUNTER — Ambulatory Visit (HOSPITAL_BASED_OUTPATIENT_CLINIC_OR_DEPARTMENT_OTHER): Payer: Medicare Other

## 2013-10-21 ENCOUNTER — Other Ambulatory Visit: Payer: Self-pay | Admitting: Hematology and Oncology

## 2013-10-21 VITALS — BP 146/69 | HR 75 | Temp 98.0°F | Resp 18

## 2013-10-21 DIAGNOSIS — C9110 Chronic lymphocytic leukemia of B-cell type not having achieved remission: Secondary | ICD-10-CM

## 2013-10-21 DIAGNOSIS — Z5112 Encounter for antineoplastic immunotherapy: Secondary | ICD-10-CM

## 2013-10-21 DIAGNOSIS — R7989 Other specified abnormal findings of blood chemistry: Secondary | ICD-10-CM

## 2013-10-21 DIAGNOSIS — Z853 Personal history of malignant neoplasm of breast: Secondary | ICD-10-CM

## 2013-10-21 DIAGNOSIS — Z8544 Personal history of malignant neoplasm of other female genital organs: Secondary | ICD-10-CM

## 2013-10-21 LAB — CBC WITH DIFFERENTIAL/PLATELET
BASO%: 2.2 % — AB (ref 0.0–2.0)
BASOS ABS: 0.6 10*3/uL — AB (ref 0.0–0.1)
EOS ABS: 0.1 10*3/uL (ref 0.0–0.5)
EOS%: 0.3 % (ref 0.0–7.0)
HEMATOCRIT: 39.6 % (ref 34.8–46.6)
HEMOGLOBIN: 12.8 g/dL (ref 11.6–15.9)
LYMPH%: 55 % — ABNORMAL HIGH (ref 14.0–49.7)
MCH: 28.4 pg (ref 25.1–34.0)
MCHC: 32.3 g/dL (ref 31.5–36.0)
MCV: 87.8 fL (ref 79.5–101.0)
MONO#: 3.2 10*3/uL — AB (ref 0.1–0.9)
MONO%: 12.6 % (ref 0.0–14.0)
NEUT#: 7.5 10*3/uL — ABNORMAL HIGH (ref 1.5–6.5)
NEUT%: 29.9 % — AB (ref 38.4–76.8)
Platelets: 165 10*3/uL (ref 145–400)
RBC: 4.51 10*6/uL (ref 3.70–5.45)
RDW: 23.9 % — ABNORMAL HIGH (ref 11.2–14.5)
WBC: 25.1 10*3/uL — AB (ref 3.9–10.3)
lymph#: 13.8 10*3/uL — ABNORMAL HIGH (ref 0.9–3.3)
nRBC: 3 % — ABNORMAL HIGH (ref 0–0)

## 2013-10-21 LAB — COMPREHENSIVE METABOLIC PANEL (CC13)
ALK PHOS: 330 U/L — AB (ref 40–150)
ALT: 38 U/L (ref 0–55)
AST: 79 U/L — ABNORMAL HIGH (ref 5–34)
Albumin: 3.1 g/dL — ABNORMAL LOW (ref 3.5–5.0)
Anion Gap: 16 mEq/L — ABNORMAL HIGH (ref 3–11)
BILIRUBIN TOTAL: 1.49 mg/dL — AB (ref 0.20–1.20)
BUN: 15.2 mg/dL (ref 7.0–26.0)
CO2: 23 meq/L (ref 22–29)
Calcium: 11.9 mg/dL — ABNORMAL HIGH (ref 8.4–10.4)
Chloride: 96 mEq/L — ABNORMAL LOW (ref 98–109)
Creatinine: 0.8 mg/dL (ref 0.6–1.1)
GLUCOSE: 101 mg/dL (ref 70–140)
Potassium: 5 mEq/L (ref 3.5–5.1)
Sodium: 135 mEq/L — ABNORMAL LOW (ref 136–145)
Total Protein: 7 g/dL (ref 6.4–8.3)

## 2013-10-21 LAB — URIC ACID (CC13): URIC ACID, SERUM: 4.6 mg/dL (ref 2.6–7.4)

## 2013-10-21 LAB — LACTATE DEHYDROGENASE (CC13): LDH: 1494 U/L — ABNORMAL HIGH (ref 125–245)

## 2013-10-21 LAB — TECHNOLOGIST REVIEW

## 2013-10-21 MED ORDER — IBRUTINIB 140 MG PO CAPS
420.0000 mg | ORAL_CAPSULE | Freq: Every day | ORAL | Status: DC
Start: 2013-10-21 — End: 2013-12-27

## 2013-10-21 MED ORDER — DIPHENHYDRAMINE HCL 25 MG PO CAPS
50.0000 mg | ORAL_CAPSULE | Freq: Once | ORAL | Status: AC
  Administered 2013-10-21: 50 mg via ORAL

## 2013-10-21 MED ORDER — HEPARIN SOD (PORK) LOCK FLUSH 100 UNIT/ML IV SOLN
500.0000 [IU] | Freq: Once | INTRAVENOUS | Status: AC | PRN
  Administered 2013-10-21: 500 [IU]
  Filled 2013-10-21: qty 5

## 2013-10-21 MED ORDER — SODIUM CHLORIDE 0.9 % IJ SOLN
10.0000 mL | INTRAMUSCULAR | Status: DC | PRN
  Administered 2013-10-21: 10 mL
  Filled 2013-10-21: qty 10

## 2013-10-21 MED ORDER — METHYLPREDNISOLONE SODIUM SUCC 125 MG IJ SOLR: 125.0000 mg | Freq: Once | INTRAMUSCULAR | Status: AC

## 2013-10-21 MED ORDER — ACETAMINOPHEN 500 MG PO TABS
ORAL_TABLET | ORAL | Status: AC
  Filled 2013-10-21: qty 2

## 2013-10-21 MED ORDER — SODIUM CHLORIDE 0.9 % IV SOLN
1000.0000 mL | INTRAVENOUS | Status: DC
  Administered 2013-10-21: 10:00:00 via INTRAVENOUS

## 2013-10-21 MED ORDER — SODIUM CHLORIDE 0.9 % IV SOLN
Freq: Once | INTRAVENOUS | Status: AC
  Administered 2013-10-21: 10:00:00 via INTRAVENOUS

## 2013-10-21 MED ORDER — ACETAMINOPHEN 500 MG PO TABS
1000.0000 mg | ORAL_TABLET | Freq: Once | ORAL | Status: AC
  Administered 2013-10-21: 1000 mg via ORAL

## 2013-10-21 MED ORDER — SODIUM CHLORIDE 0.9 % IV SOLN
2000.0000 mg | Freq: Once | INTRAVENOUS | Status: AC
  Administered 2013-10-21: 2000 mg via INTRAVENOUS
  Filled 2013-10-21: qty 100

## 2013-10-21 MED ORDER — DIPHENHYDRAMINE HCL 25 MG PO CAPS
ORAL_CAPSULE | ORAL | Status: AC
  Filled 2013-10-21: qty 2

## 2013-10-21 MED ORDER — METHYLPREDNISOLONE SODIUM SUCC 125 MG IJ SOLR
125.0000 mg | Freq: Once | INTRAMUSCULAR | Status: AC
  Administered 2013-10-21: 125 mg via INTRAVENOUS

## 2013-10-21 MED ORDER — METHYLPREDNISOLONE SODIUM SUCC 125 MG IJ SOLR
INTRAMUSCULAR | Status: AC
  Filled 2013-10-21: qty 2

## 2013-10-21 NOTE — Telephone Encounter (Signed)
Brought Rx for Ibrutinib to Margaret Mann for pre cert.  She has already received a Rx last week and is currently working on it.

## 2013-10-21 NOTE — Progress Notes (Signed)
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 female was transferred to my care after her prior physician has left. Please see my recent oncology consult in the hospital dated 10/07/2013. The patient was hospitalized from 10/07/2013 to 10/15/2013 because of recent fall due to weakness. She was also found to have acute renal failure and severe hypercalcemia as well as progressive leukocytosis. CT scan showed significant lymphadenopathy throughout with hypercalcemia. I reviewed her records extensively as documented by Dr. Azucena Freed note dated February 23rd 2015.  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  She also had history of stage I uterine cancer that was treated surgically followed by postoperative chemotherapy and brachytherapy.  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 After her treatment was completed, I did not see a repeat imaging study done. Her blood cell count went back to normal however over the past 6 months, has started to rise.    We started her on Ofatumumab week ago on 10/14/2013 with resolution of her hypercalcemia and improvement of the white blood cell count. Since she was discharged, she still feels weak but overall felt improved. She is eating well. Denies any new lymphadenopathy. MEDICAL HISTORY:  Past Medical History  Diagnosis Date  . Hypertension   . Hyperlipemia   . RBBB   . Lymphocytic leukemia   . Breast cancer     rt lump-08,lt lump 14  .  Arthritis   . Tuberculosis     as a teen-was treated  . CHF (congestive heart failure)     hx 04-cardiac work up 2010-no signs chf  . HOH (hard of hearing)   . Anemia   . Osteopenia     SURGICAL HISTORY: Past Surgical History  Procedure Laterality Date  . Biopsy breast  2008    rt  . Endometrial biopsy  2010  . Appendectomy    . Ganglion cyst excision  2011    left hand  . Diagnostic laparoscopy      node bx  . Abdominal hysterectomy  2010    BSO plus node bx  . Eye surgery      both cataracts  . Breast surgery  2008    rt lump-snbx  . Breast lumpectomy with needle localization Left 04/09/2013    Procedure: BREAST LUMPECTOMY WITH NEEDLE LOCALIZATION;  Surgeon: Edward Jolly, MD;  Location: [place];  Service: General;  Laterality: Left;    SOCIAL HISTORY: History   Social History  . Marital Status: Widowed    Spouse Name: N/A    Number of Children: N/A  . Years of Education: N/A   Occupational History  . Not on file.   Social History Main Topics  . Smoking status: Former Smoker    Quit date: 04/02/1962  . Smokeless tobacco: Not on file     Comment: 1960s   . Alcohol Use: No  . Drug Use: No  . Sexual Activity: Not on file   Other Topics Concern  . Not on file   Social  History Narrative  . No narrative on file    FAMILY HISTORY: Family History  Problem Relation Age of Onset  . Cancer Mother     colon  . Parkinson's disease Mother   . Cancer Father     brain  . Cancer Sister     liver mets  . Hypothyroidism Sister     ALLERGIES:  is allergic to dilaudid.  MEDICATIONS:  Current Outpatient Prescriptions  Medication Sig Dispense Refill  . acyclovir (ZOVIRAX) 400 MG tablet Take 1 tablet (400 mg total) by mouth daily.  30 tablet  3  . allopurinol (ZYLOPRIM) 300 MG tablet Take 1 tablet (300 mg total) by mouth daily.  30 tablet  1  . aspirin EC 81 MG tablet Take 81 mg by mouth daily.      . cetirizine (ZYRTEC) 10 MG tablet  Take 10 mg by mouth daily.      Marland Kitchen lidocaine-prilocaine (EMLA) cream Apply 1 application topically as needed.  30 g  0  . lisinopril (PRINIVIL,ZESTRIL) 5 MG tablet Take 5 mg by mouth daily.      . potassium chloride SA (K-DUR,KLOR-CON) 20 MEQ tablet Take 20 mEq by mouth daily.       No current facility-administered medications for this visit.   Facility-Administered Medications Ordered in Other Visits  Medication Dose Route Frequency Provider Last Rate Last Dose  . 0.9 %  sodium chloride infusion  1,000 mL Intravenous Continuous Iyad Deroo, MD      . heparin lock flush 100 unit/mL  500 Units Intracatheter Once PRN Heath Lark, MD      . sodium chloride 0.9 % injection 10 mL  10 mL Intracatheter PRN Heath Lark, MD        REVIEW OF SYSTEMS:   Constitutional: Denies fevers, chills or abnormal night sweats Eyes: Denies blurriness of vision, double vision or watery eyes Ears, nose, mouth, throat, and face: Denies mucositis or sore throat Respiratory: Denies cough, dyspnea or wheezes Cardiovascular: Denies palpitation, chest discomfort. She complained of chronic bilateral lower extremity swelling Gastrointestinal:  Denies nausea, heartburn or change in bowel habits Skin: Denies abnormal skin rashes Lymphatics: Denies new lymphadenopathy or easy bruising Neurological:Denies numbness, tingling or new weaknesses Behavioral/Psych: Mood is stable, no new changes  All other systems were reviewed with the patient and are negative.  PHYSICAL EXAMINATION: ECOG PERFORMANCE STATUS: 1 - Symptomatic but completely ambulatory  Filed Vitals:   10/21/13 0929  BP: 104/71  Pulse: 120  Temp: 94.3 F (34.6 C)  Resp: 18   Filed Weights   10/21/13 0929  Weight: 138 lb 12.8 oz (62.959 kg)    GENERAL:alert, no distress and comfortable SKIN: skin color, texture, turgor are normal, no rashes or significant lesions EYES: normal, conjunctiva are pink and non-injected, sclera clear OROPHARYNX:no exudate,  normal lips, buccal mucosa, and tongue  NECK: supple, thyroid normal size, non-tender, without nodularity LYMPH:  Palpable lymphadenopathy behind the left supraclavicular region, unchanged  LUNGS: clear to auscultation and percussion with normal breathing effort HEART: regular rate & rhythm and no murmurs without lower extremity edema ABDOMEN:abdomen soft, non-tender and normal bowel sounds Musculoskeletal:no cyanosis of digits and no clubbing  PSYCH: alert & oriented x 3 with fluent speech NEURO: no focal motor/sensory deficits  LABORATORY DATA:  I have reviewed the data as listed Lab Results  Component Value Date   WBC 25.1* 10/21/2013   HGB 12.8 10/21/2013   HCT 39.6 10/21/2013   MCV 87.8 10/21/2013   PLT 165  10/21/2013    Recent Labs  10/12/13 0412 10/13/13 0352 10/14/13 0406 10/15/13 0423 10/21/13 0905  NA 143 140 140 141 135*  K 3.6* 3.9 3.5* 3.7 5.0  CL 104 97 94* 95*  --   CO2 28 34* 37* 37* 23  GLUCOSE 116* 122* 124* 144* 101  BUN 20 20 16 16  15.2  CREATININE 0.62 0.64 0.65 0.64 0.8  CALCIUM 9.3 9.0 8.8 8.3* 11.9*  GFRNONAA 84* 83* 83* 83*  --   GFRAA >90 >90 >90 >90  --   PROT 5.9*  --  5.6* 5.5* 7.0  ALBUMIN 2.5*  --  2.6* 2.5* 3.1*  AST 52*  --  69* 64* 79*  ALT 24  --  40* 40* 38  ALKPHOS 183*  --  179* 154* 330*  BILITOT 0.8  --  0.8 0.6 1.49*  BILIDIR  --   --  <0.2 <0.2  --   IBILI  --   --  NOT CALCULATED NOT CALCULATED  --     ASSESSMENT & PLAN:  #1 CLL with bulky lymphadenopathy  The pattern of persistent leukocytosis and the abnormal CT scan are compatible with disease progression. 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 Overall, the patient has very poor prognosis as her performance status is poor. Palliative therapy was started on 4/6.  It will take a while to get Ibrutinib approved and delivered.  I would continue on treatment for now #2 Recurrent hypercalcemia PTH level is low and this is compatible with malignancy. I will start her on IV  fluids daily as well as corticosteroid treatment and recheck in 2 days. If her hypercalcemia does not improve, the patient is aware she needs to be be admitted to the hospital #3 liver metastasis with abnormal liver function test  This is related to hepatic metastasis. It is unusual to see hepatic metastasis from CLL, though not impossible. In the scope of things,would not recommend liver biopsy right now.  #4 history of DCIS & history of uterine cancer  Recent mammography and biopsy confirmed no evidence of disease recurrence  #5 tumor lysis prophylaxis  She will continue on allopurinol and IV fluids. Continue IV fluids daily. I would discontinue lisinopril.  #6 Recent abnormal MRI brain  Continue observation. Due for another MRI in 6-8 weeks.Likely related to recent fall. Patient had an episode of nosebleed last week, resolved. Her platelet count is Stable.  #7 Antimicrobial prophylaxis  On acyclovir 400 mg daily   All questions were answered. The patient knows to call the clinic with any problems, questions or concerns. I spent 40 minutes counseling the patient face to face. The total time spent in the appointment was 55 minutes and more than 50% was on counseling.     Heath Lark, MD 10/21/2013 1:46 PM

## 2013-10-21 NOTE — Patient Instructions (Signed)
[place] Discharge Instructions for Patients Receiving Chemotherapy  Today you received the following chemotherapy agents: Arzerra   To help prevent nausea and vomiting after your treatment, we encourage you to take your nausea medication as prescribed by your physician.    If you develop nausea and vomiting that is not controlled by your nausea medication, call the clinic.   BELOW ARE SYMPTOMS THAT SHOULD BE REPORTED IMMEDIATELY:  *FEVER GREATER THAN 100.5 F  *CHILLS WITH OR WITHOUT FEVER  NAUSEA AND VOMITING THAT IS NOT CONTROLLED WITH YOUR NAUSEA MEDICATION  *UNUSUAL SHORTNESS OF BREATH  *UNUSUAL BRUISING OR BLEEDING  TENDERNESS IN MOUTH AND THROAT WITH OR WITHOUT PRESENCE OF ULCERS  *URINARY PROBLEMS  *BOWEL PROBLEMS  UNUSUAL RASH Items with * indicate a potential emergency and should be followed up as soon as possible.  Feel free to call the clinic you have any questions or concerns. The clinic phone number is [phone].  Ofatumumab injection What is this medicine? OFATUMUMAB (O fa TOOM ue mab) is a monoclonal antibody. It is used to treat chronic lymphocytic leukemia. In cancer cells, this drug targets a specific protein on cancer cells and stops the cancer cells from growing. This medicine may be used for other purposes; ask your health care provider or pharmacist if you have questions. COMMON BRAND NAME(S): Arzerra What should I tell my health care provider before I take this medicine? They need to know if you have any of these conditions: -hepatitis -low blood counts, like low white cell, platelet, or red cell counts -lung or breathing disease -stomach problems -an unusual or allergic reaction to ofatumumab, other medicines, foods, dyes, or preservatives -pregnant or trying to get pregnant -breast-feeding How should I use this medicine? This medicine is for infusion into a vein. It is given by a health care professional in a hospital or  clinic setting. Talk to your pediatrician regarding the use of this medicine in children. Special care may be needed. Overdosage: If you think you've taken too much of this medicine contact a poison control center or emergency room at once. Overdosage: If you think you have taken too much of this medicine contact a poison control center or emergency room at once. NOTE: This medicine is only for you. Do not share this medicine with others. What if I miss a dose? Keep appointments for follow-up doses as directed. It is important not to miss your dose. Call your doctor or health care professional if you are unable to keep an appointment. What may interact with this medicine? -live virus vaccines This list may not describe all possible interactions. Give your health care provider a list of all the medicines, herbs, non-prescription drugs, or dietary supplements you use. Also tell them if you smoke, drink alcohol, or use illegal drugs. Some items may interact with your medicine. What should I watch for while using this medicine? Report any side effects that you notice during your treatment right away, such as changes in your breathing, fever, chills, dizziness or lightheadedness. These effects are more common with the first dose. Visit your prescriber or health care professional for checks on your progress. You will need to have regular blood work. Report any other side effects. The side effects of this medicine can continue after you finish your treatment. Continue your course of treatment even though you feel ill unless your doctor tells you to stop. Call your doctor or health care professional for advice if you get a fever, chills or  sore throat, or other symptoms of a cold or flu. Do not treat yourself. This drug decreases your body's ability to fight infections. Try to avoid being around people who are sick. This medicine may increase your risk to bruise or bleed. Call your doctor or health care  professional if you notice any unusual bleeding. Be careful brushing and flossing your teeth or using a toothpick because you may get an infection or bleed more easily. If you have any dental work done, tell your dentist you are receiving this medicine. Avoid taking products that contain aspirin, acetaminophen, ibuprofen, naproxen, or ketoprofen unless instructed by your doctor. These medicines may hide a fever. Do not become pregnant while taking this medicine. Women should inform their doctor if they wish to become pregnant or think they might be pregnant. There is a potential for serious side effects to an unborn child. Talk to your health care professional or pharmacist for more information. Do not breast-feed an infant while taking this medicine. What side effects may I notice from receiving this medicine? Side effects that you should report to your doctor or health care professional as soon as possible: -allergic reactions like skin rash, itching or hives, swelling of the face, lips, or tongue -breathing problems -changes in vision -confusion, trouble speaking or understanding -cough -fainting spells -fever or chills -general ill feeling or flu-like symptoms -lightheadedness -loss of appetite, nausea -low blood counts - this medicine may decrease the number of white blood cells, red blood cells and platelets. You may be at increased risk for infections and bleeding. -right upper belly pain -signs of decreased platelets or bleeding - bruising, pinpoint red spots on the skin, black, tarry stools, blood in the urine -sore throat -stomach pain -trouble walking, dizziness, loss of balance or coordination -unusual bleeding or bruising -unusually weak or tired -yellowing of the eyes or skin Side effects that usually do not require medical attention (Report these to your doctor or health care professional if they continue or are bothersome.): -difficulty sleeping -headache -swelling of the  legs or ankles This list may not describe all possible side effects. Call your doctor for medical advice about side effects. You may report side effects to FDA at 1-800-FDA-1088. Where should I keep my medicine? This drug is only given in a hospital or clinic and will not be stored at home. NOTE: This sheet is a summary. It may not cover all possible information. If you have questions about this medicine, talk to your doctor, pharmacist, or health care provider.  2014, Elsevier/Gold Standard. (2012-03-26 17:21:12)

## 2013-10-21 NOTE — Progress Notes (Signed)
Per Dr. Alvy Bimler, okay to tx after reviewing today's labs. Patient to get 1L NS over 2 hrs today. Patient tolerated infusion well.

## 2013-10-21 NOTE — Progress Notes (Signed)
SENT RX TO [facility].  THEY NEEDED A PA.  10/17/13 HAD TO CALL PATIENT TO SEE WHAT HER ID WAS.

## 2013-10-22 ENCOUNTER — Other Ambulatory Visit: Payer: Self-pay | Admitting: Hematology and Oncology

## 2013-10-22 ENCOUNTER — Ambulatory Visit (HOSPITAL_BASED_OUTPATIENT_CLINIC_OR_DEPARTMENT_OTHER): Payer: Medicare Other

## 2013-10-22 ENCOUNTER — Encounter: Payer: Self-pay | Admitting: *Deleted

## 2013-10-22 DIAGNOSIS — C9110 Chronic lymphocytic leukemia of B-cell type not having achieved remission: Secondary | ICD-10-CM

## 2013-10-22 MED ORDER — METHYLPREDNISOLONE SODIUM SUCC 125 MG IJ SOLR
INTRAMUSCULAR | Status: AC
  Filled 2013-10-22: qty 2

## 2013-10-22 MED ORDER — SODIUM CHLORIDE 0.9 % IJ SOLN
10.0000 mL | INTRAMUSCULAR | Status: DC | PRN
  Administered 2013-10-22: 10 mL via INTRAVENOUS
  Filled 2013-10-22: qty 10

## 2013-10-22 MED ORDER — SODIUM CHLORIDE 0.9 % IV SOLN
INTRAVENOUS | Status: DC
  Administered 2013-10-22: 15:00:00 via INTRAVENOUS

## 2013-10-22 MED ORDER — HEPARIN SOD (PORK) LOCK FLUSH 100 UNIT/ML IV SOLN
500.0000 [IU] | Freq: Once | INTRAVENOUS | Status: AC
  Administered 2013-10-22: 500 [IU] via INTRAVENOUS
  Filled 2013-10-22: qty 5

## 2013-10-22 MED ORDER — SODIUM CHLORIDE 0.9 % IV SOLN
INTRAVENOUS | Status: DC
Start: 2013-10-22 — End: 2013-10-22

## 2013-10-22 MED ORDER — SODIUM CHLORIDE 0.9 % IV SOLN: INTRAVENOUS | Status: DC

## 2013-10-22 MED ORDER — METHYLPREDNISOLONE SODIUM SUCC 125 MG IJ SOLR
125.0000 mg | Freq: Every day | INTRAMUSCULAR | Status: DC
  Administered 2013-10-22: 125 mg via INTRAVENOUS

## 2013-10-22 NOTE — Patient Instructions (Signed)
Dehydration, Adult Dehydration is when you lose more fluids from the body than you take in. Vital organs like the kidneys, brain, and heart cannot function without a proper amount of fluids and salt. Any loss of fluids from the body can cause dehydration.  CAUSES   Vomiting.  Diarrhea.  Excessive sweating.  Excessive urine output.  Fever. SYMPTOMS  Mild dehydration  Thirst.  Dry lips.  Slightly dry mouth. Moderate dehydration  Very dry mouth.  Sunken eyes.  Skin does not bounce back quickly when lightly pinched and released.  Dark urine and decreased urine production.  Decreased tear production.  Headache. Severe dehydration  Very dry mouth.  Extreme thirst.  Rapid, weak pulse (more than 100 beats per minute at rest).  Cold hands and feet.  Not able to sweat in spite of heat and temperature.  Rapid breathing.  Blue lips.  Confusion and lethargy.  Difficulty being awakened.  Minimal urine production.  No tears. DIAGNOSIS  Your caregiver will diagnose dehydration based on your symptoms and your exam. Blood and urine tests will help confirm the diagnosis. The diagnostic evaluation should also identify the cause of dehydration. TREATMENT  Treatment of mild or moderate dehydration can often be done at home by increasing the amount of fluids that you drink. It is best to drink small amounts of fluid more often. Drinking too much at one time can make vomiting worse. Refer to the home care instructions below. Severe dehydration needs to be treated at the hospital where you will probably be given intravenous (IV) fluids that contain water and electrolytes. HOME CARE INSTRUCTIONS   Ask your caregiver about specific rehydration instructions.  Drink enough fluids to keep your urine clear or pale yellow.  Drink small amounts frequently if you have nausea and vomiting.  Eat as you normally do.  Avoid:  Foods or drinks high in sugar.  Carbonated  drinks.  Juice.  Extremely hot or cold fluids.  Drinks with caffeine.  Fatty, greasy foods.  Alcohol.  Tobacco.  Overeating.  Gelatin desserts.  Wash your hands well to avoid spreading bacteria and viruses.  Only take over-the-counter or prescription medicines for pain, discomfort, or fever as directed by your caregiver.  Ask your caregiver if you should continue all prescribed and over-the-counter medicines.  Keep all follow-up appointments with your caregiver. SEEK MEDICAL CARE IF:  You have abdominal pain and it increases or stays in one area (localizes).  You have a rash, stiff neck, or severe headache.  You are irritable, sleepy, or difficult to awaken.  You are weak, dizzy, or extremely thirsty. SEEK IMMEDIATE MEDICAL CARE IF:   You are unable to keep fluids down or you get worse despite treatment.  You have frequent episodes of vomiting or diarrhea.  You have blood or green matter (bile) in your vomit.  You have blood in your stool or your stool looks black and tarry.  You have not urinated in 6 to 8 hours, or you have only urinated a small amount of very dark urine.  You have a fever.  You faint. MAKE SURE YOU:   Understand these instructions.  Will watch your condition.  Will get help right away if you are not doing well or get worse. Document Released: 06/27/2005 Document Revised: 09/19/2011 Document Reviewed: 02/14/2011 ExitCare Patient Information 2014 [employer].  

## 2013-10-22 NOTE — Progress Notes (Signed)
Per Dannielle Huh,  Irubtinib ready to pick up at [facility] and co-pay is $50.  Notified pt and her friend in Infusion room.  Literature on Irubtinib given to pt.  Instructed to pick up Medication today and start in the morning.  Pt verbalized understanding.

## 2013-10-23 ENCOUNTER — Ambulatory Visit (HOSPITAL_BASED_OUTPATIENT_CLINIC_OR_DEPARTMENT_OTHER): Payer: Medicare Other

## 2013-10-23 ENCOUNTER — Other Ambulatory Visit: Payer: Self-pay | Admitting: Hematology and Oncology

## 2013-10-23 ENCOUNTER — Telehealth: Payer: Self-pay | Admitting: Hematology and Oncology

## 2013-10-23 ENCOUNTER — Telehealth: Payer: Self-pay | Admitting: *Deleted

## 2013-10-23 VITALS — BP 154/85 | HR 90 | Temp 96.7°F | Resp 16

## 2013-10-23 DIAGNOSIS — Z95828 Presence of other vascular implants and grafts: Secondary | ICD-10-CM

## 2013-10-23 DIAGNOSIS — C9110 Chronic lymphocytic leukemia of B-cell type not having achieved remission: Secondary | ICD-10-CM

## 2013-10-23 LAB — MANUAL DIFFERENTIAL
ALC: 23.9 10*3/uL — ABNORMAL HIGH (ref 0.9–3.3)
ANC (CHCC manual diff): 10.1 10*3/uL — ABNORMAL HIGH (ref 1.5–6.5)
BAND NEUTROPHILS: 0 % (ref 0–10)
BASOPHIL: 0 % (ref 0–2)
BLASTS: 0 % (ref 0–0)
EOS: 1 % (ref 0–7)
LYMPH: 66 % — ABNORMAL HIGH (ref 14–49)
MONO: 5 % (ref 0–14)
Metamyelocytes: 0 % (ref 0–0)
Myelocytes: 1 % — ABNORMAL HIGH (ref 0–0)
NRBC: 2 % — AB (ref 0–0)
Other Cell: 0 % (ref 0–0)
PLT EST: DECREASED
PROMYELO: 0 % (ref 0–0)
SEG: 27 % — ABNORMAL LOW (ref 38–77)
Variant Lymph: 0 % (ref 0–0)

## 2013-10-23 LAB — COMPREHENSIVE METABOLIC PANEL (CC13)
ALT: 27 U/L (ref 0–55)
AST: 95 U/L — ABNORMAL HIGH (ref 5–34)
Albumin: 2.9 g/dL — ABNORMAL LOW (ref 3.5–5.0)
Alkaline Phosphatase: 369 U/L — ABNORMAL HIGH (ref 40–150)
Anion Gap: 11 mEq/L (ref 3–11)
BILIRUBIN TOTAL: 1.45 mg/dL — AB (ref 0.20–1.20)
BUN: 22.9 mg/dL (ref 7.0–26.0)
CO2: 26 mEq/L (ref 22–29)
CREATININE: 0.7 mg/dL (ref 0.6–1.1)
Calcium: 10.6 mg/dL — ABNORMAL HIGH (ref 8.4–10.4)
Chloride: 96 mEq/L — ABNORMAL LOW (ref 98–109)
GLUCOSE: 136 mg/dL (ref 70–140)
Potassium: 3.8 mEq/L (ref 3.5–5.1)
Sodium: 133 mEq/L — ABNORMAL LOW (ref 136–145)
Total Protein: 6.2 g/dL — ABNORMAL LOW (ref 6.4–8.3)

## 2013-10-23 LAB — CBC WITH DIFFERENTIAL/PLATELET
HEMATOCRIT: 34.2 % — AB (ref 34.8–46.6)
HGB: 10.9 g/dL — ABNORMAL LOW (ref 11.6–15.9)
MCH: 28.1 pg (ref 25.1–34.0)
MCHC: 31.8 g/dL (ref 31.5–36.0)
MCV: 88.2 fL (ref 79.5–101.0)
PLATELETS: 107 10*3/uL — AB (ref 145–400)
RBC: 3.88 10*6/uL (ref 3.70–5.45)
RDW: 24 % — ABNORMAL HIGH (ref 11.2–14.5)
WBC: 36.2 10*3/uL — AB (ref 3.9–10.3)

## 2013-10-23 LAB — LACTATE DEHYDROGENASE (CC13): LDH: 3692 U/L — ABNORMAL HIGH (ref 125–245)

## 2013-10-23 LAB — URIC ACID (CC13): URIC ACID, SERUM: 4.1 mg/dL (ref 2.6–7.4)

## 2013-10-23 MED ORDER — SODIUM CHLORIDE 0.9 % IV SOLN
INTRAVENOUS | Status: DC
  Administered 2013-10-23: 13:00:00 via INTRAVENOUS

## 2013-10-23 MED ORDER — FUROSEMIDE 20 MG PO TABS
20.0000 mg | ORAL_TABLET | Freq: Every day | ORAL | Status: DC
Start: 2013-10-23 — End: 2013-11-04

## 2013-10-23 MED ORDER — SODIUM CHLORIDE 0.9 % IJ SOLN
10.0000 mL | INTRAMUSCULAR | Status: DC | PRN
  Administered 2013-10-23 (×2): 10 mL via INTRAVENOUS
  Filled 2013-10-23: qty 10

## 2013-10-23 MED ORDER — METHYLPREDNISOLONE SODIUM SUCC 125 MG IJ SOLR
125.0000 mg | Freq: Every day | INTRAMUSCULAR | Status: DC
  Administered 2013-10-23: 125 mg via INTRAVENOUS

## 2013-10-23 MED ORDER — METHYLPREDNISOLONE SODIUM SUCC 125 MG IJ SOLR
INTRAMUSCULAR | Status: AC
  Filled 2013-10-23: qty 2

## 2013-10-23 NOTE — Telephone Encounter (Signed)
Message copied by Cathlean Cower on Wed Oct 23, 2013  2:43 PM ------      Message from: [facility], [place]: Wed Oct 23, 2013  1:25 PM      Regarding: RE: Lisinopril       No lisinopril      We can call in lasix 20 mg po daily 30 tabs no refill, take daily in the monrning      ----- Message -----         From: Cathlean Cower, RN         Sent: 10/23/2013   1:16 PM           To: Heath Lark, MD      Subject: Lisinopril                                               Pt in infusion,  Asks if she should restart her Lisinopril?  Her BP today is 146/84.        Also there are orders in for Solu Medrol for the rest of the week.  But they don't see any further IVF orders.  They did have orders for today.        ------

## 2013-10-23 NOTE — Progress Notes (Signed)
Patient started the [place] today.  Per Cameo (RN at desk) - she can use tylenol for her back pain.  Patient is asking if she still needs to hold her blood pressure medication.  Cameo will check on this.

## 2013-10-23 NOTE — Telephone Encounter (Signed)
Informed Kristen, Infusion RN to instruct pt to not resume lisinopril and to start lasix 20 mg daily .Rx for lasix sent to [facility] on [address].

## 2013-10-23 NOTE — Patient Instructions (Signed)

## 2013-10-23 NOTE — Patient Instructions (Addendum)
Do not resume lisinopril. Dr. Alvy Bimler is going to start you on Lasix once a day in the morning. It has been called into your pharmacy.  Hypercalcemia Hypercalcemia means the calcium in your blood is too high. Calcium in our blood is important for the control of many things, such as:  Blood clotting.  Conducting of nerve impulses.  Muscle contraction.  Maintaining teeth and bone health.  Other body functions. In the bloodstream, calcium maintains a constant balance with another mineral, phosphate. Calcium is absorbed into the body through the small intestine. This is helped by Vitamin D. Calcium levels are maintained mostly by vitamin D and a hormone (parathyroid hormone). But the kidneys also help. Hypercalcemia can happen when the concentration of calcium is too high for the kidneys to maintain balance. The body maintains a balance between the calcium we eat and the calcium already in our body. If calcium intake is increased or we cannot use calcium properly, there may be problems. Some common sources of calcium are:   Dairy products.  Nuts.  Eggs.  Whole grains.  Legumes.  Green leafy vegetables. CAUSES There are many causes of this condition, but some common ones are:  Hyperparathyroidism. This is an over activity of the parathyroid gland.  Cancers of the breast, kidney, lung, head and neck are common causes of calcium increases.  Medications that cause you to urinate more often (diuretics), nausea, vomiting and diarrhea also increase the calcium in the blood.  Overuse of calcium-containing antacids. SYMPTOMS  Many patients with mild hypercalcemia have no symptoms. For those with symptoms common problems include:  Loss of appetite.  Constipation.  Increased thirst.  Heart rhythm changes.  Abnormal thinking.  Nausea.  Abdominal pain.  Kidney stones.  Mood swings.  Coma and death when severe.  Vomiting.  Increased urination.  High blood  pressure.  Confusion. DIAGNOSIS   Your caregiver will do a medical history and perform a physical exam on you.  Calcium and parathyroid hormone (PTH) may be measured with a blood test. TREATMENT   The treatment depends on the calcium level and what is causing the higher level. Hypercalcemia can be lifethreatening. Fast lowering of the calcium level may be necessary.  With normal kidney function, fluids can be given by vein to clear the excess calcium. Hemodialysis works well to reduce dangerous calcium levels if there is poor kidney function. This is a procedure in which a machine is used to filter out unwanted substances. The blood is then returned to the body.  Drugs, such as diuretics, can be given after adequate fluid intake is established. These medications help the kidneys get rid of extra calcium. Drugs that lessen (inhibit) bone loss are helpful in gaining long-term control. Phosphate pills help lower high calcium levels caused by a low supply of phosphate. Anti-inflammatory agents such as steroids are helpful with some cancers and toxic levels of vitamin D.  Treatment of the underlying cause of the hypercalcemia will also correct the imbalance. Hyperparathyroidism is usually treated by surgical removal of one or more of the parathyroid glands and any tissue, other than the glands themselves, that is producing too much hormone.  The hypercalcemia caused by cancer is difficult to treat without controlling the cancer. Symptoms can be improved with fluids and drug therapy as outlined above. PROGNOSIS   Surgery to remove the parathyroid glands is usually successful. This also depends on the amount of damage to the kidneys and whether or not it can be treated.  Mild  hypercalcemia can be controlled with good fluid intake and the use of effective medications.  Hypercalcemia often develops as a late complication of cancer. The expected outlook is poor without effective anticancer  therapy. PREVENTION   If you are at risk for developing hypercalcemia, be familiar with early symptoms. Report these to your caregiver.  Good fluid intake (up to four quarts of liquid a day if possible) is helpful.  Try to control nausea and vomiting, and treat fevers to avoid dehydration.  Lowering the amount of calcium in your diet is not necessary. High blood calcium reduces absorption of calcium in the intestine.  Stay as active as possible. SEEK IMMEDIATE MEDICAL CARE IF:   You develop chest pain, sweating, or shortness of breath.  You get confused, feel faint or pass out.  You develop severe nausea and vomiting. MAKE SURE YOU:   Understand these instructions.  Will watch your condition.  Will get help right away if you are not doing well or get worse. Document Released: 09/10/2004 Document Revised: 10/22/2012 Document Reviewed: 06/22/2010 [facility] Patient Information 2014 [place], [place].

## 2013-10-23 NOTE — Telephone Encounter (Signed)
per staff message from NG she will see pt @ 915am on 4/20. pt given new schedule today while in inf.

## 2013-10-24 ENCOUNTER — Other Ambulatory Visit: Payer: Self-pay | Admitting: *Deleted

## 2013-10-24 ENCOUNTER — Ambulatory Visit (HOSPITAL_BASED_OUTPATIENT_CLINIC_OR_DEPARTMENT_OTHER): Payer: Medicare Other

## 2013-10-24 ENCOUNTER — Ambulatory Visit: Payer: Medicare Other | Admitting: Hematology and Oncology

## 2013-10-24 VITALS — BP 142/78 | HR 87 | Temp 97.8°F

## 2013-10-24 DIAGNOSIS — C9110 Chronic lymphocytic leukemia of B-cell type not having achieved remission: Secondary | ICD-10-CM

## 2013-10-24 MED ORDER — METHYLPREDNISOLONE SODIUM SUCC 125 MG IJ SOLR
125.0000 mg | Freq: Every day | INTRAMUSCULAR | Status: DC
  Administered 2013-10-24: 125 mg via INTRAVENOUS

## 2013-10-24 MED ORDER — SODIUM CHLORIDE 0.9 % IV SOLN
Freq: Once | INTRAVENOUS | Status: AC
  Administered 2013-10-24: 14:00:00 via INTRAVENOUS

## 2013-10-24 MED ORDER — METHYLPREDNISOLONE SODIUM SUCC 125 MG IJ SOLR
INTRAMUSCULAR | Status: AC
  Filled 2013-10-24: qty 2

## 2013-10-24 NOTE — Patient Instructions (Signed)

## 2013-10-24 NOTE — Progress Notes (Signed)
Per Dr Alvy Bimler, pt will need 1 L normal saline over 2 hours and 125 mg solu-medrol daily until Saturday 10/26/13.  SLJ

## 2013-10-25 ENCOUNTER — Other Ambulatory Visit: Payer: Self-pay | Admitting: *Deleted

## 2013-10-25 ENCOUNTER — Ambulatory Visit (HOSPITAL_BASED_OUTPATIENT_CLINIC_OR_DEPARTMENT_OTHER): Payer: Medicare Other

## 2013-10-25 VITALS — BP 191/76 | HR 75 | Temp 98.2°F | Resp 20

## 2013-10-25 DIAGNOSIS — C9110 Chronic lymphocytic leukemia of B-cell type not having achieved remission: Secondary | ICD-10-CM

## 2013-10-25 DIAGNOSIS — R030 Elevated blood-pressure reading, without diagnosis of hypertension: Secondary | ICD-10-CM

## 2013-10-25 MED ORDER — SODIUM CHLORIDE 0.9 % IV SOLN
Freq: Once | INTRAVENOUS | Status: AC
  Administered 2013-10-25: 14:00:00 via INTRAVENOUS

## 2013-10-25 MED ORDER — FUROSEMIDE 10 MG/ML IJ SOLN
20.0000 mg | Freq: Once | INTRAMUSCULAR | Status: AC
  Administered 2013-10-25: 20 mg via INTRAVENOUS

## 2013-10-25 MED ORDER — HEPARIN SOD (PORK) LOCK FLUSH 100 UNIT/ML IV SOLN
500.0000 [IU] | Freq: Once | INTRAVENOUS | Status: AC
  Administered 2013-10-25: 500 [IU] via INTRAVENOUS
  Filled 2013-10-25: qty 5

## 2013-10-25 MED ORDER — SODIUM CHLORIDE 0.9 % IJ SOLN
10.0000 mL | INTRAMUSCULAR | Status: DC | PRN
  Administered 2013-10-25: 10 mL via INTRAVENOUS
  Filled 2013-10-25: qty 10

## 2013-10-25 MED ORDER — METHYLPREDNISOLONE SODIUM SUCC 125 MG IJ SOLR
125.0000 mg | Freq: Every day | INTRAMUSCULAR | Status: DC
  Administered 2013-10-25: 125 mg via INTRAVENOUS

## 2013-10-25 MED ORDER — METHYLPREDNISOLONE SODIUM SUCC 125 MG IJ SOLR
INTRAMUSCULAR | Status: AC
  Filled 2013-10-25: qty 2

## 2013-10-25 NOTE — Progress Notes (Signed)
Patient states she started taking a new medication 3 days ago and developed night sweats the past two nights. Patient stated she noticed numbness and tingling around her lips this morning.Dr. Alvy Bimler notified. Patient instructed and verbalized understanding that her night sweats are from the CLL and the numbness and tingling around the mouth is due to her calcium level per Dr. Alvy Bimler.

## 2013-10-25 NOTE — Patient Instructions (Signed)
Dehydration, Adult Dehydration is when you lose more fluids from the body than you take in. Vital organs like the kidneys, brain, and heart cannot function without a proper amount of fluids and salt. Any loss of fluids from the body can cause dehydration.  CAUSES   Vomiting.  Diarrhea.  Excessive sweating.  Excessive urine output.  Fever. SYMPTOMS  Mild dehydration  Thirst.  Dry lips.  Slightly dry mouth. Moderate dehydration  Very dry mouth.  Sunken eyes.  Skin does not bounce back quickly when lightly pinched and released.  Dark urine and decreased urine production.  Decreased tear production.  Headache. Severe dehydration  Very dry mouth.  Extreme thirst.  Rapid, weak pulse (more than 100 beats per minute at rest).  Cold hands and feet.  Not able to sweat in spite of heat and temperature.  Rapid breathing.  Blue lips.  Confusion and lethargy.  Difficulty being awakened.  Minimal urine production.  No tears. DIAGNOSIS  Your caregiver will diagnose dehydration based on your symptoms and your exam. Blood and urine tests will help confirm the diagnosis. The diagnostic evaluation should also identify the cause of dehydration. TREATMENT  Treatment of mild or moderate dehydration can often be done at home by increasing the amount of fluids that you drink. It is best to drink small amounts of fluid more often. Drinking too much at one time can make vomiting worse. Refer to the home care instructions below. Severe dehydration needs to be treated at the hospital where you will probably be given intravenous (IV) fluids that contain water and electrolytes. HOME CARE INSTRUCTIONS   Ask your caregiver about specific rehydration instructions.  Drink enough fluids to keep your urine clear or pale yellow.  Drink small amounts frequently if you have nausea and vomiting.  Eat as you normally do.  Avoid:  Foods or drinks high in sugar.  Carbonated  drinks.  Juice.  Extremely hot or cold fluids.  Drinks with caffeine.  Fatty, greasy foods.  Alcohol.  Tobacco.  Overeating.  Gelatin desserts.  Wash your hands well to avoid spreading bacteria and viruses.  Only take over-the-counter or prescription medicines for pain, discomfort, or fever as directed by your caregiver.  Ask your caregiver if you should continue all prescribed and over-the-counter medicines.  Keep all follow-up appointments with your caregiver. SEEK MEDICAL CARE IF:  You have abdominal pain and it increases or stays in one area (localizes).  You have a rash, stiff neck, or severe headache.  You are irritable, sleepy, or difficult to awaken.  You are weak, dizzy, or extremely thirsty. SEEK IMMEDIATE MEDICAL CARE IF:   You are unable to keep fluids down or you get worse despite treatment.  You have frequent episodes of vomiting or diarrhea.  You have blood or green matter (bile) in your vomit.  You have blood in your stool or your stool looks black and tarry.  You have not urinated in 6 to 8 hours, or you have only urinated a small amount of very dark urine.  You have a fever.  You faint. MAKE SURE YOU:   Understand these instructions.  Will watch your condition.  Will get help right away if you are not doing well or get worse. Document Released: 06/27/2005 Document Revised: 09/19/2011 Document Reviewed: 02/14/2011 ExitCare Patient Information 2014 [employer].  

## 2013-10-25 NOTE — Progress Notes (Signed)
BP elevated after IV hydration 191/76.  Per Dr Alvy Bimler, give 20mg  lasix IV x 1, okay to discharge.  Pt does not need to come for IVF tomorrow 4/18.  Pt is aware.  SLJ

## 2013-10-26 ENCOUNTER — Ambulatory Visit: Payer: Medicare Other

## 2013-10-28 ENCOUNTER — Telehealth: Payer: Self-pay | Admitting: Hematology and Oncology

## 2013-10-28 ENCOUNTER — Other Ambulatory Visit: Payer: Medicare Other

## 2013-10-28 ENCOUNTER — Ambulatory Visit (HOSPITAL_BASED_OUTPATIENT_CLINIC_OR_DEPARTMENT_OTHER): Payer: Medicare Other

## 2013-10-28 ENCOUNTER — Ambulatory Visit (HOSPITAL_BASED_OUTPATIENT_CLINIC_OR_DEPARTMENT_OTHER): Payer: Medicare Other | Admitting: Hematology and Oncology

## 2013-10-28 ENCOUNTER — Ambulatory Visit: Payer: Medicare Other | Admitting: Hematology and Oncology

## 2013-10-28 ENCOUNTER — Ambulatory Visit: Payer: Medicare Other

## 2013-10-28 ENCOUNTER — Encounter: Payer: Self-pay | Admitting: Hematology and Oncology

## 2013-10-28 VITALS — BP 133/55 | HR 101 | Temp 98.1°F | Resp 18 | Ht 64.0 in | Wt 140.0 lb

## 2013-10-28 DIAGNOSIS — D72829 Elevated white blood cell count, unspecified: Secondary | ICD-10-CM

## 2013-10-28 DIAGNOSIS — Z8544 Personal history of malignant neoplasm of other female genital organs: Secondary | ICD-10-CM

## 2013-10-28 DIAGNOSIS — N179 Acute kidney failure, unspecified: Secondary | ICD-10-CM

## 2013-10-28 DIAGNOSIS — D649 Anemia, unspecified: Secondary | ICD-10-CM

## 2013-10-28 DIAGNOSIS — D696 Thrombocytopenia, unspecified: Secondary | ICD-10-CM

## 2013-10-28 DIAGNOSIS — Z95828 Presence of other vascular implants and grafts: Secondary | ICD-10-CM

## 2013-10-28 DIAGNOSIS — C9110 Chronic lymphocytic leukemia of B-cell type not having achieved remission: Secondary | ICD-10-CM

## 2013-10-28 DIAGNOSIS — Z853 Personal history of malignant neoplasm of breast: Secondary | ICD-10-CM

## 2013-10-28 LAB — MANUAL DIFFERENTIAL
ALC: 14.5 10*3/uL — AB (ref 0.9–3.3)
ANC (CHCC MAN DIFF): 2.8 10*3/uL (ref 1.5–6.5)
BAND NEUTROPHILS: 0 % (ref 0–10)
BLASTS: 0 % (ref 0–0)
Basophil: 0 % (ref 0–2)
EOS: 0 % (ref 0–7)
LYMPH: 79 % — ABNORMAL HIGH (ref 14–49)
MONO: 6 % (ref 0–14)
Metamyelocytes: 0 % (ref 0–0)
Myelocytes: 0 % (ref 0–0)
NRBC: 0 % (ref 0–0)
OTHER CELL: 0 % (ref 0–0)
PLT EST: DECREASED
PROMYELO: 0 % (ref 0–0)
SEG: 15 % — AB (ref 38–77)
VARIANT LYMPH: 0 % (ref 0–0)

## 2013-10-28 LAB — COMPREHENSIVE METABOLIC PANEL (CC13)
ALT: 16 U/L (ref 0–55)
ANION GAP: 8 meq/L (ref 3–11)
AST: 32 U/L (ref 5–34)
Albumin: 2.6 g/dL — ABNORMAL LOW (ref 3.5–5.0)
Alkaline Phosphatase: 143 U/L (ref 40–150)
BILIRUBIN TOTAL: 0.83 mg/dL (ref 0.20–1.20)
BUN: 16.4 mg/dL (ref 7.0–26.0)
CO2: 30 mEq/L — ABNORMAL HIGH (ref 22–29)
CREATININE: 0.7 mg/dL (ref 0.6–1.1)
Calcium: 8.3 mg/dL — ABNORMAL LOW (ref 8.4–10.4)
Chloride: 99 mEq/L (ref 98–109)
Glucose: 224 mg/dl — ABNORMAL HIGH (ref 70–140)
Potassium: 3.2 mEq/L — ABNORMAL LOW (ref 3.5–5.1)
Sodium: 136 mEq/L (ref 136–145)
Total Protein: 5.1 g/dL — ABNORMAL LOW (ref 6.4–8.3)

## 2013-10-28 LAB — LACTATE DEHYDROGENASE (CC13): LDH: 868 U/L — AB (ref 125–245)

## 2013-10-28 LAB — URIC ACID (CC13): Uric Acid, Serum: 3.5 mg/dl (ref 2.6–7.4)

## 2013-10-28 LAB — CBC WITH DIFFERENTIAL/PLATELET
HEMATOCRIT: 28.1 % — AB (ref 34.8–46.6)
HGB: 8.6 g/dL — ABNORMAL LOW (ref 11.6–15.9)
MCH: 29.1 pg (ref 25.1–34.0)
MCHC: 30.6 g/dL — ABNORMAL LOW (ref 31.5–36.0)
MCV: 94.9 fL (ref 79.5–101.0)
PLATELETS: 79 10*3/uL — AB (ref 145–400)
RBC: 2.96 10*6/uL — ABNORMAL LOW (ref 3.70–5.45)
RDW: 25.6 % — ABNORMAL HIGH (ref 11.2–14.5)
WBC: 18.4 10*3/uL — ABNORMAL HIGH (ref 3.9–10.3)

## 2013-10-28 MED ORDER — SODIUM CHLORIDE 0.9 % IJ SOLN
10.0000 mL | INTRAMUSCULAR | Status: DC | PRN
  Filled 2013-10-28: qty 10

## 2013-10-28 MED ORDER — HEPARIN SOD (PORK) LOCK FLUSH 100 UNIT/ML IV SOLN
500.0000 [IU] | Freq: Once | INTRAVENOUS | Status: AC
  Administered 2013-10-28: 500 [IU] via INTRAVENOUS
  Filled 2013-10-28: qty 5

## 2013-10-28 MED ORDER — SODIUM CHLORIDE 0.9 % IJ SOLN
10.0000 mL | INTRAMUSCULAR | Status: DC | PRN
  Administered 2013-10-28: 10 mL via INTRAVENOUS
  Filled 2013-10-28: qty 10

## 2013-10-28 MED ORDER — HEPARIN SOD (PORK) LOCK FLUSH 100 UNIT/ML IV SOLN
500.0000 [IU] | Freq: Once | INTRAVENOUS | Status: DC
  Filled 2013-10-28: qty 5

## 2013-10-28 NOTE — Progress Notes (Signed)
[facility] OFFICE PROGRESS NOTE  Patient Care Team: Kelton Pillar, MD as PCP - General Hart Rochester, MD as Referring Physician (Obstetrics and Gynecology) Edward Jolly, MD as Consulting Physician (General Surgery)  DIAGNOSIS: CLL with deletion 17p, malignant hypercalcemia with tumor lysis, for further management  SUMMARY OF ONCOLOGIC HISTORY: The patient was hospitalized from 10/07/2013 to 10/15/2013 because of recent fall due to weakness. She was also found to have acute renal failure and severe hypercalcemia as well as progressive leukocytosis. CT scan showed significant lymphadenopathy throughout with hypercalcemia. I reviewed her records extensively as documented by Dr. Azucena Freed note dated February 23rd 2015.  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  She also had history of stage I uterine cancer that was treated surgically followed by postoperative chemotherapy and brachytherapy.  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 After her treatment was completed, I did not see a repeat imaging study done. Her blood cell count went back to normal however over the past 6 months, has started to rise.   We started her on Ofatumumab week ago on 10/14/2013 with resolution of her hypercalcemia and improvement of the white blood cell count. Since she was discharged, she still feels weak but overall felt improved. On 10/23/2013, she was started on Ibrutinib.  INTERVAL HISTORY: Margaret Mann 80 y.o. female returns for further followup. She felt with great improvement of energy level. She denies any recent fever, chills, night sweats or abnormal weight loss She has no new lymphadenopathy.  I have reviewed the past medical history, past surgical history, social history and family history with the patient and they are  unchanged from previous note.  ALLERGIES:  is allergic to dilaudid.  MEDICATIONS:  Current Outpatient Prescriptions  Medication Sig Dispense Refill  . acyclovir (ZOVIRAX) 400 MG tablet Take 1 tablet (400 mg total) by mouth daily.  30 tablet  3  . allopurinol (ZYLOPRIM) 300 MG tablet Take 1 tablet (300 mg total) by mouth daily.  30 tablet  1  . aspirin EC 81 MG tablet Take 81 mg by mouth daily.      . cetirizine (ZYRTEC) 10 MG tablet Take 10 mg by mouth daily as needed.       . furosemide (LASIX) 20 MG tablet Take 1 tablet (20 mg total) by mouth daily.  30 tablet  0  . ibrutinib (IMBRUVICA) 140 MG capsul Take 3 capsules (420 mg total) by mouth daily.  90 capsule  0  . lidocaine-prilocaine (EMLA) cream Apply 1 application topically as needed.  30 g  0   Current Facility-Administered Medications  Medication Dose Route Frequency Provider Last Rate Last Dose  . heparin lock flush 100 unit/mL  500 Units Intravenous Once Heath Lark, MD      . sodium chloride 0.9 % injection 10 mL  10 mL Intravenous PRN Heath Lark, MD       Facility-Administered Medications Ordered in Other Visits  Medication Dose Route Frequency Provider Last Rate Last Dose  . sodium chloride 0.9 % injection 10 mL  10 mL Intravenous PRN Heath Lark, MD   10 mL at 10/23/13 1518  . sodium chloride 0.9 % injection 10 mL  10 mL Intravenous PRN Heath Lark, MD   10 mL at 10/28/13 0908    REVIEW OF SYSTEMS:   Constitutional: Denies fevers, chills or  abnormal weight loss Eyes: Denies blurriness of vision Ears, nose, mouth, throat, and face: Denies mucositis or sore throat Respiratory: Denies cough, dyspnea or wheezes Cardiovascular: Denies palpitation, chest discomfort or lower extremity swelling Gastrointestinal:  Denies nausea, heartburn or change in bowel habits Skin: Denies abnormal skin rashes Lymphatics: Denies new lymphadenopathy or easy bruising Neurological:Denies numbness, tingling or new weaknesses Behavioral/Psych:  Mood is stable, no new changes  All other systems were reviewed with the patient and are negative.  PHYSICAL EXAMINATION: ECOG PERFORMANCE STATUS: 1 - Symptomatic but completely ambulatory  Filed Vitals:   10/28/13 0926  BP: 133/55  Pulse: 101  Temp: 98.1 F (36.7 C)  Resp: 18   Filed Weights   10/28/13 0926  Weight: 140 lb (63.504 kg)    GENERAL:alert, no distress and comfortable. She looks thin and mildly cachectic SKIN: skin color, texture, turgor are normal, no rashes or significant lesions. Multiple bruises are noted EYES: normal, Conjunctiva are pink and non-injected, sclera clear OROPHARYNX:no exudate, no erythema and lips, buccal mucosa, and tongue normal  NECK: supple, thyroid normal size, non-tender, without nodularity LYMPH:  no palpable lymphadenopathy in the cervical, axillary or inguinal LUNGS: clear to auscultation and percussion with normal breathing effort HEART: regular rate & rhythm and no murmurs and no lower extremity edema ABDOMEN:abdomen soft, non-tender and normal bowel sounds Musculoskeletal:no cyanosis of digits and no clubbing  NEURO: alert & oriented x 3 with fluent speech, no focal motor/sensory deficits  LABORATORY DATA:  I have reviewed the data as listed    Component Value Date/Time   NA 136 10/28/2013 0903   NA 141 10/15/2013 0423   K 3.2* 10/28/2013 0903   K 3.7 10/15/2013 0423   CL 95* 10/15/2013 0423   CL 101 11/05/2012 0923   CO2 30* 10/28/2013 0903   CO2 37* 10/15/2013 0423   GLUCOSE 224* 10/28/2013 0903   GLUCOSE 144* 10/15/2013 0423   GLUCOSE 93 11/05/2012 0923   BUN 16.4 10/28/2013 0903   BUN 16 10/15/2013 0423   CREATININE 0.7 10/28/2013 0903   CREATININE 0.64 10/15/2013 0423   CALCIUM 8.3* 10/28/2013 0903   CALCIUM 8.3* 10/15/2013 0423   CALCIUM 10.1 10/08/2013 0350   PROT 5.1* 10/28/2013 0903   PROT 5.5* 10/15/2013 0423   ALBUMIN 2.6* 10/28/2013 0903   ALBUMIN 2.5* 10/15/2013 0423   AST 32 10/28/2013 0903   AST 64* 10/15/2013 0423   ALT 16 10/28/2013  0903   ALT 40* 10/15/2013 0423   ALKPHOS 143 10/28/2013 0903   ALKPHOS 154* 10/15/2013 0423   BILITOT 0.83 10/28/2013 0903   BILITOT 0.6 10/15/2013 0423   GFRNONAA 83* 10/15/2013 0423   GFRAA >90 10/15/2013 0423    No results found for this basename: SPEP,  UPEP,   kappa and lambda light chains    Lab Results  Component Value Date   WBC 18.4* 10/28/2013   NEUTROABS 7.5* 10/21/2013   HGB 8.6* 10/28/2013   HCT 28.1* 10/28/2013   MCV 94.9 10/28/2013   PLT 79* 10/28/2013      Chemistry      Component Value Date/Time   NA 136 10/28/2013 0903   NA 141 10/15/2013 0423   K 3.2* 10/28/2013 0903   K 3.7 10/15/2013 0423   CL 95* 10/15/2013 0423   CL 101 11/05/2012 0923   CO2 30* 10/28/2013 0903   CO2 37* 10/15/2013 0423   BUN 16.4 10/28/2013 0903   BUN 16 10/15/2013 0423   CREATININE 0.7 10/28/2013 2671  CREATININE 0.64 10/15/2013 0423      Component Value Date/Time   CALCIUM 8.3* 10/28/2013 0903   CALCIUM 8.3* 10/15/2013 0423   CALCIUM 10.1 10/08/2013 0350   ALKPHOS 143 10/28/2013 0903   ALKPHOS 154* 10/15/2013 0423   AST 32 10/28/2013 0903   AST 64* 10/15/2013 0423   ALT 16 10/28/2013 0903   ALT 40* 10/15/2013 0423   BILITOT 0.83 10/28/2013 0903   BILITOT 0.6 10/15/2013 0423     ASSESSMENT & PLAN:  #1 CLL with bulky lymphadenopathy  The pattern of persistent leukocytosis and the abnormal CT scan are compatible with disease progression. 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 Overall, the patient has very poor prognosis as her performance status is poor. Palliative therapy was started on 4/6.  She started on Ibrutinib on 10/23/2013. I will discontinue her Ofatumumab treatment. She had good response with reduction in leukocytosis. Due to poor performance status, I will continue to see her on a weekly basis. #2 Recurrent hypercalcemia, resolved PTH level is low and this is compatible with malignancy.  She responded well with high-dose steroids and fluids resuscitation. I have discontinue her furosemide. #3 liver  metastasis with abnormal liver function test  This is related to hepatic metastasis. It is unusual to see hepatic metastasis from CLL, though not impossible. In the scope of things,would not recommend liver biopsy right now.  Her liver function tests is improving. #4 history of DCIS & history of uterine cancer  Recent mammography and biopsy confirmed no evidence of disease recurrence  #5 tumor lysis prophylaxis  She responded well with recent IV fluids. She will continue allopurinol.  #6 Recent abnormal MRI brain  Continue observation. Due for another MRI in 6-8 weeks.Likely related to recent fall. Patient had an episode of nosebleed last week, resolved. Her platelet count is Stable.  #7 Antimicrobial prophylaxis  On acyclovir 400 mg daily  #8 Thrombocytopenia This is likely due to recent treatment. The patient denies recent history of bleeding such as epistaxis, hematuria or hematochezia. She is asymptomatic from the low platelet count. I will observe for now.  she does not require transfusion now. I will continue the chemotherapy at current dose without dosage adjustment.  If the thrombocytopenia gets progressive worse in the future, I might have to delay her treatment or adjust the chemotherapy dose. #9 Anemia This is likely due to recent treatment. The patient denies recent history of bleeding such as epistaxis, hematuria or hematochezia. She is asymptomatic from the anemia. I will observe for now.  She does not require transfusion now. I will continue the chemotherapy at current dose without dosage adjustment.  If the anemia gets progressive worse in the future, I might have to delay her treatment or adjust the chemotherapy dose.   All questions were answered. The patient knows to call the clinic with any problems, questions or concerns. I spent 40 minutes counseling the patient face to face. The total time spent in the appointment was 55 minutes and more than 50% was on counseling.      Heath Lark, MD 10/28/2013 10:33 AM

## 2013-10-28 NOTE — Telephone Encounter (Signed)
GAve pt appt for lab and MD for Serptember , clarifying IVF order from MD

## 2013-10-28 NOTE — Patient Instructions (Signed)

## 2013-10-29 IMAGING — CT CT CHEST W/ CM
2 of 6 series · 15 of 46 positions shown, 17 images · IV contrast (OMNIPAQUE)
Comparison: PET CT 03/06/2012.

CT CHEST

CLINICAL DATA: History of CLL, endometrial cancer and breast
cancer.

CT CHEST, ABDOMEN AND PELVIS WITH CONTRAST
TECHNIQUE: Multidetector CT imaging of the chest, abdomen and
pelvis was performed following the standard protocol during bolus
administration of intravenous contrast.
Contrast: 100mL OMNIPAQUE IOHEXOL 300 MG/ML  SOLN

[Series 2: cap with st · axial · 0.73mm/px · z∈[-630,-84]mm · 12 of 125 slices shown, 14 images]
[im 8/125  soft-tissue]
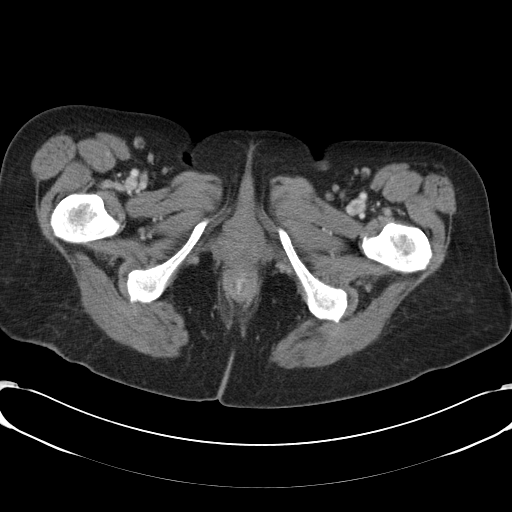
[im 8/125  bone]
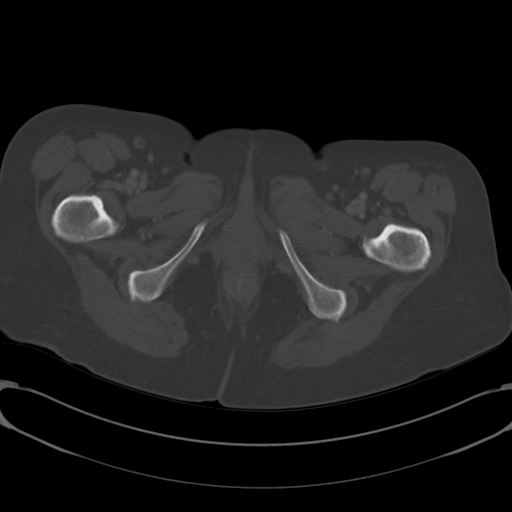
[im 22/125  soft-tissue]
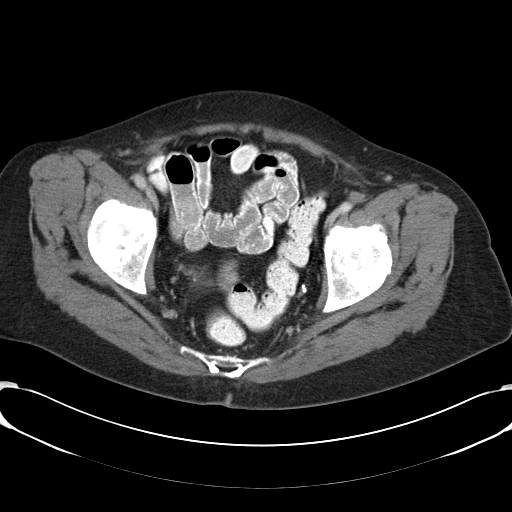
[im 30/125  soft-tissue]
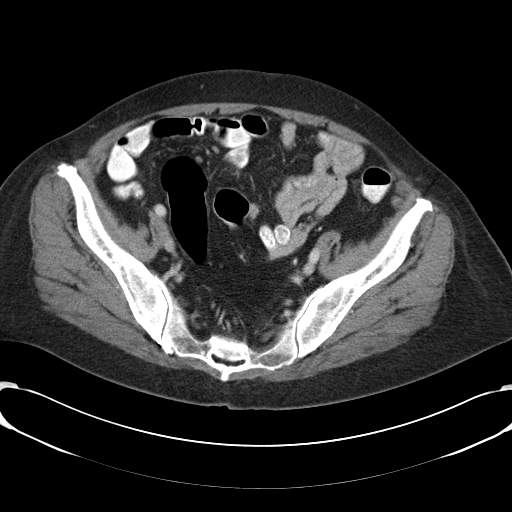
[im 37/125  soft-tissue]
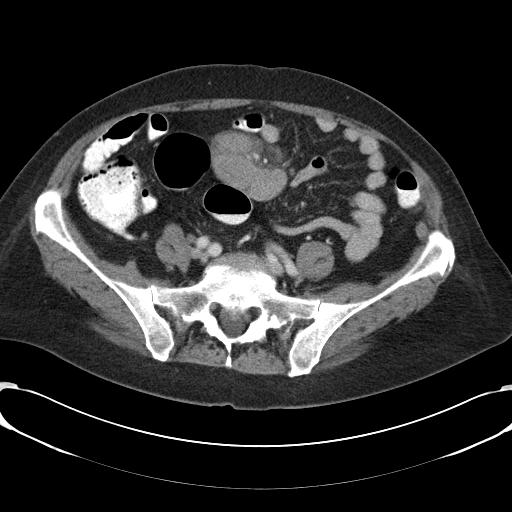
[im 52/125  soft-tissue]
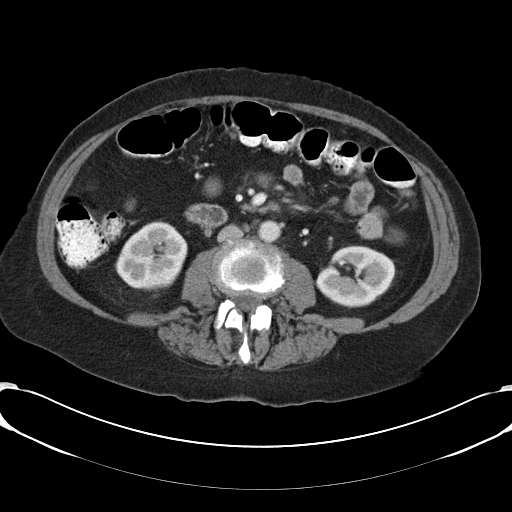
[im 59/125  soft-tissue]
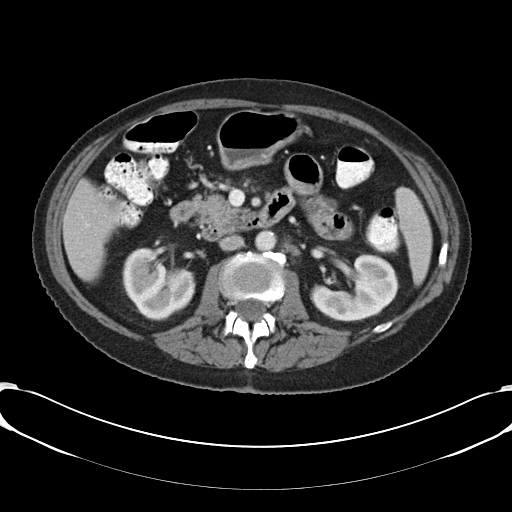
[im 66/125  soft-tissue]
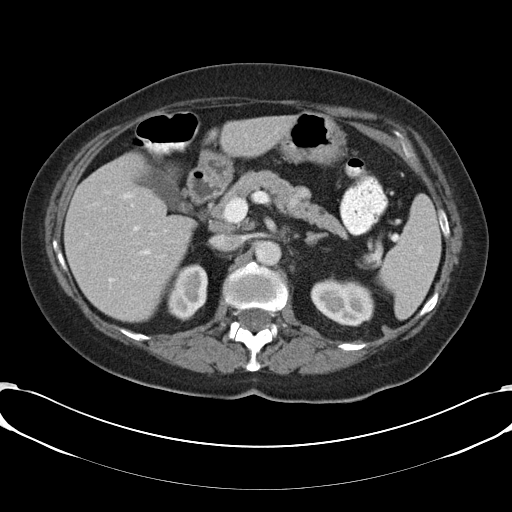
[im 81/125  soft-tissue]
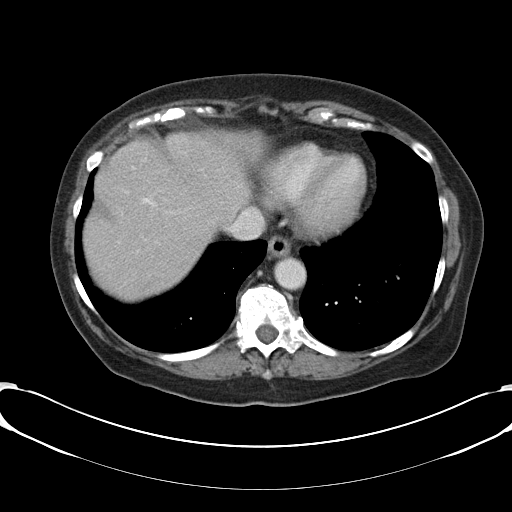
[im 88/125  soft-tissue]
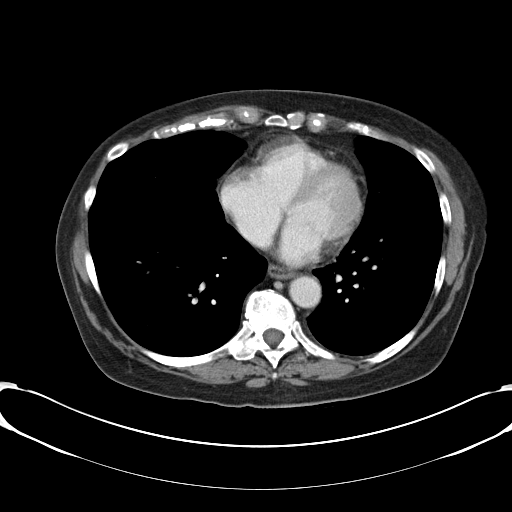
[im 88/125  bone]
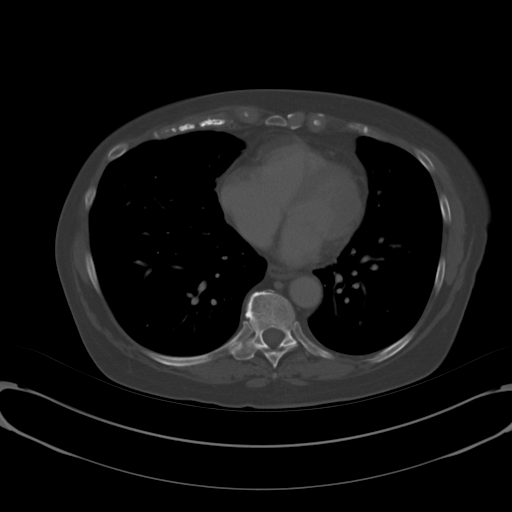
[im 95/125  soft-tissue]
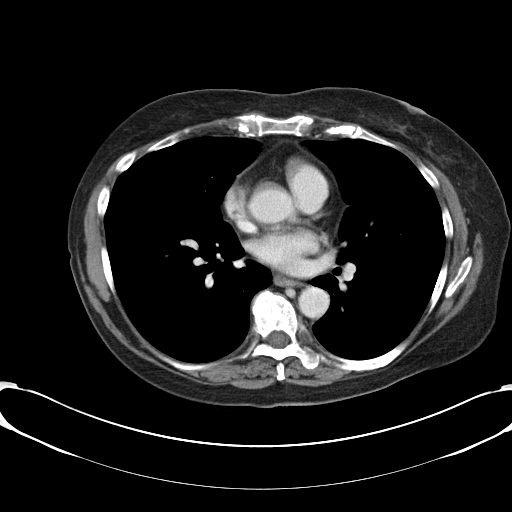
[im 110/125  soft-tissue]
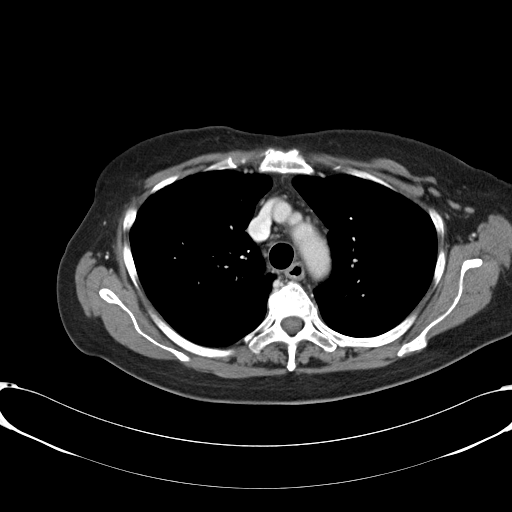
[im 117/125  soft-tissue]
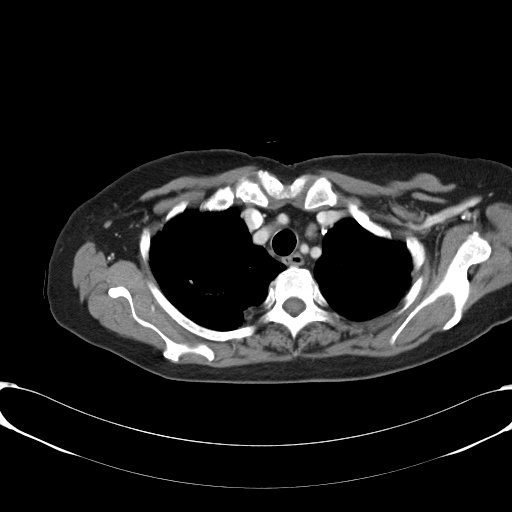

[Series 602: <mpr thick range> · coronal · 1.22mm/px · 3 of 80 slices shown]
[im 27/80  soft-tissue]
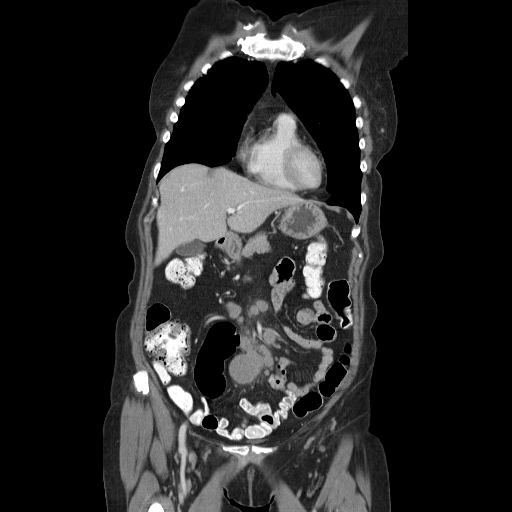
[im 36/80  soft-tissue]
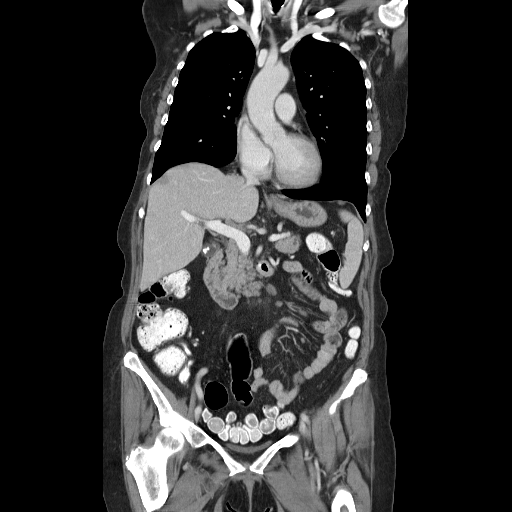
[im 44/80  soft-tissue]
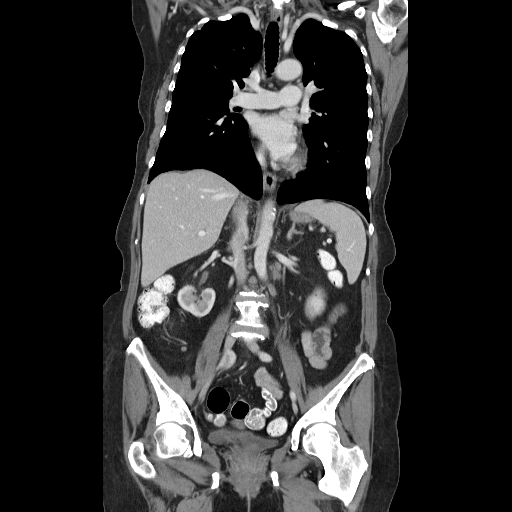

[15 of 46 positions shown; findings below may reference images not displayed]

FINDINGS: Mediastinum: Borderline enlarged left supraclavicular lymph nodes
appear slightly smaller than the prior examination, measuring up to
9 mm in short axis. No pathologically enlarged mediastinal,
internal mammary or hilar lymph nodes. Heart size is normal. There
is no significant pericardial fluid, thickening or pericardial
calcification.  Esophagus is unremarkable in appearance.

Lungs/Pleura: Linear scarring with some mild cylindrical and
varicose bronchiectasis in the apex of the right upper lobe is
unchanged.  No suspicious appearing pulmonary nodules or masses are
identified.  No acute consolidative airspace disease.  No pleural
effusions.

Musculoskeletal: Two small clips in the left breast may be related
to prior lumpectomy.  No definite focal soft tissue masses in the
breasts.  No definite pathologically enlarged axillary or
subpectoral lymph nodes are appreciated.  Several borderline
enlarged axillary lymph nodes are noted bilaterally.
IMPRESSION: 1.  Slight decrease in size in the left supraclavicular
lymphadenopathy, and resolution of previously noted left axillary
lymph node enlargement compared to the prior examination.
2.  Unchanged scarring in the apex of the right upper lobe.

CT ABDOMEN AND PELVIS
FINDINGS: Abdomen/Pelvis: The appearance of the liver, pancreas, spleen and
bilateral adrenal glands is unremarkable.  Small calcified
gallstones in the dependent portion of the gallbladder, without
findings to suggest acute cholecystitis at this time.  Multiple
tiny subcentimeter low attenuation lesions in the kidneys
bilaterally are too small to definitively characterize, but are
statistically likely to represent tiny cysts.

Numerous to borderline enlarged and mildly enlarged retroperitoneal
lymph nodes are noted, however, these generally appears smaller
than the prior examination.  The largest nodes in the abdomen and
pelvis are within the small bowel mesentery, and these both appear
smaller than the prior study, with the largest single nodal mass
measuring up to 3.6 cm in short axis which is considerably smaller
than the prior study (direct comparison in measurements is
difficult due to the irregular shape of this lesion and different
position of the mass when compared to the prior study).  No ascites
or pneumoperitoneum and no pathologic distension of small bowel.
Status post hysterectomy.  Ovaries are not confidently identified
and may be surgically absent, or may be atrophic.

Musculoskeletal: There are no aggressive appearing lytic or blastic
lesions noted in the visualized portions of the skeleton.
IMPRESSION: 1.  Today's study demonstrates a significant positive response to
therapy with decreased size of mesenteric and retroperitoneal
lymphadenopathy, as discussed above.
2.  Cholelithiasis without findings to suggest acute cholecystitis
at this time.
3.  Multiple subcentimeter low attenuation renal lesions
bilaterally too small to definitively characterize, but are
statistically likely to represent small cysts.

## 2013-11-04 ENCOUNTER — Ambulatory Visit (HOSPITAL_BASED_OUTPATIENT_CLINIC_OR_DEPARTMENT_OTHER): Payer: Medicare Other | Admitting: Hematology and Oncology

## 2013-11-04 ENCOUNTER — Encounter: Payer: Self-pay | Admitting: Hematology and Oncology

## 2013-11-04 ENCOUNTER — Ambulatory Visit (HOSPITAL_BASED_OUTPATIENT_CLINIC_OR_DEPARTMENT_OTHER): Payer: Medicare Other

## 2013-11-04 ENCOUNTER — Ambulatory Visit: Payer: Medicare Other

## 2013-11-04 ENCOUNTER — Other Ambulatory Visit: Payer: Medicare Other

## 2013-11-04 ENCOUNTER — Telehealth: Payer: Self-pay | Admitting: Hematology and Oncology

## 2013-11-04 VITALS — BP 126/62 | HR 97 | Temp 99.0°F | Resp 18 | Ht 64.0 in | Wt 140.6 lb

## 2013-11-04 DIAGNOSIS — D649 Anemia, unspecified: Secondary | ICD-10-CM

## 2013-11-04 DIAGNOSIS — C787 Secondary malignant neoplasm of liver and intrahepatic bile duct: Secondary | ICD-10-CM

## 2013-11-04 DIAGNOSIS — Z95828 Presence of other vascular implants and grafts: Secondary | ICD-10-CM

## 2013-11-04 DIAGNOSIS — R930 Abnormal findings on diagnostic imaging of skull and head, not elsewhere classified: Secondary | ICD-10-CM

## 2013-11-04 DIAGNOSIS — Z452 Encounter for adjustment and management of vascular access device: Secondary | ICD-10-CM

## 2013-11-04 DIAGNOSIS — Z8543 Personal history of malignant neoplasm of ovary: Secondary | ICD-10-CM

## 2013-11-04 DIAGNOSIS — C9110 Chronic lymphocytic leukemia of B-cell type not having achieved remission: Secondary | ICD-10-CM

## 2013-11-04 DIAGNOSIS — Z853 Personal history of malignant neoplasm of breast: Secondary | ICD-10-CM

## 2013-11-04 LAB — CBC WITH DIFFERENTIAL/PLATELET
BASO%: 1.3 % (ref 0.0–2.0)
Basophils Absolute: 0.1 10*3/uL (ref 0.0–0.1)
EOS%: 0.4 % (ref 0.0–7.0)
Eosinophils Absolute: 0 10*3/uL (ref 0.0–0.5)
HCT: 24.9 % — ABNORMAL LOW (ref 34.8–46.6)
HGB: 8.1 g/dL — ABNORMAL LOW (ref 11.6–15.9)
LYMPH%: 61.7 % — AB (ref 14.0–49.7)
MCH: 30.3 pg (ref 25.1–34.0)
MCHC: 32.5 g/dL (ref 31.5–36.0)
MCV: 93.3 fL (ref 79.5–101.0)
MONO#: 0.7 10*3/uL (ref 0.1–0.9)
MONO%: 8.7 % (ref 0.0–14.0)
NEUT#: 2.2 10*3/uL (ref 1.5–6.5)
NEUT%: 27.9 % — ABNORMAL LOW (ref 38.4–76.8)
PLATELETS: 165 10*3/uL (ref 145–400)
RBC: 2.67 10*6/uL — AB (ref 3.70–5.45)
RDW: 23 % — ABNORMAL HIGH (ref 11.2–14.5)
WBC: 7.9 10*3/uL (ref 3.9–10.3)
lymph#: 4.9 10*3/uL — ABNORMAL HIGH (ref 0.9–3.3)

## 2013-11-04 LAB — COMPREHENSIVE METABOLIC PANEL (CC13)
ALBUMIN: 2.6 g/dL — AB (ref 3.5–5.0)
ALT: 20 U/L (ref 0–55)
AST: 26 U/L (ref 5–34)
Alkaline Phosphatase: 147 U/L (ref 40–150)
Anion Gap: 8 mEq/L (ref 3–11)
BUN: 8.4 mg/dL (ref 7.0–26.0)
CHLORIDE: 98 meq/L (ref 98–109)
CO2: 26 meq/L (ref 22–29)
Calcium: 8.8 mg/dL (ref 8.4–10.4)
Creatinine: 0.6 mg/dL (ref 0.6–1.1)
GLUCOSE: 98 mg/dL (ref 70–140)
POTASSIUM: 3.9 meq/L (ref 3.5–5.1)
SODIUM: 132 meq/L — AB (ref 136–145)
TOTAL PROTEIN: 5.9 g/dL — AB (ref 6.4–8.3)
Total Bilirubin: 0.73 mg/dL (ref 0.20–1.20)

## 2013-11-04 LAB — URIC ACID (CC13): Uric Acid, Serum: 2.6 mg/dl (ref 2.6–7.4)

## 2013-11-04 LAB — LACTATE DEHYDROGENASE (CC13): LDH: 274 U/L — AB (ref 125–245)

## 2013-11-04 MED ORDER — HEPARIN SOD (PORK) LOCK FLUSH 100 UNIT/ML IV SOLN
500.0000 [IU] | Freq: Once | INTRAVENOUS | Status: AC
  Administered 2013-11-04: 500 [IU] via INTRAVENOUS
  Filled 2013-11-04: qty 5

## 2013-11-04 MED ORDER — SODIUM CHLORIDE 0.9 % IJ SOLN
10.0000 mL | INTRAMUSCULAR | Status: DC | PRN
  Administered 2013-11-04: 10 mL via INTRAVENOUS
  Filled 2013-11-04: qty 10

## 2013-11-04 NOTE — Progress Notes (Signed)
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 She was also found to have acute renal failure and severe hypercalcemia as well as progressive leukocytosis. CT scan showed significant lymphadenopathy throughout with hypercalcemia. I reviewed her records extensively as documented by Dr. Azucena Freed note dated February 23rd 2015.  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  She also had history of stage I uterine cancer that was treated surgically followed by postoperative chemotherapy and brachytherapy.  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 After her treatment was completed, I did not see a repeat imaging study done. Her blood cell count went back to normal however over the past 6 months, has started to rise.   We started her on Ofatumumab week ago on 10/14/2013 with resolution of her hypercalcemia and improvement of the white blood cell count. Since she was discharged, she still feels weak but overall felt improved. On 10/23/2013, she was started on Ibrutinib. On 11/04/2013, her white blood cell count has normalized.  INTERVAL HISTORY: Margaret Mann 80 y.o. female returns for further followup. She has excellent energy level. She have mild nosebleeds coming from the left nostril. She denies any new lymphadenopathy. Denies any diarrhea.  I have reviewed the past medical history, past surgical history, social history and family history with the patient and they are  unchanged from previous note.  ALLERGIES:  is allergic to dilaudid.  MEDICATIONS:  Current Outpatient Prescriptions  Medication Sig Dispense Refill  . acyclovir (ZOVIRAX) 400 MG tablet Take 1 tablet (400 mg total) by mouth daily.  30 tablet  3  . allopurinol (ZYLOPRIM) 300 MG tablet Take 1 tablet (300 mg total) by mouth daily.  30 tablet  1  . aspirin EC 81 MG tablet Take 81 mg by mouth daily.      . cetirizine (ZYRTEC) 10 MG tablet Take 10 mg by mouth daily as needed.       . ibrutinib (IMBRUVICA) 140 MG capsul Take 3 capsules (420 mg total) by mouth daily.  90 capsule  0   No current facility-administered medications for this visit.   Facility-Administered Medications Ordered in Other Visits  Medication Dose Route Frequency Provider Last Rate Last Dose  . sodium chloride 0.9 % injection 10 mL  10 mL Intravenous PRN Heath Lark, MD   10 mL at 10/23/13 1518  . sodium chloride 0.9 % injection 10 mL  10 mL Intravenous PRN Heath Lark, MD   10 mL at 11/04/13 1229    REVIEW OF SYSTEMS:   Constitutional: Denies fevers, chills or abnormal weight loss Eyes: Denies blurriness of vision Ears, nose, mouth, throat, and face: Denies mucositis or sore throat Respiratory: Denies cough, dyspnea or wheezes Cardiovascular: Denies palpitation, chest discomfort or lower extremity swelling Gastrointestinal:  Denies nausea, heartburn or change in bowel habits Skin: Denies abnormal skin rashes Lymphatics: Denies new lymphadenopathy or easy bruising Neurological:Denies numbness, tingling or new weaknesses Behavioral/Psych: Mood is stable, no new changes  All other systems were reviewed with the patient and are negative.  PHYSICAL EXAMINATION: ECOG PERFORMANCE STATUS: 0 - Asymptomatic  Filed Vitals:   11/04/13 1248  BP: 126/62  Pulse: 97  Temp: 99 F (37.2 C)  Resp: 18   Filed Weights   11/04/13 1248  Weight: 140 lb 9.6 oz (63.776 kg)    GENERAL:alert, no distress and comfortable SKIN: skin  color, texture, turgor are normal, no rashes or significant lesions. She has some bruising from before.  EYES: normal, Conjunctiva are pink and non-injected, sclera clear OROPHARYNX:no exudate, no erythema and lips, buccal mucosa, and tongue normal  NECK: supple, thyroid normal size, non-tender, without nodularity LYMPH:  no palpable lymphadenopathy in the cervical, axillary or inguinal. Previously palpable fullness in her neck has resolved. LUNGS: clear to auscultation and percussion with normal breathing effort HEART: regular rate & rhythm and no murmurs and no lower extremity edema ABDOMEN:abdomen soft, non-tender and normal bowel sounds Musculoskeletal:no cyanosis of digits and no clubbing  NEURO: alert & oriented x 3 with fluent speech, no focal motor/sensory deficits  LABORATORY DATA:  I have reviewed the data as listed    Component Value Date/Time   NA 132* 11/04/2013 1216   NA 141 10/15/2013 0423   K 3.9 11/04/2013 1216   K 3.7 10/15/2013 0423   CL 95* 10/15/2013 0423   CL 101 11/05/2012 0923   CO2 26 11/04/2013 1216   CO2 37* 10/15/2013 0423   GLUCOSE 98 11/04/2013 1216   GLUCOSE 144* 10/15/2013 0423   GLUCOSE 93 11/05/2012 0923   BUN 8.4 11/04/2013 1216   BUN 16 10/15/2013 0423   CREATININE 0.6 11/04/2013 1216   CREATININE 0.64 10/15/2013 0423   CALCIUM 8.8 11/04/2013 1216   CALCIUM 8.3* 10/15/2013 0423   CALCIUM 10.1 10/08/2013 0350   PROT 5.9* 11/04/2013 1216   PROT 5.5* 10/15/2013 0423   ALBUMIN 2.6* 11/04/2013 1216   ALBUMIN 2.5* 10/15/2013 0423   AST 26 11/04/2013 1216   AST 64* 10/15/2013 0423   ALT 20 11/04/2013 1216   ALT 40* 10/15/2013 0423   ALKPHOS 147 11/04/2013 1216   ALKPHOS 154* 10/15/2013 0423   BILITOT 0.73 11/04/2013 1216   BILITOT 0.6 10/15/2013 0423   GFRNONAA 83* 10/15/2013 0423   GFRAA >90 10/15/2013 0423    No results found for this basename: SPEP,  UPEP,   kappa and lambda light chains    Lab Results  Component Value Date   WBC 7.9 11/04/2013   NEUTROABS 2.2 11/04/2013   HGB  8.1* 11/04/2013   HCT 24.9* 11/04/2013   MCV 93.3 11/04/2013   PLT 165 11/04/2013      Chemistry      Component Value Date/Time   NA 132* 11/04/2013 1216   NA 141 10/15/2013 0423   K 3.9 11/04/2013 1216   K 3.7 10/15/2013 0423   CL 95* 10/15/2013 0423   CL 101 11/05/2012 0923   CO2 26 11/04/2013 1216   CO2 37* 10/15/2013 0423   BUN 8.4 11/04/2013 1216   BUN 16 10/15/2013 0423   CREATININE 0.6 11/04/2013 1216   CREATININE 0.64 10/15/2013 0423      Component Value Date/Time   CALCIUM 8.8 11/04/2013 1216   CALCIUM 8.3* 10/15/2013 0423   CALCIUM 10.1 10/08/2013 0350   ALKPHOS 147 11/04/2013 1216   ALKPHOS 154* 10/15/2013 0423   AST 26 11/04/2013 1216   AST 64* 10/15/2013 0423   ALT 20 11/04/2013 1216   ALT 40* 10/15/2013 0423   BILITOT 0.73 11/04/2013 1216   BILITOT 0.6 10/15/2013  0423     ASSESSMENT & PLAN:  #1 CLL with bulky lymphadenopathy  The pattern of persistent leukocytosis and the abnormal CT scan are compatible with disease progression. 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 Overall, the patient has very poor prognosis as her performance status is poor. Palliative therapy was started on 4/6.  She started on Ibrutinib on 10/23/2013. I will discontinue her Ofatumumab treatment. She had good response with normalization of her white blood cell count.. Her performance status has improved anemia normal. She will continue to have weekly blood work but I see her in 3 weeks. #2 Recurrent hypercalcemia, resolved PTH level is low and this is compatible with malignancy.  She responded well with high-dose steroids and fluids resuscitation. I have discontinue her furosemide. #3 liver metastasis with abnormal liver function test  This is related to hepatic metastasis. It is unusual to see hepatic metastasis from CLL, though not impossible. In the scope of things,would not recommend liver biopsy right now.  Her liver function tests is improving and is now back to normal. I will repeat CT scan in a few months to  check on this. #4 history of DCIS & history of uterine cancer  Recent mammography and biopsy confirmed no evidence of disease recurrence  #5 tumor lysis prophylaxis  She responded well with recent IV fluids. She will continue allopurinol.  #6 Recent abnormal MRI brain  Continue observation. Due for another MRI in 6-8 weeks.Likely related to recent fall. Patient had an episode of nosebleed last week, resolved. Her platelet count is Stable.  #7 Antimicrobial prophylaxis  On acyclovir 400 mg daily  #8 Thrombocytopenia, resolved #9 Anemia This is likely due to recent treatment. The patient denies recent history of bleeding such as epistaxis, hematuria or hematochezia. She is asymptomatic from the anemia. I will observe for now.  She does not require transfusion now. I will continue the chemotherapy at current dose without dosage adjustment.  If the anemia gets progressive worse in the future, I might have to delay her treatment or adjust the chemotherapy dose. I discussed with the patient the possibility of needing blood transfusion and she consented to be transfused if her hemoglobin dropped to less than 7.5 g.   Orders Placed This Encounter  Procedures  . Hold Tube, Blood Bank    Standing Status: Future     Number of Occurrences:      Standing Expiration Date: 11/04/2014   All questions were answered. The patient knows to call the clinic with any problems, questions or concerns. No barriers to learning was detected. I spent 25 minutes counseling the patient face to face. The total time spent in the appointment was 30 minutes and more than 50% was on counseling and review of test results     Heath Lark, MD 11/04/2013 2:05 PM

## 2013-11-04 NOTE — Patient Instructions (Signed)

## 2013-11-04 NOTE — Telephone Encounter (Signed)
gv and printed appt sched and avs for opt for May

## 2013-11-11 ENCOUNTER — Ambulatory Visit: Payer: Medicare Other

## 2013-11-11 ENCOUNTER — Other Ambulatory Visit (HOSPITAL_BASED_OUTPATIENT_CLINIC_OR_DEPARTMENT_OTHER): Payer: Medicare Other

## 2013-11-11 ENCOUNTER — Telehealth: Payer: Self-pay | Admitting: *Deleted

## 2013-11-11 ENCOUNTER — Other Ambulatory Visit: Payer: Self-pay | Admitting: Hematology and Oncology

## 2013-11-11 VITALS — BP 135/45 | HR 68 | Temp 97.2°F | Resp 16

## 2013-11-11 DIAGNOSIS — Z95828 Presence of other vascular implants and grafts: Secondary | ICD-10-CM

## 2013-11-11 DIAGNOSIS — E876 Hypokalemia: Secondary | ICD-10-CM

## 2013-11-11 DIAGNOSIS — C9110 Chronic lymphocytic leukemia of B-cell type not having achieved remission: Secondary | ICD-10-CM

## 2013-11-11 LAB — COMPREHENSIVE METABOLIC PANEL (CC13)
ALBUMIN: 2.4 g/dL — AB (ref 3.5–5.0)
ALT: 10 U/L (ref 0–55)
AST: 17 U/L (ref 5–34)
Alkaline Phosphatase: 111 U/L (ref 40–150)
Anion Gap: 9 mEq/L (ref 3–11)
BUN: 8.8 mg/dL (ref 7.0–26.0)
CALCIUM: 8.8 mg/dL (ref 8.4–10.4)
CHLORIDE: 100 meq/L (ref 98–109)
CO2: 25 mEq/L (ref 22–29)
Creatinine: 0.5 mg/dL — ABNORMAL LOW (ref 0.6–1.1)
Glucose: 107 mg/dl (ref 70–140)
POTASSIUM: 3 meq/L — AB (ref 3.5–5.1)
Sodium: 134 mEq/L — ABNORMAL LOW (ref 136–145)
Total Bilirubin: 0.43 mg/dL (ref 0.20–1.20)
Total Protein: 5.6 g/dL — ABNORMAL LOW (ref 6.4–8.3)

## 2013-11-11 LAB — CBC WITH DIFFERENTIAL/PLATELET
BASO%: 0.4 % (ref 0.0–2.0)
BASOS ABS: 0 10*3/uL (ref 0.0–0.1)
EOS ABS: 0.1 10*3/uL (ref 0.0–0.5)
EOS%: 0.9 % (ref 0.0–7.0)
HCT: 24.4 % — ABNORMAL LOW (ref 34.8–46.6)
HEMOGLOBIN: 8.1 g/dL — AB (ref 11.6–15.9)
LYMPH%: 49.8 % — AB (ref 14.0–49.7)
MCH: 30.6 pg (ref 25.1–34.0)
MCHC: 33.3 g/dL (ref 31.5–36.0)
MCV: 91.9 fL (ref 79.5–101.0)
MONO#: 0.6 10*3/uL (ref 0.1–0.9)
MONO%: 8.1 % (ref 0.0–14.0)
NEUT%: 40.8 % (ref 38.4–76.8)
NEUTROS ABS: 3.2 10*3/uL (ref 1.5–6.5)
Platelets: 279 10*3/uL (ref 145–400)
RBC: 2.65 10*6/uL — ABNORMAL LOW (ref 3.70–5.45)
RDW: 22.4 % — AB (ref 11.2–14.5)
WBC: 7.9 10*3/uL (ref 3.9–10.3)
lymph#: 3.9 10*3/uL — ABNORMAL HIGH (ref 0.9–3.3)

## 2013-11-11 LAB — LACTATE DEHYDROGENASE (CC13): LDH: 192 U/L (ref 125–245)

## 2013-11-11 LAB — URIC ACID (CC13): Uric Acid, Serum: 2.9 mg/dl (ref 2.6–7.4)

## 2013-11-11 LAB — HOLD TUBE, BLOOD BANK

## 2013-11-11 MED ORDER — SODIUM CHLORIDE 0.9 % IJ SOLN
10.0000 mL | INTRAMUSCULAR | Status: DC | PRN
  Administered 2013-11-11: 10 mL via INTRAVENOUS
  Filled 2013-11-11: qty 10

## 2013-11-11 MED ORDER — POTASSIUM CHLORIDE CRYS ER 20 MEQ PO TBCR: 20.0000 meq | EXTENDED_RELEASE_TABLET | Freq: Every day | ORAL | Status: DC

## 2013-11-11 MED ORDER — HEPARIN SOD (PORK) LOCK FLUSH 100 UNIT/ML IV SOLN
500.0000 [IU] | Freq: Once | INTRAVENOUS | Status: AC
  Administered 2013-11-11: 500 [IU] via INTRAVENOUS
  Filled 2013-11-11: qty 5

## 2013-11-11 NOTE — Telephone Encounter (Signed)
Informed pt of low Potassium level at 3.0 and new Rx for potassium 20 Meq daily until next lab appt on 5/11.  Pt reports she has at least a few months of Potassium 20 meq tablets left at home that are not expired.  Informed pt she can use those and make sure she is taking 20 meq once a day.  Pt verbalized understanding.   Pt also asks if ok to go to beach w/ her family this week.  Informed pt this is ok. She also asks if she needs to keep appt w/ her PCP next month?  Informed pt it is important to keep routine visits w/ her PCP as scheduled.  She also asks if she should keep her dentist appt for cleaning and routine check up later this month.  Instructed pt it is wise to keep her dentist appt and ok to have cleaning and check up.  Let Dr. Alvy Bimler know before any dental procedures. She verbalized understanding.

## 2013-11-11 NOTE — Patient Instructions (Signed)

## 2013-11-18 ENCOUNTER — Ambulatory Visit: Payer: Medicare Other

## 2013-11-18 ENCOUNTER — Other Ambulatory Visit (HOSPITAL_BASED_OUTPATIENT_CLINIC_OR_DEPARTMENT_OTHER): Payer: Medicare Other

## 2013-11-18 ENCOUNTER — Telehealth: Payer: Self-pay | Admitting: *Deleted

## 2013-11-18 ENCOUNTER — Ambulatory Visit (HOSPITAL_BASED_OUTPATIENT_CLINIC_OR_DEPARTMENT_OTHER): Payer: Medicare Other

## 2013-11-18 VITALS — BP 143/50 | HR 84 | Temp 97.0°F | Resp 16

## 2013-11-18 DIAGNOSIS — C9110 Chronic lymphocytic leukemia of B-cell type not having achieved remission: Secondary | ICD-10-CM

## 2013-11-18 DIAGNOSIS — Z95828 Presence of other vascular implants and grafts: Secondary | ICD-10-CM

## 2013-11-18 DIAGNOSIS — Z452 Encounter for adjustment and management of vascular access device: Secondary | ICD-10-CM

## 2013-11-18 LAB — CBC WITH DIFFERENTIAL/PLATELET
BASO%: 0.4 % (ref 0.0–2.0)
Basophils Absolute: 0 10*3/uL (ref 0.0–0.1)
EOS%: 0.7 % (ref 0.0–7.0)
Eosinophils Absolute: 0.1 10*3/uL (ref 0.0–0.5)
HCT: 26.1 % — ABNORMAL LOW (ref 34.8–46.6)
HGB: 8.4 g/dL — ABNORMAL LOW (ref 11.6–15.9)
LYMPH%: 49.3 % (ref 14.0–49.7)
MCH: 29.1 pg (ref 25.1–34.0)
MCHC: 31.9 g/dL (ref 31.5–36.0)
MCV: 91 fL (ref 79.5–101.0)
MONO#: 0.6 10*3/uL (ref 0.1–0.9)
MONO%: 7.4 % (ref 0.0–14.0)
NEUT#: 3.6 10*3/uL (ref 1.5–6.5)
NEUT%: 42.2 % (ref 38.4–76.8)
Platelets: 354 10*3/uL (ref 145–400)
RBC: 2.87 10*6/uL — AB (ref 3.70–5.45)
RDW: 22.6 % — ABNORMAL HIGH (ref 11.2–14.5)
WBC: 8.5 10*3/uL (ref 3.9–10.3)
lymph#: 4.2 10*3/uL — ABNORMAL HIGH (ref 0.9–3.3)

## 2013-11-18 LAB — COMPREHENSIVE METABOLIC PANEL (CC13)
ALBUMIN: 2.6 g/dL — AB (ref 3.5–5.0)
ALT: 9 U/L (ref 0–55)
ANION GAP: 8 meq/L (ref 3–11)
AST: 18 U/L (ref 5–34)
Alkaline Phosphatase: 103 U/L (ref 40–150)
BUN: 11.5 mg/dL (ref 7.0–26.0)
CALCIUM: 9 mg/dL (ref 8.4–10.4)
CO2: 26 meq/L (ref 22–29)
CREATININE: 0.6 mg/dL (ref 0.6–1.1)
Chloride: 103 mEq/L (ref 98–109)
Glucose: 140 mg/dl (ref 70–140)
Potassium: 3.9 mEq/L (ref 3.5–5.1)
Sodium: 138 mEq/L (ref 136–145)
Total Bilirubin: 0.34 mg/dL (ref 0.20–1.20)
Total Protein: 5.7 g/dL — ABNORMAL LOW (ref 6.4–8.3)

## 2013-11-18 LAB — LACTATE DEHYDROGENASE (CC13): LDH: 216 U/L (ref 125–245)

## 2013-11-18 LAB — URIC ACID (CC13): URIC ACID, SERUM: 2.7 mg/dL (ref 2.6–7.4)

## 2013-11-18 MED ORDER — SODIUM CHLORIDE 0.9 % IJ SOLN
10.0000 mL | INTRAMUSCULAR | Status: DC | PRN
  Administered 2013-11-18: 10 mL via INTRAVENOUS
  Filled 2013-11-18: qty 10

## 2013-11-18 MED ORDER — HEPARIN SOD (PORK) LOCK FLUSH 100 UNIT/ML IV SOLN
500.0000 [IU] | Freq: Once | INTRAVENOUS | Status: AC
  Administered 2013-11-18: 500 [IU] via INTRAVENOUS
  Filled 2013-11-18: qty 5

## 2013-11-18 NOTE — Telephone Encounter (Signed)
Pt asking if ok for her to drive herself to [place] on Sunday?

## 2013-11-18 NOTE — Patient Instructions (Signed)

## 2013-11-18 NOTE — Telephone Encounter (Signed)
Informed pt ok per Dr. Alvy Bimler for her to drive herself to church on Sunday. She verbalized understanding.

## 2013-11-18 NOTE — Telephone Encounter (Signed)
Yes

## 2013-11-25 ENCOUNTER — Telehealth: Payer: Self-pay | Admitting: Hematology and Oncology

## 2013-11-25 ENCOUNTER — Ambulatory Visit (HOSPITAL_BASED_OUTPATIENT_CLINIC_OR_DEPARTMENT_OTHER): Payer: Medicare Other | Admitting: Hematology and Oncology

## 2013-11-25 ENCOUNTER — Other Ambulatory Visit: Payer: Medicare Other

## 2013-11-25 ENCOUNTER — Ambulatory Visit: Payer: Medicare Other

## 2013-11-25 ENCOUNTER — Other Ambulatory Visit (HOSPITAL_BASED_OUTPATIENT_CLINIC_OR_DEPARTMENT_OTHER): Payer: Medicare Other

## 2013-11-25 ENCOUNTER — Ambulatory Visit (HOSPITAL_BASED_OUTPATIENT_CLINIC_OR_DEPARTMENT_OTHER): Payer: Medicare Other

## 2013-11-25 VITALS — BP 137/74 | HR 84 | Temp 98.0°F | Resp 20 | Ht 64.0 in | Wt 152.1 lb

## 2013-11-25 DIAGNOSIS — D649 Anemia, unspecified: Secondary | ICD-10-CM

## 2013-11-25 DIAGNOSIS — C9110 Chronic lymphocytic leukemia of B-cell type not having achieved remission: Secondary | ICD-10-CM

## 2013-11-25 DIAGNOSIS — Z8542 Personal history of malignant neoplasm of other parts of uterus: Secondary | ICD-10-CM

## 2013-11-25 DIAGNOSIS — Z95828 Presence of other vascular implants and grafts: Secondary | ICD-10-CM

## 2013-11-25 DIAGNOSIS — R9409 Abnormal results of other function studies of central nervous system: Secondary | ICD-10-CM

## 2013-11-25 DIAGNOSIS — M7989 Other specified soft tissue disorders: Secondary | ICD-10-CM

## 2013-11-25 DIAGNOSIS — Z853 Personal history of malignant neoplasm of breast: Secondary | ICD-10-CM

## 2013-11-25 DIAGNOSIS — R600 Localized edema: Secondary | ICD-10-CM

## 2013-11-25 LAB — CBC WITH DIFFERENTIAL/PLATELET
BASO%: 1.2 % (ref 0.0–2.0)
Basophils Absolute: 0.1 10*3/uL (ref 0.0–0.1)
EOS%: 1.8 % (ref 0.0–7.0)
Eosinophils Absolute: 0.1 10*3/uL (ref 0.0–0.5)
HEMATOCRIT: 29 % — AB (ref 34.8–46.6)
HGB: 9.1 g/dL — ABNORMAL LOW (ref 11.6–15.9)
LYMPH%: 53.9 % — ABNORMAL HIGH (ref 14.0–49.7)
MCH: 28.2 pg (ref 25.1–34.0)
MCHC: 31.5 g/dL (ref 31.5–36.0)
MCV: 89.4 fL (ref 79.5–101.0)
MONO#: 0.6 10*3/uL (ref 0.1–0.9)
MONO%: 7.9 % (ref 0.0–14.0)
NEUT%: 35.2 % — AB (ref 38.4–76.8)
NEUTROS ABS: 2.8 10*3/uL (ref 1.5–6.5)
Platelets: 294 10*3/uL (ref 145–400)
RBC: 3.25 10*6/uL — ABNORMAL LOW (ref 3.70–5.45)
RDW: 21.5 % — AB (ref 11.2–14.5)
WBC: 8 10*3/uL (ref 3.9–10.3)
lymph#: 4.3 10*3/uL — ABNORMAL HIGH (ref 0.9–3.3)

## 2013-11-25 LAB — COMPREHENSIVE METABOLIC PANEL (CC13)
ALK PHOS: 89 U/L (ref 40–150)
ALT: 7 U/L (ref 0–55)
AST: 17 U/L (ref 5–34)
Albumin: 2.9 g/dL — ABNORMAL LOW (ref 3.5–5.0)
Anion Gap: 12 mEq/L — ABNORMAL HIGH (ref 3–11)
BILIRUBIN TOTAL: 0.39 mg/dL (ref 0.20–1.20)
BUN: 10.8 mg/dL (ref 7.0–26.0)
CO2: 26 mEq/L (ref 22–29)
CREATININE: 0.7 mg/dL (ref 0.6–1.1)
Calcium: 8.8 mg/dL (ref 8.4–10.4)
Chloride: 104 mEq/L (ref 98–109)
Glucose: 90 mg/dl (ref 70–140)
Potassium: 3.5 mEq/L (ref 3.5–5.1)
Sodium: 141 mEq/L (ref 136–145)
Total Protein: 5.8 g/dL — ABNORMAL LOW (ref 6.4–8.3)

## 2013-11-25 LAB — URIC ACID (CC13): Uric Acid, Serum: 3.1 mg/dl (ref 2.6–7.4)

## 2013-11-25 LAB — LACTATE DEHYDROGENASE (CC13): LDH: 208 U/L (ref 125–245)

## 2013-11-25 MED ORDER — SODIUM CHLORIDE 0.9 % IJ SOLN
10.0000 mL | INTRAMUSCULAR | Status: DC | PRN
  Administered 2013-11-25: 10 mL via INTRAVENOUS
  Filled 2013-11-25: qty 10

## 2013-11-25 MED ORDER — HEPARIN SOD (PORK) LOCK FLUSH 100 UNIT/ML IV SOLN
500.0000 [IU] | Freq: Once | INTRAVENOUS | Status: AC
  Administered 2013-11-25: 500 [IU] via INTRAVENOUS
  Filled 2013-11-25: qty 5

## 2013-11-25 MED ORDER — FUROSEMIDE 20 MG PO TABS: 20.0000 mg | ORAL_TABLET | Freq: Every day | ORAL | Status: DC

## 2013-11-25 NOTE — Progress Notes (Signed)
[facility] OFFICE PROGRESS NOTE  Patient Care Team: Kelton Pillar, MD as PCP - General Hart Rochester, MD as Referring Physician (Obstetrics and Gynecology) Edward Jolly, MD as Consulting Physician (General Surgery)  DIAGNOSIS: CLL with 17 deletion, ongoing chemotherapy  SUMMARY OF ONCOLOGIC HISTORY: The patient was hospitalized from 10/07/2013 to 10/15/2013 because of recent fall due to weakness. She was also found to have acute renal failure and severe hypercalcemia as well as progressive leukocytosis. CT scan showed significant lymphadenopathy throughout with hypercalcemia. I reviewed her records extensively as documented by Dr. Azucena Freed note dated February 23rd 2015.  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  She also had history of stage I uterine cancer that was treated surgically followed by postoperative chemotherapy and brachytherapy.  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 After her treatment was completed, I did not see a repeat imaging study done. Her blood cell count went back to normal however over the past 6 months, has started to rise.   We started her on Ofatumumab week ago on 10/14/2013 with resolution of her hypercalcemia and improvement of the white blood cell count. Since she was discharged, she still feels weak but overall felt improved. On 10/23/2013, she was started on Ibrutinib.  INTERVAL HISTORY: Margaret Mann 80 y.o. female returns for other followup. Apart from bruising she denies any bleeding. The patient denies any recent signs or symptoms of bleeding such as spontaneous epistaxis, hematuria or hematochezia. She denies any recent fever, chills, night sweats or abnormal weight loss She has excellent energy level. Another new complaint is mild leg swelling. She denies any  diarrhea. I have reviewed the past medical history, past surgical history, social history and family history with the patient and they are unchanged from previous note.  ALLERGIES:  is allergic to dilaudid.  MEDICATIONS:  Current Outpatient Prescriptions  Medication Sig Dispense Refill  . acyclovir (ZOVIRAX) 400 MG tablet Take 1 tablet (400 mg total) by mouth daily.  30 tablet  3  . allopurinol (ZYLOPRIM) 300 MG tablet Take 1 tablet (300 mg total) by mouth daily.  30 tablet  1  . aspirin EC 81 MG tablet Take 81 mg by mouth daily.      Marland Kitchen ibrutinib (IMBRUVICA) 140 MG capsul Take 3 capsules (420 mg total) by mouth daily.  90 capsule  0  . furosemide (LASIX) 20 MG tablet Take 1 tablet (20 mg total) by mouth daily.  30 tablet  0   Current Facility-Administered Medications  Medication Dose Route Frequency Provider Last Rate Last Dose  . sodium chloride 0.9 % injection 10 mL  10 mL Intravenous PRN Heath Lark, MD   10 mL at 11/25/13 1440   Facility-Administered Medications Ordered in Other Visits  Medication Dose Route Frequency Provider Last Rate Last Dose  . sodium chloride 0.9 % injection 10 mL  10 mL Intravenous PRN Heath Lark, MD   10 mL at 10/23/13 1518  . sodium chloride 0.9 % injection 10 mL  10 mL Intravenous PRN Heath Lark, MD   10 mL at 11/04/13 1229  . sodium chloride 0.9 % injection 10 mL  10 mL Intravenous PRN Heath Lark, MD   10 mL at 11/25/13 1356    REVIEW OF SYSTEMS:   Constitutional: Denies fevers, chills or abnormal weight loss Eyes: Denies blurriness of  vision Ears, nose, mouth, throat, and face: Denies mucositis or sore throat Respiratory: Denies cough, dyspnea or wheezes Cardiovascular: Denies palpitation, chest discomfort  Gastrointestinal:  Denies nausea, heartburn or change in bowel habits Skin: Denies abnormal skin rashes Lymphatics: Denies new lymphadenopathy Neurological:Denies numbness, tingling or new weaknesses Behavioral/Psych: Mood is stable, no new changes   All other systems were reviewed with the patient and are negative.  PHYSICAL EXAMINATION: ECOG PERFORMANCE STATUS: 0 - Asymptomatic  Filed Vitals:   11/25/13 1417  BP: 137/74  Pulse: 84  Temp: 98 F (36.7 C)  Resp: 20   Filed Weights   11/25/13 1417  Weight: 152 lb 1.6 oz (68.992 kg)    GENERAL:alert, no distress and comfortable SKIN: skin color, texture, turgor are normal, no rashes or significant lesions EYES: normal, Conjunctiva are pink and non-injected, sclera clear OROPHARYNX:no exudate, no erythema and lips, buccal mucosa, and tongue normal  NECK: supple, thyroid normal size, non-tender, without nodularity. Previously palpable fullness in her neck has resolved.  LYMPH:  no palpable lymphadenopathy in the cervical, axillary or inguinal LUNGS: clear to auscultation and percussion with normal breathing effort HEART: regular rate & rhythm and no murmurs with mild bilateral lower extremity edema ABDOMEN:abdomen soft, non-tender and normal bowel sounds Musculoskeletal:no cyanosis of digits and no clubbing  NEURO: alert & oriented x 3 with fluent speech, no focal motor/sensory deficits  LABORATORY DATA:  I have reviewed the data as listed    Component Value Date/Time   NA 141 11/25/2013 1356   NA 141 10/15/2013 0423   K 3.5 11/25/2013 1356   K 3.7 10/15/2013 0423   CL 95* 10/15/2013 0423   CL 101 11/05/2012 0923   CO2 26 11/25/2013 1356   CO2 37* 10/15/2013 0423   GLUCOSE 90 11/25/2013 1356   GLUCOSE 144* 10/15/2013 0423   GLUCOSE 93 11/05/2012 0923   BUN 10.8 11/25/2013 1356   BUN 16 10/15/2013 0423   CREATININE 0.7 11/25/2013 1356   CREATININE 0.64 10/15/2013 0423   CALCIUM 8.8 11/25/2013 1356   CALCIUM 8.3* 10/15/2013 0423   CALCIUM 10.1 10/08/2013 0350   PROT 5.8* 11/25/2013 1356   PROT 5.5* 10/15/2013 0423   ALBUMIN 2.9* 11/25/2013 1356   ALBUMIN 2.5* 10/15/2013 0423   AST 17 11/25/2013 1356   AST 64* 10/15/2013 0423   ALT 7 11/25/2013 1356   ALT 40* 10/15/2013 0423   ALKPHOS 89  11/25/2013 1356   ALKPHOS 154* 10/15/2013 0423   BILITOT 0.39 11/25/2013 1356   BILITOT 0.6 10/15/2013 0423   GFRNONAA 83* 10/15/2013 0423   GFRAA >90 10/15/2013 0423    No results found for this basename: SPEP,  UPEP,   kappa and lambda light chains    Lab Results  Component Value Date   WBC 8.0 11/25/2013   NEUTROABS 2.8 11/25/2013   HGB 9.1* 11/25/2013   HCT 29.0* 11/25/2013   MCV 89.4 11/25/2013   PLT 294 11/25/2013      Chemistry      Component Value Date/Time   NA 141 11/25/2013 1356   NA 141 10/15/2013 0423   K 3.5 11/25/2013 1356   K 3.7 10/15/2013 0423   CL 95* 10/15/2013 0423   CL 101 11/05/2012 0923   CO2 26 11/25/2013 1356   CO2 37* 10/15/2013 0423   BUN 10.8 11/25/2013 1356   BUN 16 10/15/2013 0423   CREATININE 0.7 11/25/2013 1356   CREATININE 0.64 10/15/2013 0423      Component Value Date/Time  CALCIUM 8.8 11/25/2013 1356   CALCIUM 8.3* 10/15/2013 0423   CALCIUM 10.1 10/08/2013 0350   ALKPHOS 89 11/25/2013 1356   ALKPHOS 154* 10/15/2013 0423   AST 17 11/25/2013 1356   AST 64* 10/15/2013 0423   ALT 7 11/25/2013 1356   ALT 40* 10/15/2013 0423   BILITOT 0.39 11/25/2013 1356   BILITOT 0.6 10/15/2013 0423     ASSESSMENT & PLAN:  #1 CLL with bulky lymphadenopathy, resolved Palliative therapy was started on 4/6.  She started on Ibrutinib on 10/23/2013. She had good response with normalization of her white blood cell count.. Her performance status has improved. She will have repeat blood work in 2 weeks and see me back in one month. #2 Recurrent hypercalcemia, resolved PTH level is low and this is compatible with malignancy.  She responded well with high-dose steroids and fluids resuscitation.  #3 liver metastasis with abnormal liver function test, resolved  This is related to hepatic metastasis. It is unusual to see hepatic metastasis from CLL, though not impossible. In the scope of things,would not recommend liver biopsy right now.  Her liver function tests is improving and is now back to  normal. I will repeat CT scan in a few months to check on this. #4 history of DCIS & history of uterine cancer  Recent mammography and biopsy confirmed no evidence of disease recurrence  #5 tumor lysis prophylaxis  She responded well with recent IV fluids. She will continue allopurinol.  #6 Recent abnormal MRI brain  Continue observation. Due for another MRI in 6-8 weeks.Likely related to recent fall. Patient had an episode of nosebleed last week, resolved. Her platelet count is Stable.  #7 Antimicrobial prophylaxis  On acyclovir 400 mg daily  #8 Thrombocytopenia, resolved #9 Anemia This is likely due to recent treatment. The patient denies recent history of bleeding such as epistaxis, hematuria or hematochezia. She is asymptomatic from the anemia. I will observe for now.  She does not require transfusion now. I will continue the chemotherapy at current dose without dosage adjustment.  If the anemia gets progressive worse in the future, I might have to delay her treatment or adjust the chemotherapy dose. I discussed with the patient the possibility of needing blood transfusion and she consented to be transfused if her hemoglobin dropped to less than 7.5 g. #10 leg swelling This is due to fluid retention. I will start her on 20 mg of furosemide daily and we'll recheck a serum chemistries in 2 weeks.   All questions were answered. The patient knows to call the clinic with any problems, questions or concerns. No barriers to learning was detected. I spent 25 minutes counseling the patient face to face. The total time spent in the appointment was 30 minutes and more than 50% was on counseling and review of test results     Heath Lark, MD 11/25/2013 2:56 PM

## 2013-11-25 NOTE — Patient Instructions (Signed)

## 2013-11-25 NOTE — Telephone Encounter (Signed)
Gave pt appt for lab and MD for June 2015 °

## 2013-11-26 ENCOUNTER — Telehealth: Payer: Self-pay | Admitting: Hematology and Oncology

## 2013-11-26 NOTE — Telephone Encounter (Signed)
Talked to pt , she is aware of flush on 6/15

## 2013-12-02 ENCOUNTER — Other Ambulatory Visit: Payer: Self-pay | Admitting: Hematology and Oncology

## 2013-12-03 ENCOUNTER — Other Ambulatory Visit: Payer: Medicare Other

## 2013-12-03 ENCOUNTER — Ambulatory Visit: Payer: Medicare Other

## 2013-12-06 ENCOUNTER — Telehealth: Payer: Self-pay | Admitting: Medical Oncology

## 2013-12-06 NOTE — Telephone Encounter (Signed)
Per MD, informed patient can stop allopurinol when current script is finished. Patient gave verbal understanding, no further questions.

## 2013-12-06 NOTE — Telephone Encounter (Signed)
Patient calling to know whether she is to continue with taking Allopurinol and if so she needs a new prescription.  MD inbox. F/u appt 06/15 lab/MD

## 2013-12-06 NOTE — Telephone Encounter (Signed)
No need, she can stop when current script is done

## 2013-12-09 ENCOUNTER — Other Ambulatory Visit (HOSPITAL_BASED_OUTPATIENT_CLINIC_OR_DEPARTMENT_OTHER): Payer: Medicare Other

## 2013-12-09 ENCOUNTER — Other Ambulatory Visit: Payer: Medicare Other

## 2013-12-09 ENCOUNTER — Telehealth: Payer: Self-pay | Admitting: *Deleted

## 2013-12-09 DIAGNOSIS — C9110 Chronic lymphocytic leukemia of B-cell type not having achieved remission: Secondary | ICD-10-CM

## 2013-12-09 LAB — COMPREHENSIVE METABOLIC PANEL (CC13)
ALBUMIN: 3.4 g/dL — AB (ref 3.5–5.0)
ALK PHOS: 87 U/L (ref 40–150)
ALT: 8 U/L (ref 0–55)
AST: 19 U/L (ref 5–34)
Anion Gap: 14 mEq/L — ABNORMAL HIGH (ref 3–11)
BUN: 13.3 mg/dL (ref 7.0–26.0)
CO2: 26 mEq/L (ref 22–29)
Calcium: 9.2 mg/dL (ref 8.4–10.4)
Chloride: 102 mEq/L (ref 98–109)
Creatinine: 0.7 mg/dL (ref 0.6–1.1)
Glucose: 93 mg/dl (ref 70–140)
POTASSIUM: 3.4 meq/L — AB (ref 3.5–5.1)
SODIUM: 142 meq/L (ref 136–145)
Total Bilirubin: 0.6 mg/dL (ref 0.20–1.20)
Total Protein: 6.5 g/dL (ref 6.4–8.3)

## 2013-12-09 LAB — CBC WITH DIFFERENTIAL/PLATELET
BASO%: 0.3 % (ref 0.0–2.0)
Basophils Absolute: 0 10*3/uL (ref 0.0–0.1)
EOS ABS: 0.1 10*3/uL (ref 0.0–0.5)
EOS%: 1.3 % (ref 0.0–7.0)
HCT: 37.6 % (ref 34.8–46.6)
HGB: 11.8 g/dL (ref 11.6–15.9)
LYMPH%: 42.2 % (ref 14.0–49.7)
MCH: 27.5 pg (ref 25.1–34.0)
MCHC: 31.5 g/dL (ref 31.5–36.0)
MCV: 87.3 fL (ref 79.5–101.0)
MONO#: 0.6 10*3/uL (ref 0.1–0.9)
MONO%: 7.5 % (ref 0.0–14.0)
NEUT%: 48.7 % (ref 38.4–76.8)
NEUTROS ABS: 3.9 10*3/uL (ref 1.5–6.5)
PLATELETS: 211 10*3/uL (ref 145–400)
RBC: 4.31 10*6/uL (ref 3.70–5.45)
RDW: 19.6 % — ABNORMAL HIGH (ref 11.2–14.5)
WBC: 8.1 10*3/uL (ref 3.9–10.3)
lymph#: 3.4 10*3/uL — ABNORMAL HIGH (ref 0.9–3.3)

## 2013-12-09 NOTE — Telephone Encounter (Signed)
Informed pt of labs today are fantastic per Dr. Alvy Bimler.  Keep appt in 2 weeks as scheduled. Pt verbalized understanding.

## 2013-12-09 NOTE — Telephone Encounter (Signed)
Message copied by Cathlean Cower on Mon Dec 09, 2013 10:37 AM ------      Message from: [facility], [place]: Mon Dec 09, 2013 10:32 AM      Regarding: test result       Pls let her know labs look fantastic, no need to worry until next visit      ----- Message -----         From: [facility]         Sent: 12/09/2013   9:39 AM           To: Heath Lark, MD                   ------

## 2013-12-23 ENCOUNTER — Other Ambulatory Visit: Payer: Self-pay

## 2013-12-23 ENCOUNTER — Encounter: Payer: Self-pay | Admitting: Hematology and Oncology

## 2013-12-23 ENCOUNTER — Encounter (HOSPITAL_COMMUNITY): Payer: Self-pay | Admitting: Radiology

## 2013-12-23 ENCOUNTER — Telehealth: Payer: Self-pay | Admitting: *Deleted

## 2013-12-23 ENCOUNTER — Other Ambulatory Visit (HOSPITAL_BASED_OUTPATIENT_CLINIC_OR_DEPARTMENT_OTHER): Payer: Medicare Other

## 2013-12-23 ENCOUNTER — Inpatient Hospital Stay (HOSPITAL_COMMUNITY): Payer: Medicare Other

## 2013-12-23 ENCOUNTER — Ambulatory Visit: Payer: Medicare Other

## 2013-12-23 ENCOUNTER — Encounter (HOSPITAL_COMMUNITY): Payer: Self-pay

## 2013-12-23 ENCOUNTER — Ambulatory Visit (HOSPITAL_BASED_OUTPATIENT_CLINIC_OR_DEPARTMENT_OTHER): Payer: Medicare Other | Admitting: Hematology and Oncology

## 2013-12-23 ENCOUNTER — Telehealth: Payer: Self-pay | Admitting: Hematology and Oncology

## 2013-12-23 ENCOUNTER — Inpatient Hospital Stay (HOSPITAL_COMMUNITY)
Admission: AD | Admit: 2013-12-23 | Discharge: 2013-12-27 | DRG: 640 | Disposition: A | Discharge status: Hospice | Payer: Medicare Other | Source: Ambulatory Visit | Attending: Internal Medicine | Admitting: Internal Medicine

## 2013-12-23 VITALS — BP 152/82 | HR 86 | Temp 98.0°F | Resp 16 | Ht 64.0 in | Wt 140.6 lb

## 2013-12-23 VITALS — BP 152/82 | HR 86 | Temp 98.0°F | Resp 16

## 2013-12-23 DIAGNOSIS — I509 Heart failure, unspecified: Secondary | ICD-10-CM | POA: Diagnosis present

## 2013-12-23 DIAGNOSIS — Z87891 Personal history of nicotine dependence: Secondary | ICD-10-CM

## 2013-12-23 DIAGNOSIS — Z7982 Long term (current) use of aspirin: Secondary | ICD-10-CM | POA: Diagnosis not present

## 2013-12-23 DIAGNOSIS — C9110 Chronic lymphocytic leukemia of B-cell type not having achieved remission: Secondary | ICD-10-CM

## 2013-12-23 DIAGNOSIS — Z888 Allergy status to other drugs, medicaments and biological substances status: Secondary | ICD-10-CM | POA: Diagnosis not present

## 2013-12-23 DIAGNOSIS — T502X5A Adverse effect of carbonic-anhydrase inhibitors, benzothiadiazides and other diuretics, initial encounter: Secondary | ICD-10-CM | POA: Diagnosis present

## 2013-12-23 DIAGNOSIS — Z66 Do not resuscitate: Secondary | ICD-10-CM | POA: Diagnosis present

## 2013-12-23 DIAGNOSIS — G9349 Other encephalopathy: Secondary | ICD-10-CM | POA: Diagnosis present

## 2013-12-23 DIAGNOSIS — E876 Hypokalemia: Secondary | ICD-10-CM | POA: Diagnosis present

## 2013-12-23 DIAGNOSIS — Z923 Personal history of irradiation: Secondary | ICD-10-CM | POA: Diagnosis not present

## 2013-12-23 DIAGNOSIS — E860 Dehydration: Secondary | ICD-10-CM | POA: Diagnosis present

## 2013-12-23 DIAGNOSIS — D649 Anemia, unspecified: Secondary | ICD-10-CM | POA: Diagnosis present

## 2013-12-23 DIAGNOSIS — Z853 Personal history of malignant neoplasm of breast: Secondary | ICD-10-CM

## 2013-12-23 DIAGNOSIS — Z8611 Personal history of tuberculosis: Secondary | ICD-10-CM

## 2013-12-23 DIAGNOSIS — M25559 Pain in unspecified hip: Secondary | ICD-10-CM | POA: Diagnosis present

## 2013-12-23 DIAGNOSIS — Z515 Encounter for palliative care: Secondary | ICD-10-CM | POA: Diagnosis not present

## 2013-12-23 DIAGNOSIS — I5030 Unspecified diastolic (congestive) heart failure: Secondary | ICD-10-CM | POA: Diagnosis present

## 2013-12-23 DIAGNOSIS — I10 Essential (primary) hypertension: Secondary | ICD-10-CM | POA: Diagnosis present

## 2013-12-23 DIAGNOSIS — Z8542 Personal history of malignant neoplasm of other parts of uterus: Secondary | ICD-10-CM

## 2013-12-23 DIAGNOSIS — C9210 Chronic myeloid leukemia, BCR/ABL-positive, not having achieved remission: Secondary | ICD-10-CM

## 2013-12-23 DIAGNOSIS — N179 Acute kidney failure, unspecified: Secondary | ICD-10-CM | POA: Diagnosis present

## 2013-12-23 DIAGNOSIS — R531 Weakness: Secondary | ICD-10-CM

## 2013-12-23 DIAGNOSIS — H9190 Unspecified hearing loss, unspecified ear: Secondary | ICD-10-CM | POA: Diagnosis present

## 2013-12-23 DIAGNOSIS — Z95828 Presence of other vascular implants and grafts: Secondary | ICD-10-CM

## 2013-12-23 DIAGNOSIS — E440 Moderate protein-calorie malnutrition: Secondary | ICD-10-CM | POA: Diagnosis present

## 2013-12-23 DIAGNOSIS — Z9089 Acquired absence of other organs: Secondary | ICD-10-CM | POA: Diagnosis not present

## 2013-12-23 DIAGNOSIS — IMO0002 Reserved for concepts with insufficient information to code with codable children: Secondary | ICD-10-CM | POA: Diagnosis not present

## 2013-12-23 DIAGNOSIS — I4510 Unspecified right bundle-branch block: Secondary | ICD-10-CM | POA: Diagnosis present

## 2013-12-23 DIAGNOSIS — F411 Generalized anxiety disorder: Secondary | ICD-10-CM | POA: Diagnosis present

## 2013-12-23 DIAGNOSIS — Z79899 Other long term (current) drug therapy: Secondary | ICD-10-CM | POA: Diagnosis not present

## 2013-12-23 DIAGNOSIS — Z808 Family history of malignant neoplasm of other organs or systems: Secondary | ICD-10-CM | POA: Diagnosis not present

## 2013-12-23 DIAGNOSIS — R32 Unspecified urinary incontinence: Secondary | ICD-10-CM | POA: Diagnosis present

## 2013-12-23 DIAGNOSIS — E785 Hyperlipidemia, unspecified: Secondary | ICD-10-CM | POA: Diagnosis present

## 2013-12-23 DIAGNOSIS — Z800 Family history of malignant neoplasm of digestive organs: Secondary | ICD-10-CM | POA: Diagnosis not present

## 2013-12-23 DIAGNOSIS — R451 Restlessness and agitation: Secondary | ICD-10-CM | POA: Diagnosis present

## 2013-12-23 DIAGNOSIS — G9340 Encephalopathy, unspecified: Secondary | ICD-10-CM | POA: Diagnosis not present

## 2013-12-23 DIAGNOSIS — I519 Heart disease, unspecified: Secondary | ICD-10-CM | POA: Diagnosis present

## 2013-12-23 DIAGNOSIS — M25552 Pain in left hip: Secondary | ICD-10-CM

## 2013-12-23 DIAGNOSIS — Z9221 Personal history of antineoplastic chemotherapy: Secondary | ICD-10-CM

## 2013-12-23 DIAGNOSIS — Z87898 Personal history of other specified conditions: Secondary | ICD-10-CM

## 2013-12-23 LAB — COMPREHENSIVE METABOLIC PANEL (CC13)
ALT: 9 U/L (ref 0–55)
AST: 23 U/L (ref 5–34)
Albumin: 3.4 g/dL — ABNORMAL LOW (ref 3.5–5.0)
Alkaline Phosphatase: 97 U/L (ref 40–150)
Anion Gap: 12 mEq/L — ABNORMAL HIGH (ref 3–11)
BUN: 28.3 mg/dL — AB (ref 7.0–26.0)
CO2: 35 mEq/L — ABNORMAL HIGH (ref 22–29)
Calcium: 16.4 mg/dL (ref 8.4–10.4)
Chloride: 96 mEq/L — ABNORMAL LOW (ref 98–109)
Creatinine: 1.1 mg/dL (ref 0.6–1.1)
GLUCOSE: 120 mg/dL (ref 70–140)
Potassium: 3.2 mEq/L — ABNORMAL LOW (ref 3.5–5.1)
Sodium: 144 mEq/L (ref 136–145)
Total Bilirubin: 0.76 mg/dL (ref 0.20–1.20)
Total Protein: 6.9 g/dL (ref 6.4–8.3)

## 2013-12-23 LAB — URINALYSIS, ROUTINE W REFLEX MICROSCOPIC
Bilirubin Urine: NEGATIVE
Glucose, UA: NEGATIVE mg/dL
Hgb urine dipstick: NEGATIVE
Ketones, ur: NEGATIVE mg/dL
Nitrite: NEGATIVE
Protein, ur: NEGATIVE mg/dL
Specific Gravity, Urine: 1.008 (ref 1.005–1.030)
Urobilinogen, UA: 0.2 mg/dL (ref 0.0–1.0)
pH: 7 (ref 5.0–8.0)

## 2013-12-23 LAB — MAGNESIUM: Magnesium: 1.6 mg/dL (ref 1.5–2.5)

## 2013-12-23 LAB — CBC WITH DIFFERENTIAL/PLATELET
BASO%: 0.7 % (ref 0.0–2.0)
Basophils Absolute: 0.1 10*3/uL (ref 0.0–0.1)
EOS ABS: 0 10*3/uL (ref 0.0–0.5)
EOS%: 0.3 % (ref 0.0–7.0)
HCT: 42 % (ref 34.8–46.6)
HGB: 13.1 g/dL (ref 11.6–15.9)
LYMPH%: 30.9 % (ref 14.0–49.7)
MCH: 27 pg (ref 25.1–34.0)
MCHC: 31.2 g/dL — AB (ref 31.5–36.0)
MCV: 86.6 fL (ref 79.5–101.0)
MONO#: 0.8 10*3/uL (ref 0.1–0.9)
MONO%: 11.4 % (ref 0.0–14.0)
NEUT%: 56.7 % (ref 38.4–76.8)
NEUTROS ABS: 3.9 10*3/uL (ref 1.5–6.5)
NRBC: 0 % (ref 0–0)
PLATELETS: 149 10*3/uL (ref 145–400)
RBC: 4.85 10*6/uL (ref 3.70–5.45)
RDW: 16.6 % — AB (ref 11.2–14.5)
WBC: 6.9 10*3/uL (ref 3.9–10.3)
lymph#: 2.1 10*3/uL (ref 0.9–3.3)

## 2013-12-23 LAB — URINE MICROSCOPIC-ADD ON

## 2013-12-23 LAB — TECHNOLOGIST REVIEW

## 2013-12-23 MED ORDER — ONDANSETRON HCL 4 MG/2ML IJ SOLN
4.0000 mg | Freq: Four times a day (QID) | INTRAMUSCULAR | Status: DC | PRN
  Administered 2013-12-23: 4 mg via INTRAVENOUS
  Filled 2013-12-23: qty 2

## 2013-12-23 MED ORDER — HYDRALAZINE HCL 20 MG/ML IJ SOLN
10.0000 mg | Freq: Three times a day (TID) | INTRAMUSCULAR | Status: DC | PRN
  Administered 2013-12-23: 10 mg via INTRAVENOUS
  Filled 2013-12-23: qty 0.5

## 2013-12-23 MED ORDER — ONDANSETRON HCL 4 MG PO TABS: 4.0000 mg | ORAL_TABLET | Freq: Four times a day (QID) | ORAL | Status: DC | PRN

## 2013-12-23 MED ORDER — ACYCLOVIR 400 MG PO TABS
400.0000 mg | ORAL_TABLET | Freq: Every day | ORAL | Status: DC
  Administered 2013-12-23 – 2013-12-25 (×3): 400 mg via ORAL
  Filled 2013-12-23 (×3): qty 1

## 2013-12-23 MED ORDER — TRAMADOL HCL 50 MG PO TABS: 50.0000 mg | ORAL_TABLET | Freq: Four times a day (QID) | ORAL | Status: DC | PRN

## 2013-12-23 MED ORDER — SODIUM CHLORIDE 0.9 % IV SOLN
90.0000 mg | Freq: Once | INTRAVENOUS | Status: AC
  Administered 2013-12-23: 90 mg via INTRAVENOUS
  Filled 2013-12-23: qty 10

## 2013-12-23 MED ORDER — ENOXAPARIN SODIUM 40 MG/0.4ML ~~LOC~~ SOLN
40.0000 mg | SUBCUTANEOUS | Status: DC
  Administered 2013-12-23 – 2013-12-24 (×2): 40 mg via SUBCUTANEOUS
  Filled 2013-12-23 (×3): qty 0.4

## 2013-12-23 MED ORDER — ASPIRIN EC 81 MG PO TBEC
81.0000 mg | DELAYED_RELEASE_TABLET | Freq: Every day | ORAL | Status: DC
  Administered 2013-12-23 – 2013-12-25 (×3): 81 mg via ORAL
  Filled 2013-12-23 (×3): qty 1

## 2013-12-23 MED ORDER — IOHEXOL 300 MG/ML  SOLN
50.0000 mL | Freq: Once | INTRAMUSCULAR | Status: AC | PRN
  Administered 2013-12-23: 50 mL via ORAL

## 2013-12-23 MED ORDER — SODIUM CHLORIDE 0.9 % IV SOLN
INTRAVENOUS | Status: DC
  Administered 2013-12-23: 21:00:00 via INTRAVENOUS
  Administered 2013-12-23: 125 mL/h via INTRAVENOUS
  Administered 2013-12-24: 23:00:00 via INTRAVENOUS
  Administered 2013-12-24 (×2): 125 mL/h via INTRAVENOUS
  Administered 2013-12-24: 200 mL/h via INTRAVENOUS
  Administered 2013-12-25: 04:00:00 via INTRAVENOUS
  Administered 2013-12-25 (×2): 200 mL/h via INTRAVENOUS
  Administered 2013-12-25 – 2013-12-26 (×3): via INTRAVENOUS

## 2013-12-23 MED ORDER — ACETAMINOPHEN 325 MG PO TABS
650.0000 mg | ORAL_TABLET | Freq: Four times a day (QID) | ORAL | Status: DC | PRN
  Administered 2013-12-23: 650 mg via ORAL
  Filled 2013-12-23: qty 2

## 2013-12-23 MED ORDER — IOHEXOL 300 MG/ML  SOLN
100.0000 mL | Freq: Once | INTRAMUSCULAR | Status: AC | PRN
  Administered 2013-12-23: 100 mL via INTRAVENOUS

## 2013-12-23 MED ORDER — POTASSIUM CHLORIDE CRYS ER 20 MEQ PO TBCR
40.0000 meq | EXTENDED_RELEASE_TABLET | Freq: Two times a day (BID) | ORAL | Status: AC
  Administered 2013-12-23 (×2): 40 meq via ORAL
  Filled 2013-12-23 (×2): qty 2

## 2013-12-23 MED ORDER — ACETAMINOPHEN 650 MG RE SUPP: 650.0000 mg | Freq: Four times a day (QID) | RECTAL | Status: DC | PRN

## 2013-12-23 MED ORDER — SODIUM CHLORIDE 0.9 % IJ SOLN
10.0000 mL | INTRAMUSCULAR | Status: DC | PRN
  Administered 2013-12-25 – 2013-12-27 (×3): 10 mL

## 2013-12-23 MED ORDER — PREDNISONE 20 MG PO TABS: 20.0000 mg | ORAL_TABLET | Freq: Every day | ORAL | Status: DC

## 2013-12-23 MED ORDER — METHYLPREDNISOLONE SODIUM SUCC 125 MG IJ SOLR
60.0000 mg | Freq: Every day | INTRAMUSCULAR | Status: DC
  Administered 2013-12-23 – 2013-12-24 (×2): 60 mg via INTRAVENOUS
  Filled 2013-12-23 (×2): qty 0.96

## 2013-12-23 MED ORDER — SODIUM CHLORIDE 0.9 % IV BOLUS (SEPSIS)
500.0000 mL | Freq: Once | INTRAVENOUS | Status: AC
  Administered 2013-12-23: 500 mL via INTRAVENOUS

## 2013-12-23 MED ORDER — HEPARIN SOD (PORK) LOCK FLUSH 100 UNIT/ML IV SOLN
500.0000 [IU] | Freq: Once | INTRAVENOUS | Status: AC
  Administered 2013-12-23: 500 [IU] via INTRAVENOUS
  Filled 2013-12-23: qty 5

## 2013-12-23 MED ORDER — SODIUM CHLORIDE 0.9 % IJ SOLN: 10.0000 mL | Freq: Two times a day (BID) | INTRAMUSCULAR | Status: DC

## 2013-12-23 MED ORDER — SODIUM CHLORIDE 0.9 % IJ SOLN
10.0000 mL | INTRAMUSCULAR | Status: DC | PRN
  Administered 2013-12-23: 10 mL via INTRAVENOUS
  Filled 2013-12-23: qty 10

## 2013-12-23 NOTE — H&P (Signed)
[facility] History and Physical  Margaret Mann EQA:[id number] DOB: 1933/06/19 DOA: 12/23/2013  Referring physician: Dr Biagio Borg.  PCP: Osborne Casco, MD   Chief Complaint:   HPI: Margaret Mann is a 80 y.o. female with PMH CLL, prior history of hypercalcemia, uterine cancer, Anemia, CHF diastolic dysfunction, Breast cancer,  Who presents for routine follow up to Dr Alvy Bimler office found to have calcium level at 16. Patient also relates left side hip pain that started 4 days prior to admission. She relates some difficulty with ambulation due to pain. She is notice to be some what confuse and with slow response time by husband. She denies chest pain, dyspnea, diarrhea, nausea or vomiting. She had light headache on Friday. She does not take vitamin supplements.    Review of Systems:  Negative, except as per HPI.   Past Medical History  Diagnosis Date  . Hypertension   . Hyperlipemia   . RBBB   . Lymphocytic leukemia   . Breast cancer     rt lump-08,lt lump 14  . Arthritis   . Tuberculosis     as a teen-was treated  . CHF (congestive heart failure)     hx 04-cardiac work up 2010-no signs chf  . HOH (hard of hearing)   . Anemia   . Osteopenia    Past Surgical History  Procedure Laterality Date  . Biopsy breast  2008    rt  . Endometrial biopsy  2010  . Appendectomy    . Ganglion cyst excision  2011    left hand  . Diagnostic laparoscopy      node bx  . Abdominal hysterectomy  2010    BSO plus node bx  . Eye surgery      both cataracts  . Breast surgery  2008    rt lump-snbx  . Breast lumpectomy with needle localization Left 04/09/2013    Procedure: BREAST LUMPECTOMY WITH NEEDLE LOCALIZATION;  Surgeon: Edward Jolly, MD;  Location: [facility];  Service: General;  Laterality: Left;   Social History:  reports that she quit smoking about 51 years ago. She has never used smokeless tobacco. She reports that she does not drink alcohol or  use illicit drugs.  Allergies  Allergen Reactions  . Dilaudid [Hydromorphone Hcl] Nausea And Vomiting    Family History  Problem Relation Age of Onset  . Cancer Mother     colon  . Parkinson's disease Mother   . Cancer Father     brain  . Cancer Sister     liver mets  . Hypothyroidism Sister      Prior to Admission medications   Medication Sig Start Date End Date Taking? Authorizing Provider  acyclovir (ZOVIRAX) 400 MG tablet Take 1 tablet (400 mg total) by mouth daily. 10/15/13   Heath Lark, MD  aspirin EC 81 MG tablet Take 81 mg by mouth daily.    Historical Provider, MD  furosemide (LASIX) 20 MG tablet Take 1 tablet (20 mg total) by mouth daily. 11/25/13   Heath Lark, MD  ibrutinib (IMBRUVICA) 140 MG capsul Take 3 capsules (420 mg total) by mouth daily. 10/21/13   Heath Lark, MD  predniSONE (DELTASONE) 20 MG tablet Take 1 tablet (20 mg total) by mouth daily with breakfast. 12/23/13   Heath Lark, MD  traMADol (ULTRAM) 50 MG tablet Take 1 tablet (50 mg total) by mouth every 6 (six) hours as needed. 12/23/13   Heath Lark, MD   Physical Exam:  There were no vitals filed for this visit.  There were no vitals taken for this visit.  General:  Appears calm and comfortable Eyes: PERRL, normal lids, irises & conjunctiva ENT: grossly normal hearing, lips & tongue Neck: no LAD, masses or thyromegaly Cardiovascular: RRR, no m/r/g. No LE edema. Telemetry: SR, no arrhythmias  Respiratory: CTA bilaterally, no w/r/r. Normal respiratory effort. Abdomen: soft, ntnd Skin: no rash or induration seen on limited exam Musculoskeletal: grossly normal tone BUE/BLE Neurologic: grossly non-focal. Slow response time.           Labs on Admission:  Basic Metabolic Panel:  Recent Labs Lab 12/23/13 1031  NA 144  K 3.2*  CO2 35*  GLUCOSE 120  BUN 28.3*  CREATININE 1.1  CALCIUM 16.4 Repeated and Verified*   Liver Function Tests:  Recent Labs Lab 12/23/13 1031  AST 23  ALT 9  ALKPHOS 97    BILITOT 0.76  PROT 6.9  ALBUMIN 3.4*   No results found for this basename: LIPASE, AMYLASE,  in the last 168 hours No results found for this basename: AMMONIA,  in the last 168 hours CBC:  Recent Labs Lab 12/23/13 1032  WBC 6.9  NEUTROABS 3.9  HGB 13.1  HCT 42.0  MCV 86.6  PLT 149   Cardiac Enzymes: No results found for this basename: CKTOTAL, CKMB, CKMBINDEX, TROPONINI,  in the last 168 hours  BNP (last 3 results) No results found for this basename: PROBNP,  in the last 8760 hours CBG: No results found for this basename: GLUCAP,  in the last 168 hours  Radiological Exams on Admission: No results found.  EKG: ordered.   Assessment/Plan Active Problems:   Essential hypertension, benign   DIASTOLIC DYSFUNCTION   CLL (chronic lymphocytic leukemia)   Hypercalcemia   1-Hypercalcemia; likely of malignancy. Will check Vitamin D, PTH, PTH related peptide. IV fluids, start lasix after IV hydration likely tomorrow, IV solumedrol, IV pamidronate. CT chest, abdomen , pelvis to stage for malignancy, will order it for tomorrow after hydration. If ct unrevealing for malignancy will need SPEP, UPEP.   2-CLL; Will hold Imbruvica as advised by Dr Alvy Bimler.   3-Diastolic Dysfunction. No evidence of volume overdose. Resume lasix tomorrow after hydration for hypercalcemia treatment.   4-HTN; hold lasix. PRN hydralazine.  5-left hip pain; No recent fall. will start with X ray.  6-Hypokalemia; check mg. Replete with oral supplement.  7-Renal insufficiency; increase BUN and mild increase Cr from baseline. IV fluids.   Code Status: Presume full code.  Family Communication; Care discussed with Husband.  Disposition Plan: expect 3 to 4 days inpatient.   Time spent: 75 minutes.   Niel Hummer A [facility] Pager [phone]  **Disclaimer: This note may have been dictated with voice recognition software. Similar sounding words can inadvertently be transcribed and this note may  contain transcription errors which may not have been corrected upon publication of note.**

## 2013-12-23 NOTE — Telephone Encounter (Signed)
Called son to instruct to go to Admitting for pt to be admitted to [facility] by Hospitalist.  He states they are already at admitting and pt being registered now.

## 2013-12-23 NOTE — Patient Instructions (Signed)

## 2013-12-23 NOTE — Progress Notes (Signed)
Utilization review completed.  

## 2013-12-23 NOTE — Telephone Encounter (Signed)
gv and printed appt sched and avs for pt fro July...gv pt barium...was was going to be out of town on July 6th week

## 2013-12-23 NOTE — Telephone Encounter (Signed)
Prelim Calcium level is very high.  Dr. Alvy Bimler ordered for lab to be re drawn peripheral to confirm.  Called son and asked him to bring pt back to clinic for another lab draw.  Explained needs to be drawn from arm this time and not from [place] to make sure it is accurate.  Wait for results.  He verbalized understanding.

## 2013-12-23 NOTE — Assessment & Plan Note (Signed)
Likely due to malignant hypercalcemia. We will review that on CT scan.

## 2013-12-23 NOTE — Progress Notes (Signed)
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 She was also found to have acute renal failure and severe hypercalcemia as well as progressive leukocytosis. CT scan showed significant lymphadenopathy throughout with hypercalcemia. I reviewed her records extensively as documented by Dr. Azucena Freed note dated February 23rd 2015.  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  She also had history of stage I uterine cancer that was treated surgically followed by postoperative chemotherapy and brachytherapy.  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 After her treatment was completed, I did not see a repeat imaging study done. Her blood cell count went back to normal however over the past 6 months, has started to rise.   We started her on Ofatumumab week ago on 10/14/2013 with resolution of her hypercalcemia and improvement of the white blood cell count. Since she was discharged, she still feels weak but overall felt improved. On 10/23/2013, she was started on Ibrutinib.  INTERVAL HISTORY: Please see below for problem oriented charting. Her main complaint today is hip pain on the left.  REVIEW OF SYSTEMS:   Constitutional: Denies fevers, chills or abnormal weight loss Eyes: Denies blurriness of vision Ears, nose, mouth, throat, and face: Denies mucositis or sore throat Respiratory: Denies cough, dyspnea or wheezes Cardiovascular: Denies  palpitation, chest discomfort or lower extremity swelling Gastrointestinal:  Denies nausea, heartburn or change in bowel habits Skin: Denies abnormal skin rashes. She has extensive skin bruising Lymphatics: Denies new lymphadenopathy  Neurological:Denies numbness, tingling or new weaknesses Behavioral/Psych: Mood is stable, no new changes  All other systems were reviewed with the patient and are negative.  I have reviewed the past medical history, past surgical history, social history and family history with the patient and they are unchanged from previous note.  ALLERGIES:  is allergic to dilaudid.  MEDICATIONS:  No current facility-administered medications for this visit.   No current outpatient prescriptions on file.   Facility-Administered Medications Ordered in Other Visits  Medication Dose Route Frequency Provider Last Rate Last Dose  . 0.9 %  sodium chloride infusion   Intravenous Continuous Belkys A Regalado, MD      . sodium chloride 0.9 % injection 10 mL  10 mL Intravenous PRN Heath Lark, MD   10 mL at 10/23/13 1518  . sodium chloride 0.9 % injection 10 mL  10 mL Intravenous PRN Heath Lark, MD   10 mL at 11/04/13 1229    PHYSICAL EXAMINATION: ECOG PERFORMANCE STATUS: 2 - Symptomatic, <50% confined to bed  Filed Vitals:   12/23/13 1052  BP: 152/82  Pulse: 86  Temp: 98 F (36.7 C)  Resp: 16   Filed Weights   12/23/13 1052  Weight: 140 lb 9.6 oz (63.776 kg)    GENERAL:alert, no distress and comfortable. She looks thin but not cachectic SKIN: skin color, texture, turgor are normal, no rashes or significant lesions. She had extensive bruises EYES: normal, Conjunctiva are pink and non-injected, sclera clear OROPHARYNX:no exudate, no erythema and lips, buccal mucosa, and tongue  normal  NECK: supple, thyroid normal size, non-tender, without nodularity LYMPH:  no palpable lymphadenopathy in the cervical, axillary or inguinal LUNGS: clear to auscultation and percussion  with normal breathing effort HEART: regular rate & rhythm and no murmurs and no lower extremity edema ABDOMEN:abdomen soft, non-tender and normal bowel sounds Musculoskeletal:no cyanosis of digits and no clubbing  NEURO: alert & oriented x 3 with fluent speech, no focal motor/sensory deficits  LABORATORY DATA:  I have reviewed the data as listed    Component Value Date/Time   NA 144 12/23/2013 1031   NA 141 10/15/2013 0423   K 3.2* 12/23/2013 1031   K 3.7 10/15/2013 0423   CL 95* 10/15/2013 0423   CL 101 11/05/2012 0923   CO2 35* 12/23/2013 1031   CO2 37* 10/15/2013 0423   GLUCOSE 120 12/23/2013 1031   GLUCOSE 144* 10/15/2013 0423   GLUCOSE 93 11/05/2012 0923   BUN 28.3* 12/23/2013 1031   BUN 16 10/15/2013 0423   CREATININE 1.1 12/23/2013 1031   CREATININE 0.64 10/15/2013 0423   CALCIUM 16.4 Repeated and Verified* 12/23/2013 1031   CALCIUM 8.3* 10/15/2013 0423   CALCIUM 10.1 10/08/2013 0350   PROT 6.9 12/23/2013 1031   PROT 5.5* 10/15/2013 0423   ALBUMIN 3.4* 12/23/2013 1031   ALBUMIN 2.5* 10/15/2013 0423   AST 23 12/23/2013 1031   AST 64* 10/15/2013 0423   ALT 9 12/23/2013 1031   ALT 40* 10/15/2013 0423   ALKPHOS 97 12/23/2013 1031   ALKPHOS 154* 10/15/2013 0423   BILITOT 0.76 12/23/2013 1031   BILITOT 0.6 10/15/2013 0423   GFRNONAA 83* 10/15/2013 0423   GFRAA >90 10/15/2013 0423    No results found for this basename: SPEP,  UPEP,   kappa and lambda light chains    Lab Results  Component Value Date   WBC 6.9 12/23/2013   NEUTROABS 3.9 12/23/2013   HGB 13.1 12/23/2013   HCT 42.0 12/23/2013   MCV 86.6 12/23/2013   PLT 149 12/23/2013      Chemistry      Component Value Date/Time   NA 144 12/23/2013 1031   NA 141 10/15/2013 0423   K 3.2* 12/23/2013 1031   K 3.7 10/15/2013 0423   CL 95* 10/15/2013 0423   CL 101 11/05/2012 0923   CO2 35* 12/23/2013 1031   CO2 37* 10/15/2013 0423   BUN 28.3* 12/23/2013 1031   BUN 16 10/15/2013 0423   CREATININE 1.1 12/23/2013 1031   CREATININE 0.64 10/15/2013 0423      Component Value  Date/Time   CALCIUM 16.4 Repeated and Verified* 12/23/2013 1031   CALCIUM 8.3* 10/15/2013 0423   CALCIUM 10.1 10/08/2013 0350   ALKPHOS 97 12/23/2013 1031   ALKPHOS 154* 10/15/2013 0423   AST 23 12/23/2013 1031   AST 64* 10/15/2013 0423   ALT 9 12/23/2013 1031   ALT 40* 10/15/2013 0423   BILITOT 0.76 12/23/2013 1031   BILITOT 0.6 10/15/2013 0423      ASSESSMENT & PLAN:  Hypercalcemia This is likely due to recurrence of disease. Her calcium level was checked twice and confirmed to be high. I have arranged for admission with a hospitalist. Plans of admission would be to bring her calcium level down with IV Solu-Medrol, IV fluids and Lasix. I recommend also IV pamidronate. I recommend her to have CT scan of the chest, abdomen and pelvis to restage her lymphoma. I will have a discussion with the patient and family once results are available. The only  treatment options left would be ICE chemotherapy. The patient is not a candidate for bone marrow transplant. Her prognosis is very poor, unlikely to survive without chemotherapy. With chemotherapy, responsive rates would be low with significant side effects. I will have more discussion with the patient once we have results available.  CLL (chronic lymphocytic leukemia) I would discontinue her chemotherapy.  Hip pain Likely due to malignant hypercalcemia. We will review that on CT scan.    Orders Placed This Encounter  Procedures  . CT Chest W Contrast    Standing Status: Future     Number of Occurrences:      Standing Expiration Date: 02/22/2015    Order Specific Question:  Reason for Exam (SYMPTOM  OR DIAGNOSIS REQUIRED)    Answer:  cll, assess response to Rx    Order Specific Question:  Preferred imaging location?    Answer:  [facility]  . CT Abdomen Pelvis W Contrast    Standing Status: Future     Number of Occurrences:      Standing Expiration Date: 03/25/2015    Order Specific Question:  Reason for Exam (SYMPTOM  OR DIAGNOSIS  REQUIRED)    Answer:  cll, assess response to Rx    Order Specific Question:  Preferred imaging location?    Answer:  [facility]  . CBC with Differential    Standing Status: Standing     Number of Occurrences: 6     Standing Expiration Date: 12/24/2014  . Comprehensive metabolic panel    Standing Status: Standing     Number of Occurrences: 6     Standing Expiration Date: 12/24/2014  . Lactate dehydrogenase    Standing Status: Standing     Number of Occurrences: 6     Standing Expiration Date: 12/24/2014   All questions were answered. The patient knows to call the clinic with any problems, questions or concerns. No barriers to learning was detected.    [facility], Door, MD 12/23/2013 2:13 PM

## 2013-12-23 NOTE — Assessment & Plan Note (Signed)
I would discontinue her chemotherapy.

## 2013-12-23 NOTE — Assessment & Plan Note (Signed)
This is likely due to recurrence of disease. Her calcium level was checked twice and confirmed to be high. I have arranged for admission with a hospitalist. Plans of admission would be to bring her calcium level down with IV Solu-Medrol, IV fluids and Lasix. I recommend also IV pamidronate. I recommend her to have CT scan of the chest, abdomen and pelvis to restage her lymphoma. I will have a discussion with the patient and family once results are available. The only treatment options left would be ICE chemotherapy. The patient is not a candidate for bone marrow transplant. Her prognosis is very poor, unlikely to survive without chemotherapy. With chemotherapy, responsive rates would be low with significant side effects. I will have more discussion with the patient once we have results available.

## 2013-12-24 DIAGNOSIS — E876 Hypokalemia: Secondary | ICD-10-CM

## 2013-12-24 DIAGNOSIS — Z7901 Long term (current) use of anticoagulants: Secondary | ICD-10-CM

## 2013-12-24 LAB — BASIC METABOLIC PANEL
BUN: 31 mg/dL — AB (ref 6–23)
CHLORIDE: 95 meq/L — AB (ref 96–112)
CO2: 37 mEq/L — ABNORMAL HIGH (ref 19–32)
Calcium: >15 mg/dL (ref 8.4–10.5)
Creatinine, Ser: 1.18 mg/dL — ABNORMAL HIGH (ref 0.50–1.10)
GFR calc non Af Amer: 43 mL/min — ABNORMAL LOW (ref >90)
GFR, EST AFRICAN AMERICAN: 50 mL/min — AB (ref >90)
Glucose, Bld: 147 mg/dL — ABNORMAL HIGH (ref 70–99)
POTASSIUM: 3.2 meq/L — AB (ref 3.7–5.3)
SODIUM: 143 meq/L (ref 137–147)

## 2013-12-24 LAB — PTH, INTACT AND CALCIUM
Calcium, Total (PTH): >15 mg/dL (ref 8.4–10.5)
PTH: 4.8 pg/mL — ABNORMAL LOW (ref 14.0–72.0)

## 2013-12-24 LAB — CBC
HEMATOCRIT: 40.1 % (ref 36.0–46.0)
HEMOGLOBIN: 12.5 g/dL (ref 12.0–15.0)
MCH: 27.1 pg (ref 26.0–34.0)
MCHC: 31.2 g/dL (ref 30.0–36.0)
MCV: 86.8 fL (ref 78.0–100.0)
Platelets: 145 10*3/uL — ABNORMAL LOW (ref 150–400)
RBC: 4.62 MIL/uL (ref 3.87–5.11)
RDW: 16.4 % — ABNORMAL HIGH (ref 11.5–15.5)
WBC: 6.7 10*3/uL (ref 4.0–10.5)

## 2013-12-24 LAB — MAGNESIUM: Magnesium: 1.5 mg/dL (ref 1.5–2.5)

## 2013-12-24 MED ORDER — POTASSIUM CHLORIDE CRYS ER 20 MEQ PO TBCR
40.0000 meq | EXTENDED_RELEASE_TABLET | Freq: Two times a day (BID) | ORAL | Status: DC
  Administered 2013-12-24 – 2013-12-25 (×2): 40 meq via ORAL
  Filled 2013-12-24 (×3): qty 2

## 2013-12-24 MED ORDER — FUROSEMIDE 10 MG/ML IJ SOLN
20.0000 mg | Freq: Four times a day (QID) | INTRAMUSCULAR | Status: DC
  Administered 2013-12-24 – 2013-12-27 (×11): 20 mg via INTRAVENOUS
  Filled 2013-12-24 (×14): qty 2

## 2013-12-24 MED ORDER — METHYLPREDNISOLONE SODIUM SUCC 125 MG IJ SOLR
125.0000 mg | INTRAMUSCULAR | Status: DC
  Administered 2013-12-24: 125 mg via INTRAVENOUS
  Filled 2013-12-24 (×2): qty 2

## 2013-12-24 NOTE — Progress Notes (Signed)
CRITICAL VALUE ALERT  Critical value received:  Calcium greater than 15  Date of notification:  12/24/2013  Time of notification:  0555  Critical value read back:yes  Nurse who received alert:  Star Age, RN  MD notified (1st page):  Tylene Fantasia, NP  Time of first page:  [phone]  MD notified (2nd page):  Time of second page:  Responding MD:  Tylene Fantasia, NP  Time MD responded:  [phone]

## 2013-12-24 NOTE — Progress Notes (Signed)
CRITICAL VALUE ALERT  Critical value received:  Ca greater than 15  Date of notification:  12/24/13  Time of notification:  7322  Critical value read back:yes  Nurse who received alert:  Laverle Hobby, RN  MD notified (1st page):  C. Woods  Time of first page:  1336  MD notified (2nd page):  Time of second page:  Responding MD:  C. Sherral Hammers  Time MD responded:  1336

## 2013-12-24 NOTE — Progress Notes (Signed)
CRITICAL VALUE ALERT  Critical value received:  Calcium 16.3  Date of notification:  12/24/2013   Time of notification:  1943  Critical value read back:yes  Nurse who received alert:  Emmie Niemann   MD notified (1st page):  Spoke with Dr. Sherral Hammers  Time of first page:  1948  MD notified (2nd page):  Time of second page:  Responding MD:    Time MD responded:

## 2013-12-24 NOTE — Progress Notes (Addendum)
[facility] TEAM 1 - Stepdown/ICU TEAM Progress Note  JUHI LAGRANGE UDJ:[id number] DOB: May 09, 1933 DOA: 12/23/2013 PCP: Osborne Casco, MD  Admit HPI / Brief Narrative: Margaret Mann is a 80 y.o. WF PMHx  breast cancer, uterine cancer,  CLL, prior history of hypercalcemia,  Anemia, CHF diastolic dysfunction.  Who presents for routine follow up to Dr Alvy Bimler office found to have calcium level at 16. Patient also relates left side hip pain that started 4 days prior to admission. She relates some difficulty with ambulation due to pain. She is notice to be some what confuse and with slow response time by husband. She denies chest pain, dyspnea, diarrhea, nausea or vomiting. She had light headache on Friday. She does not take vitamin supplements SUMMARY OF ONCOLOGIC HISTORY:  The patient was hospitalized from 10/07/2013 to 10/15/2013 because of recent fall due to weakness. She was also found to have acute renal failure and severe hypercalcemia as well as progressive leukocytosis. CT scan showed significant lymphadenopathy throughout with hypercalcemia.  I reviewed her records extensively as documented by Dr. Azucena Freed note dated February 23rd 2015.  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  She also had history of stage I uterine cancer that was treated surgically followed by postoperative chemotherapy and brachytherapy.  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 After her treatment was completed, I did not see a repeat imaging study done. Her blood cell count went back to normal however over the past 6 months, has started to rise.  We started her on Ofatumumab week ago on 10/14/2013 with resolution of her hypercalcemia and improvement of the white blood cell count.  Since she was discharged, she still feels weak but overall felt improved.  On  10/23/2013, she was started on Ibrutinib.    HPI/Subjective: 6/16 patient alert, confused although does attempt to answer questions. Tends to perseverate, unable to comprehend simple explanations.  Assessment/Plan:  Hypercalcemia  -This was suspected to be secondary to disease recurrence.  Even though CT scan show disease improvement, I suspect she has resistant cells which is causing the hypercalcemia.  Her calcium level was checked twice and confirmed to be high.  She was admitted on 6/16 by the Hospitalist service.  Plans of admission are to bring her calcium level down with IV Solu-Medrol, IV fluids and Lasix. IV pamidronate was initiated as well. Current levels are >15 from 16.4 on 6/15.  Findings will be discussed with patient and family regarding prognosis. If treatment would be needed, the only treatment options left would be ICE chemotherapy, which I doubt she can tolerate. Another possibility would be Obinutuzumab, with response rates likely less than 20%.. The patient is not a candidate for bone marrow transplant. Another option would be hospice.  -Per Dr. Heath Lark (oncology) patient family requests that hypercalcemia be treated through the night, with palliative care consult placed for discussion of hospice plans.  -Increase normal saline 274ml/hr -Lasix IV 20 mg q 6 hr -Solu-Medrol 125 mg daily -Patient has received Pamidronate IV 90 mg on 6/15 -BMP q 8 hr    CLL (chronic lymphocytic leukemia)  -Her chemotherapy is discontinued.  -She was doing very well on Ibrutinib but now she has become resistant  -Palliative care consult placed  Hypokalemia  -K-Dur 40 mEq  BID    Hip pain  -Likely due to malignant hypercalcemia.  -  CT scan shows no acute osseous abnormalities except new vague sclerotic changes within the thoracic -spine that may reflect sequelae of recently treated disease.  -Continue current pain management   Acute renal Failure  -This is due to hypercalcemia.    -Continue IV fluids.   Diastolic CHF  -Monitor closely for fluid overload given that in order to control hypercalcemia have increase normal saline to 2 ml/hr. -Start patient on Lasix IV 20 mg q 6hr -Daily a.m. weight  -Strict I&O     Code Status: FULL Family Communication: no family present at time of exam Disposition Plan: Per palliative care and oncology    Consultants: Dr. Heath Lark (oncology)  Procedure/Significant Events: NA   Culture NA  Antibiotics: NA  DVT prophylaxis: Lovenox   Devices NA   LINES / TUBES:  4/6 implant port right chest   Continuous Infusions: . sodium chloride 125 mL/hr (12/24/13 1106)    Objective: VITAL SIGNS: Temp: 97.9 F (36.6 C) (06/16 1314) Temp src: Oral (06/16 1314) BP: 153/95 mmHg (06/16 1314) Pulse Rate: 81 (06/16 1314) SPO2; FIO2:   Intake/Output Summary (Last 24 hours) at 12/24/13 1339 Last data filed at 12/24/13 1300  Gross per 24 hour  Intake 1216.67 ml  Output   3500 ml  Net -2283.33 ml     Exam: General: Alert, extremely confused will attempt to answer questions. No respiratory distress Lungs: Clear to auscultation bilaterally without wheezes or crackles Cardiovascular: Regular rate and rhythm without murmur gallop or rub normal S1 and S2 Abdomen: Nontender, nondistended, soft, bowel sounds positive, no rebound, no ascites, no appreciable mass Extremities: No significant cyanosis, clubbing, or edema bilateral lower extremities, negative FABER left hip  Data Reviewed: Basic Metabolic Panel:  Recent Labs Lab 12/23/13 1031 12/23/13 1500 12/24/13 0358  NA 144  --  143  K 3.2*  --  3.2*  CL  --   --  95*  CO2 35*  --  37*  GLUCOSE 120  --  147*  BUN 28.3*  --  31*  CREATININE 1.1  --  1.18*  CALCIUM 16.4 Repeated and Verified* >15.0* >15.0*  MG  --  1.6  --    Liver Function Tests:  Recent Labs Lab 12/23/13 1031  AST 23  ALT 9  ALKPHOS 97  BILITOT 0.76  PROT 6.9  ALBUMIN 3.4*    No results found for this basename: LIPASE, AMYLASE,  in the last 168 hours No results found for this basename: AMMONIA,  in the last 168 hours CBC:  Recent Labs Lab 12/23/13 1032 12/24/13 0358  WBC 6.9 6.7  NEUTROABS 3.9  --   HGB 13.1 12.5  HCT 42.0 40.1  MCV 86.6 86.8  PLT 149 145*   Cardiac Enzymes: No results found for this basename: CKTOTAL, CKMB, CKMBINDEX, TROPONINI,  in the last 168 hours BNP (last 3 results) No results found for this basename: PROBNP,  in the last 8760 hours CBG: No results found for this basename: GLUCAP,  in the last 168 hours  Recent Results (from the past 240 hour(s))  TECHNOLOGIST REVIEW     Status: None   Collection Time    12/23/13 10:32 AM      Result Value Ref Range Status   Technologist Review Few Metas and Myelocytes present, variant lymphs    Final     Studies:  Recent x-ray studies have been reviewed in detail by the Attending Physician  Scheduled Meds:  Scheduled Meds: . acyclovir  400 mg  Oral Daily  . aspirin EC  81 mg Oral Daily  . enoxaparin (LOVENOX) injection  40 mg Subcutaneous Q24H  . methylPREDNISolone (SOLU-MEDROL) injection  60 mg Intravenous Daily  . sodium chloride  10-40 mL Intracatheter Q12H    Time spent on care of this patient: 40 mins   Allie Bossier , MD   [facility] Office  [phone] Pager [phone]  On-Call/Text Page:      [email or web address]      password [email or web address]  If 7PM-7AM, please contact night-coverage [email or web address] Password [email or web address] 12/24/2013, 1:39 PM   LOS: 1 day

## 2013-12-24 NOTE — Progress Notes (Signed)
LUAN URBANI   DOB:[birth year]   GG#:[id number]   VOJ#:[id number]  I have seen the patient, examined her and edited the notes as follows  Subjective: Left hip pain better controlled from prior day with current regimen with Ultram. No confusion. No respiratory or cardiac complaints. No abdominal pain. No nausea or vomiting. No fever or chills. Last bowel movement 6/14. Urinating without difficulty.    Scheduled Meds: . acyclovir  400 mg Oral Daily  . aspirin EC  81 mg Oral Daily  . enoxaparin (LOVENOX) injection  40 mg Subcutaneous Q24H  . methylPREDNISolone (SOLU-MEDROL) injection  60 mg Intravenous Daily  . sodium chloride  10-40 mL Intracatheter Q12H   Continuous Infusions: . sodium chloride 125 mL/hr at 12/23/13 2038   PRN Meds:.acetaminophen, acetaminophen, hydrALAZINE, ondansetron (ZOFRAN) IV, ondansetron, sodium chloride  Objective:  Filed Vitals:   12/24/13 0537  BP: 164/72  Pulse: 77  Temp: 97.6 F (36.4 C)  Resp: 16    Body mass index is 24.4 kg/(m^2).  Intake/Output Summary (Last 24 hours) at 12/24/13 0719 Last data filed at 12/24/13 0149  Gross per 24 hour  Intake 1156.67 ml  Output   2000 ml  Net -843.33 ml    ECOG PERFORMANCE STATUS:  2  Symptomatic, >50% in bed during the day (Ambulatory and capable of all self care but unable to carry out any work activities. Up and about more than 50% of waking hours)  GENERAL:alert, no distress and comfortable. She looks thin but not cachectic  SKIN: skin color, texture, turgor are normal, no rashes or significant lesions. She had extensive bruises  EYES: normal, Conjunctiva are pink and non-injected, sclera clear  OROPHARYNX:no exudate, no erythema and lips, buccal mucosa, and tongue normal  NECK: supple, thyroid normal size, non-tender, without nodularity  LYMPH: no palpable lymphadenopathy in the cervical, axillary or inguinal  LUNGS: clear to auscultation and percussion with normal breathing effort  HEART: regular  rate & rhythm and no murmurs, occasional ectopic beat  and no lower extremity edema  ABDOMEN:abdomen soft, non-tender and normal bowel sounds  Musculoskeletal:no cyanosis of digits and no clubbing  NEURO: alert & oriented x 3 with fluent speech, no focal motor/sensory deficits   CBG (last 3)  No results found for this basename: GLUCAP,  in the last 72 hours   Labs:   Recent Labs Lab 12/23/13 1032 12/24/13 0358  WBC 6.9 6.7  HGB 13.1 12.5  HCT 42.0 40.1  PLT 149 145*  MCV 86.6 86.8  MCH 27.0 27.1  MCHC 31.2* 31.2  RDW 16.6* 16.4*  LYMPHSABS 2.1  --   MONOABS 0.8  --   EOSABS 0.0  --   BASOSABS 0.1  --      Chemistries:    Recent Labs Lab 12/23/13 1031 12/23/13 1500 12/24/13 0358  NA 144  --  143  K 3.2*  --  3.2*  CL  --   --  95*  CO2 35*  --  37*  GLUCOSE 120  --  147*  BUN 28.3*  --  31*  CREATININE 1.1  --  1.18*  CALCIUM 16.4 Repeated and Verified*  --  >15.0*  MG  --  1.6  --   AST 23  --   --   ALT 9  --   --   ALKPHOS 97  --   --   BILITOT 0.76  --   --     GFR Estimated Creatinine Clearance: 33.9 ml/min (by C-G formula  based on Cr of 1.18).  Liver Function Tests:  Recent Labs Lab 12/23/13 1031  AST 23  ALT 9  ALKPHOS 97  BILITOT 0.76  PROT 6.9  ALBUMIN 3.4*     Recent Results (from the past 240 hour(s))  TECHNOLOGIST REVIEW     Status: None   Collection Time    12/23/13 10:32 AM      Result Value Ref Range Status   Technologist Review Few Metas and Myelocytes present, variant lymphs    Final     Imaging Studies: I have reviewed all the imaging studies myself. Dg Hip Complete Left  12/23/2013   CLINICAL DATA:  Left hip pain  EXAM: LEFT HIP - COMPLETE 2+ VIEW  COMPARISON:  None.  FINDINGS: The pelvic ring is intact. No acute fracture or dislocation is seen. No gross soft tissue abnormality is noted. Mild osteophytic changes are noted from the acetabulum.  IMPRESSION: Degenerative changes without acute abnormality.   Electronically  Signed   By: Inez Catalina M.D.   On: 12/23/2013 17:00   Ct Chest W Contrast  12/23/2013   COMPARISON:  CT of the chest, abdomen and pelvis performed 06/10/2013  FINDINGS: CT CHEST FINDINGS  There has been marked interval improvement in the patient's malignancy. The previously noted large left supraclavicular and superior mediastinal mass has resolved, with a few small residual lymph nodes seen in this region. Mediastinal lymphadenopathy has also resolved, and epicardial fat pad nodes are now normal in size.  The previously noted left-sided pleural effusion has also resolved. The lungs are essentially clear bilaterally. Focal scarring is noted at the right lung apex. No pneumothorax is seen. No masses are identified within the lung fields.  The mediastinum is unremarkable in appearance. No pericardial effusion is identified. The great vessels are grossly unremarkable in appearance. A tiny hiatal hernia seen.  The visualized portions of the thyroid gland are unremarkable in appearance. A right-sided chest port is noted ending about the cavoatrial junction. No axillary lymphadenopathy is seen.  No acute osseous abnormalities are identified. Vague sclerotic changes within the thoracic spine are new from the prior study and may reflect sequelae of treated disease.   IMPRESSION: 1. Overall significant improvement from the prior study. Severe retroperitoneal and mesenteric lymphadenopathy has significantly improved, with resolution of associated free fluid and improvement in soft tissue inflammation. Hepatic lesions are much less apparent. Supraclavicular and superior mediastinal mass has resolved, with resolution of mediastinal and epicardial fat pad lymphadenopathy. 2. New vague sclerotic changes within the thoracic spine may reflect sequelae of recently treated disease. 3. Left-sided pleural effusion has also resolved. Focal scarring noted at the right lung apex. 4. Tiny hiatal hernia seen. 5. Cholelithiasis noted;  gallbladder otherwise unremarkable in appearance. Minimal nonspecific wall thickening along the fundus of the gallbladder, without a focal mass. 6. Small left renal cyst again seen.   Electronically Signed   By: Garald Balding M.D.   On: 12/23/2013 22:59   Ct Abdomen Pelvis W Contrast  12/23/2013   COMPARISON:  CT of the chest, abdomen and pelvis performed 06/10/2013    Previously noted hepatic lesions are now less apparent, with a residual vague 1.7 cm hypodensity at the left hepatic lobe. Previously noted severe mesenteric and retroperitoneal lymphadenopathy has also significantly improved, with the largest mass now measuring 4.1 x 2.8 cm, decreased from 7.9 x 6.5 cm on the prior study. Associated soft tissue inflammation and free fluid have significantly improved.  The liver is otherwise grossly  unremarkable. The spleen is within normal limits. Small stones are again noted layering dependently within the gallbladder; previously noted associated inflammation has resolved. There is minimal nonspecific wall thickening at the gallbladder fundus, without a definite mass.  The pancreas and adrenal glands are unremarkable in appearance.  Minimal nonspecific perinephric stranding is noted bilaterally, improved from the prior study. There is no evidence of hydronephrosis. No renal or ureteral stones are seen. A small parenchymal calcification is noted at the interpole region of the left kidney. A 1.5 cm left renal cyst is better characterized than on the prior study.  The stomach is otherwise unremarkable in appearance. No significant free fluid is seen within the abdomen or pelvis. No significant small bowel abnormalities are seen. Minimal calcification is seen along the abdominal aorta and its branches. No acute vascular abnormalities are seen.  Residual mesenteric and retroperitoneal lymphadenopathy is seen. Periportal and peripancreatic nodes measure up to 1.4 cm in short axis, while periaortic nodes measure up to  1.1 cm in short axis. As described above, there is a 4.1 x 2.8 cm node at the right lower quadrant, with additional surrounding lymphadenopathy; this has significantly improved from the prior study. Mild associated soft tissue inflammation is seen, also improved.  The patient is status post appendectomy. The appendiceal stump is grossly unremarkable in appearance. The colon is unremarkable in appearance.  The bladder is mildly distended and grossly unremarkable in appearance. The patient is status post hysterectomy. No suspicious adnexal masses are seen. No inguinal lymphadenopathy is appreciated.  No acute osseous abnormalities are identified.  IMPRESSION: 1. Overall significant improvement from the prior study. Severe retroperitoneal and mesenteric lymphadenopathy has significantly improved, with resolution of associated free fluid and improvement in soft tissue inflammation. Hepatic lesions are much less apparent. Supraclavicular and superior mediastinal mass has resolved, with resolution of mediastinal and epicardial fat pad lymphadenopathy. 2. New vague sclerotic changes within the thoracic spine may reflect sequelae of recently treated disease. 3. Left-sided pleural effusion has also resolved. Focal scarring noted at the right lung apex. 4. Tiny hiatal hernia seen. 5. Cholelithiasis noted; gallbladder otherwise unremarkable in appearance. Minimal nonspecific wall thickening along the fundus of the gallbladder, without a focal mass. 6. Small left renal cyst again seen.   Electronically Signed   By: Garald Balding M.D.   On: 12/23/2013 22:59    Assessment/Plan: 80 y.o.  Hypercalcemia  This was suspected to be secondary to disease recurrence.  Even though CT scan show disease improvement, I suspect she has resistant cells which is causing the hypercalcemia. Her calcium level was checked twice and confirmed to be high.  She was admitted on 6/16 by the Hospitalist service. Plans of admission are to bring  her calcium level down with IV Solu-Medrol, IV fluids and Lasix. IV pamidronate was initiated as well. Current levels are >15 from 16.4 on 6/15.  Findings will be discussed with patient and family regarding prognosis. If treatment would be needed, the only treatment options left would be ICE chemotherapy, which I doubt she can tolerate. Another possibility would be Obinutuzumab, with response rates likely less than 20%.. The patient is not a candidate for bone marrow transplant. Another option would be hospice. The patient is distraught and unable to give me an answer. Plan to come back again this evening to discuss options with her.  CLL (chronic lymphocytic leukemia)  Her chemotherapy is discontinued.  She was doing very well on Ibrutinib but now she has become resistant  Hypokalemia Secondary  to diuretic use. Replenish  Hip pain  Likely due to malignant hypercalcemia.  CT scan shows no acute osseous abnormalities except  new vague sclerotic changes within the thoracic spine that may reflect sequelae of recently treated disease. Continue current pain management  Acute renal Failure This is due to hypercalcemia. Continue IV fluids.  DVT Prophylaxis On Lovenox  Full Code I will discuss with her and family further about possibility of changing her CODE STATUS to DO NOT INTUBATE due to poor prognosis. Apparently, she has advanced directives and living will drawn from prior visit.  Other medical issues as per admitting team   **Disclaimer: This note was dictated with voice recognition software. Similar sounding words can inadvertently be transcribed and this note may contain transcription errors which may not have been corrected upon publication of note.Sharene Butters E, PA-C 12/24/2013  7:19 AM GORSUCH, NI, MD 12/24/2013

## 2013-12-25 DIAGNOSIS — F29 Unspecified psychosis not due to a substance or known physiological condition: Secondary | ICD-10-CM

## 2013-12-25 DIAGNOSIS — Z515 Encounter for palliative care: Secondary | ICD-10-CM

## 2013-12-25 DIAGNOSIS — G9340 Encephalopathy, unspecified: Secondary | ICD-10-CM | POA: Diagnosis not present

## 2013-12-25 DIAGNOSIS — R5381 Other malaise: Secondary | ICD-10-CM

## 2013-12-25 DIAGNOSIS — R531 Weakness: Secondary | ICD-10-CM | POA: Diagnosis present

## 2013-12-25 DIAGNOSIS — R5383 Other fatigue: Secondary | ICD-10-CM

## 2013-12-25 LAB — BASIC METABOLIC PANEL
BUN: 41 mg/dL — AB (ref 6–23)
BUN: 48 mg/dL — ABNORMAL HIGH (ref 6–23)
CHLORIDE: 97 meq/L (ref 96–112)
CHLORIDE: 98 meq/L (ref 96–112)
CO2: 35 mEq/L — ABNORMAL HIGH (ref 19–32)
CO2: 33 meq/L — AB (ref 19–32)
Calcium: >15 mg/dL (ref 8.4–10.5)
Calcium: 13.6 mg/dL (ref 8.4–10.5)
Creatinine, Ser: 1.45 mg/dL — ABNORMAL HIGH (ref 0.50–1.10)
Creatinine, Ser: 1.4 mg/dL — ABNORMAL HIGH (ref 0.50–1.10)
GFR calc Af Amer: 41 mL/min — ABNORMAL LOW (ref >90)
GFR calc non Af Amer: 34 mL/min — ABNORMAL LOW (ref >90)
GFR calc non Af Amer: 35 mL/min — ABNORMAL LOW (ref >90)
GFR, EST AFRICAN AMERICAN: 39 mL/min — AB (ref >90)
GLUCOSE: 122 mg/dL — AB (ref 70–99)
Glucose, Bld: 127 mg/dL — ABNORMAL HIGH (ref 70–99)
POTASSIUM: 3.1 meq/L — AB (ref 3.7–5.3)
POTASSIUM: 2.9 meq/L — AB (ref 3.7–5.3)
SODIUM: 143 meq/L (ref 137–147)
Sodium: 141 mEq/L (ref 137–147)

## 2013-12-25 LAB — CBC WITH DIFFERENTIAL/PLATELET
BASOS ABS: 0.1 10*3/uL (ref 0.0–0.1)
Basophils Relative: 1 % (ref 0–1)
EOS PCT: 0 % (ref 0–5)
Eosinophils Absolute: 0 10*3/uL (ref 0.0–0.7)
HCT: 38.8 % (ref 36.0–46.0)
Hemoglobin: 12.2 g/dL (ref 12.0–15.0)
Lymphocytes Relative: 26 % (ref 12–46)
Lymphs Abs: 1.8 10*3/uL (ref 0.7–4.0)
MCH: 27 pg (ref 26.0–34.0)
MCHC: 31.4 g/dL (ref 30.0–36.0)
MCV: 85.8 fL (ref 78.0–100.0)
MONOS PCT: 9 % (ref 3–12)
Monocytes Absolute: 0.6 10*3/uL (ref 0.1–1.0)
NEUTROS PCT: 64 % (ref 43–77)
Neutro Abs: 4.5 10*3/uL (ref 1.7–7.7)
PLATELETS: 130 10*3/uL — AB (ref 150–400)
RBC: 4.52 MIL/uL (ref 3.87–5.11)
RDW: 16.5 % — ABNORMAL HIGH (ref 11.5–15.5)
WBC: 7 10*3/uL (ref 4.0–10.5)

## 2013-12-25 LAB — COMPREHENSIVE METABOLIC PANEL
ALBUMIN: 2.9 g/dL — AB (ref 3.5–5.2)
ALT: 8 U/L (ref 0–35)
AST: 27 U/L (ref 0–37)
Alkaline Phosphatase: 78 U/L (ref 39–117)
BILIRUBIN TOTAL: 0.6 mg/dL (ref 0.3–1.2)
BUN: 43 mg/dL — ABNORMAL HIGH (ref 6–23)
CALCIUM: 14.6 mg/dL — AB (ref 8.4–10.5)
CHLORIDE: 97 meq/L (ref 96–112)
CO2: 34 mEq/L — ABNORMAL HIGH (ref 19–32)
Creatinine, Ser: 1.42 mg/dL — ABNORMAL HIGH (ref 0.50–1.10)
GFR calc Af Amer: 40 mL/min — ABNORMAL LOW (ref >90)
GFR calc non Af Amer: 34 mL/min — ABNORMAL LOW (ref >90)
Glucose, Bld: 126 mg/dL — ABNORMAL HIGH (ref 70–99)
Potassium: 3.3 mEq/L — ABNORMAL LOW (ref 3.7–5.3)
Sodium: 140 mEq/L (ref 137–147)
Total Protein: 5.8 g/dL — ABNORMAL LOW (ref 6.0–8.3)

## 2013-12-25 LAB — MAGNESIUM
Magnesium: 1.3 mg/dL — ABNORMAL LOW (ref 1.5–2.5)
Magnesium: 2.1 mg/dL (ref 1.5–2.5)

## 2013-12-25 MED ORDER — POTASSIUM CHLORIDE CRYS ER 20 MEQ PO TBCR
40.0000 meq | EXTENDED_RELEASE_TABLET | ORAL | Status: DC
  Filled 2013-12-25 (×2): qty 2

## 2013-12-25 MED ORDER — MORPHINE SULFATE 2 MG/ML IJ SOLN: 1.0000 mg | INTRAMUSCULAR | Status: DC | PRN

## 2013-12-25 MED ORDER — POTASSIUM CHLORIDE CRYS ER 20 MEQ PO TBCR
30.0000 meq | EXTENDED_RELEASE_TABLET | Freq: Once | ORAL | Status: AC
  Administered 2013-12-25: 30 meq via ORAL
  Filled 2013-12-25: qty 1

## 2013-12-25 MED ORDER — ENOXAPARIN SODIUM 30 MG/0.3ML ~~LOC~~ SOLN
30.0000 mg | SUBCUTANEOUS | Status: DC
  Administered 2013-12-25: 30 mg via SUBCUTANEOUS
  Filled 2013-12-25: qty 0.3

## 2013-12-25 MED ORDER — MAGNESIUM SULFATE 40 MG/ML IJ SOLN
2.0000 g | Freq: Once | INTRAMUSCULAR | Status: AC
  Administered 2013-12-25: 2 g via INTRAVENOUS
  Filled 2013-12-25: qty 50

## 2013-12-25 MED ORDER — BISACODYL 10 MG RE SUPP
10.0000 mg | Freq: Every day | RECTAL | Status: DC | PRN
Start: 2013-12-25 — End: 2013-12-27

## 2013-12-25 MED ORDER — LORAZEPAM 2 MG/ML IJ SOLN
1.0000 mg | INTRAMUSCULAR | Status: DC | PRN
  Administered 2013-12-26: 1 mg via INTRAVENOUS
  Filled 2013-12-25 (×2): qty 1

## 2013-12-25 NOTE — Progress Notes (Signed)
Noted palliative care service consult note. Will sign off. Call if questions arise. Saim Almanza, MD

## 2013-12-25 NOTE — Progress Notes (Signed)
CRITICAL VALUE ALERT  Critical value received:  Calcium 14.6  Date of notification:  12/25/2013   Time of notification:  0415  Critical value read back:yes  Nurse who received alert: Carolyn Stare   MD notified (1st page):  Tylene Fantasia NP  Time of first page:  0425  MD notified (2nd page): Tylene Fantasia NP  Time of second page: [phone]  Responding MD:    Time MD responded:      MD aware no new orders received will continue to monitor patient.

## 2013-12-25 NOTE — Progress Notes (Signed)
CRITICAL VALUE ALERT  Critical value received:  Ca13.6,K2.9  Date of notification:  12/25/13  Time of notification:  2449 Critical value read back:no  Nurse who received alert:  Westly Pam  MD notified (1st page):  Dr. Clementeen Graham  Time of first page:  [phone]  MD notified (2nd page):  Time of second page:[phone]  Responding MD: Dr. Clementeen Graham  Time MD responded: [phone]

## 2013-12-25 NOTE — Consult Note (Signed)
Patient Margaret Mann:WNUUVO B Ardolino      DOB: [birth year]      ZDG:[id number]     Consult Note from the Palliative Medicine Team at [facility] Requested by: Dr Alvy Bimler     PCP: Osborne Casco, MD Reason for Consultation:Clarification of [place] and options     Phone Number:[phone]  Assessment of patients Current state: Rapid decline over the past several days secondary to CLL, hypercalcemia, ARF.  Faced with advanced directive decisions and anticipatory care needs.   Consult is for review of medical treatment options, clarification of goals of care and end of life issues, disposition and options, and symptom recommendation.  This NP Wadie Lessen reviewed medical records, received report from team, assessed the patient and then meet at the patient's bedside along with her brother in [place] # [phone] and by telephone with Vickie Patille/sister in law  to discuss diagnosis prognosis, [place], EOL wishes disposition and options.  A detailed discussion was had today regarding advanced directives.  Concepts specific to code status, artifical feeding and hydration, continued IV antibiotics and rehospitalization was had.  The difference between a aggressive medical intervention path  and a palliative comfort care path for this patient at this time was had.  Values and goals of care important to patient and family were attempted to be elicited.  Concept of Hospice and Palliative Care were discussed  Natural trajectory and expectations at EOL were discussed.  Questions and concerns addressed.  Hard Choices booklet left for review. Family encouraged to call with questions or concerns.  PMT will continue to support holistically.   Goals of Care: 1.  Code Status: DNR/DNI-comfort is main focus of care   2. Scope of Treatment: 1. Vital Signs:daily  2. Respiratory/Oxygen:as needed for comfort 3. Nutritional Support/Tube Feeds: no artifical feeding now or in the  future 4. Antibiotics:none 5. IVF: none 6. Review of Medications to be discontinued:minimize for comfort 7. Labs:none 8. Telemetry:none   3. Disposition:  Begin to de-escalate curative/life prolonging interventions and focus on comfort quality and dignity.  Family is processing situation and are considering home with hospice vs inpateitn hospice facility  4. Symptom Management:   1. Anxiety/Agitation:  Ativan 1 mg IV every 4 hrs prn 2. Pain/Dyspnea: Morphine 1 mg IV every 1 hr prn 3. Bowel Regimen:  Dulcolax supp prn 4. Urinary incontinence- place foley cath for comfort  5. Psychosocial:  Emotional support to family at bedside. Hope is for comfort and dignity, all understand the limited prognosis  6. Spiritual:  Strong community church support    Brief FIE:PPIRJJ B Saling is a 80 y.o. female with PMH CLL, prior history of hypercalcemia, uterine cancer, Anemia, CHF diastolic dysfunction, Breast cancer, Who presents for routine follow up to Dr Alvy Bimler office found to have calcium level at 16.  Per family patient had been experiencing  left  hip pain several days prior to admission.  Admitted for stabilization but has continued to decline, limited treatment options for CLL with poor response rates.  Family has made decision for comfort at this time.     ROS:  Unable to illicit due to altered mental status   PMH:  Past Medical History  Diagnosis Date  . Hypertension   . Hyperlipemia   . RBBB   . Lymphocytic leukemia   . Breast cancer     rt lump-08,lt lump 14  . Arthritis   . Tuberculosis     as a teen-was treated  .  CHF (congestive heart failure)     hx 04-cardiac work up 2010-no signs chf  . HOH (hard of hearing)   . Anemia   . Osteopenia      PSH: Past Surgical History  Procedure Laterality Date  . Biopsy breast  2008    rt  . Endometrial biopsy  2010  . Appendectomy    . Ganglion cyst excision  2011    left hand  . Diagnostic laparoscopy      node bx   . Abdominal hysterectomy  2010    BSO plus node bx  . Eye surgery      both cataracts  . Breast surgery  2008    rt lump-snbx  . Breast lumpectomy with needle localization Left 04/09/2013    Procedure: BREAST LUMPECTOMY WITH NEEDLE LOCALIZATION;  Surgeon: Edward Jolly, MD;  Location: [facility];  Service: General;  Laterality: Left;   I have reviewed the [place] and SH and  If appropriate update it with new information. Allergies  Allergen Reactions  . Dilaudid [Hydromorphone Hcl] Nausea And Vomiting   Scheduled Meds: . acyclovir  400 mg Oral Daily  . aspirin EC  81 mg Oral Daily  . enoxaparin (LOVENOX) injection  30 mg Subcutaneous Q24H  . furosemide  20 mg Intravenous 4 times per day  . methylPREDNISolone (SOLU-MEDROL) injection  125 mg Intravenous Q24H  . potassium chloride  40 mEq Oral BID  . potassium chloride  40 mEq Oral Q4H  . sodium chloride  10-40 mL Intracatheter Q12H   Continuous Infusions: . sodium chloride 200 mL/hr (12/25/13 1458)   PRN Meds:.acetaminophen, acetaminophen, hydrALAZINE, ondansetron (ZOFRAN) IV, ondansetron, sodium chloride    BP 154/76  Pulse 75  Temp(Src) 97.8 F (36.6 C) (Oral)  Resp 18  Ht 5\' 4"  (1.626 m)  Wt 65 kg (143 lb 4.8 oz)  BMI 24.59 kg/m2  SpO2 94%   PPS:  30 % at best   Intake/Output Summary (Last 24 hours) at 12/25/13 1527 Last data filed at 12/25/13 1430  Gross per 24 hour  Intake 3211.25 ml  Output   2450 ml  Net 761.25 ml    Physical Exam:  General: ill appearing, confused, NAD HEENT:  Dry buccal membranes, no exudate Chest:  Decreased in bases, shallow breathing pattern CVS: RRR Abdomen: soft NT +BS Ext: without edema Skin: warm and dry Neuro: disoriented, unable to follow simple commands presetnly  Labs: CBC    Component Value Date/Time   WBC 7.0 12/25/2013 0320   WBC 6.9 12/23/2013 1032   RBC 4.52 12/25/2013 0320   RBC 4.85 12/23/2013 1032   HGB 12.2 12/25/2013 0320   HGB 13.1  12/23/2013 1032   HCT 38.8 12/25/2013 0320   HCT 42.0 12/23/2013 1032   PLT 130* 12/25/2013 0320   PLT 149 12/23/2013 1032   MCV 85.8 12/25/2013 0320   MCV 86.6 12/23/2013 1032   MCH 27.0 12/25/2013 0320   MCH 27.0 12/23/2013 1032   MCHC 31.4 12/25/2013 0320   MCHC 31.2* 12/23/2013 1032   RDW 16.5* 12/25/2013 0320   RDW 16.6* 12/23/2013 1032   LYMPHSABS 1.8 12/25/2013 0320   LYMPHSABS 2.1 12/23/2013 1032   MONOABS 0.6 12/25/2013 0320   MONOABS 0.8 12/23/2013 1032   EOSABS 0.0 12/25/2013 0320   EOSABS 0.0 12/23/2013 1032   BASOSABS 0.1 12/25/2013 0320   BASOSABS 0.1 12/23/2013 1032    BMET    Component Value Date/Time   NA 143 12/25/2013 1020  NA 144 12/23/2013 1031   K 2.9* 12/25/2013 1020   K 3.2* 12/23/2013 1031   CL 98 12/25/2013 1020   CL 101 11/05/2012 0923   CO2 33* 12/25/2013 1020   CO2 35* 12/23/2013 1031   GLUCOSE 127* 12/25/2013 1020   GLUCOSE 120 12/23/2013 1031   GLUCOSE 93 11/05/2012 0923   BUN 48* 12/25/2013 1020   BUN 28.3* 12/23/2013 1031   CREATININE 1.40* 12/25/2013 1020   CREATININE 1.1 12/23/2013 1031   CALCIUM 13.6* 12/25/2013 1020   CALCIUM >15.0* 12/23/2013 1500   CALCIUM 16.4 Repeated and Verified* 12/23/2013 1031   GFRNONAA 35* 12/25/2013 1020   GFRAA 41* 12/25/2013 1020    CMP     Component Value Date/Time   NA 143 12/25/2013 1020   NA 144 12/23/2013 1031   K 2.9* 12/25/2013 1020   K 3.2* 12/23/2013 1031   CL 98 12/25/2013 1020   CL 101 11/05/2012 0923   CO2 33* 12/25/2013 1020   CO2 35* 12/23/2013 1031   GLUCOSE 127* 12/25/2013 1020   GLUCOSE 120 12/23/2013 1031   GLUCOSE 93 11/05/2012 0923   BUN 48* 12/25/2013 1020   BUN 28.3* 12/23/2013 1031   CREATININE 1.40* 12/25/2013 1020   CREATININE 1.1 12/23/2013 1031   CALCIUM 13.6* 12/25/2013 1020   CALCIUM >15.0* 12/23/2013 1500   CALCIUM 16.4 Repeated and Verified* 12/23/2013 1031   PROT 5.8* 12/25/2013 0320   PROT 6.9 12/23/2013 1031   ALBUMIN 2.9* 12/25/2013 0320   ALBUMIN 3.4* 12/23/2013 1031   AST 27 12/25/2013 0320   AST 23  12/23/2013 1031   ALT 8 12/25/2013 0320   ALT 9 12/23/2013 1031   ALKPHOS 78 12/25/2013 0320   ALKPHOS 97 12/23/2013 1031   BILITOT 0.6 12/25/2013 0320   BILITOT 0.76 12/23/2013 1031   GFRNONAA 35* 12/25/2013 1020   GFRAA 41* 12/25/2013 1020     Time In Time Out Total Time Spent with Patient Total Overall Time  1500 1415 70 min 75 min    Greater than 50%  of this time was spent counseling and coordinating care related to the above assessment and plan.   Wadie Lessen NP  Palliative Medicine Team Team Phone # [phone] Pager [phone]  Discussed with Dr Clementeen Graham

## 2013-12-25 NOTE — Progress Notes (Signed)
[facility] PROGRESS NOTE  Margaret Mann QJJ:[id number] DOB: [birth year] DOA: 12/23/2013 PCP: Osborne Casco, MD  Brief narrative  80 y.o. female with past medical history of breast cancer, uterine cancer, CLL, prior history of hypercalcemia, Anemia, CHF diastolic dysfunction. Who presents for routine follow up to Dr Alvy Bimler office found to have calcium level at 16.   Assessment/Plan: Hypercalcemia Likely secondary to disease recurrence. Calcium level slowly improving with IV fluids, dilated thick and IV Solu-Medrol. [name] Patient seen by oncology consult and recommended that he has very limited treatment options. One of them being ICE chemotherapy which oncologist doubts she can tolerate.Another possibility would be Obinutuzumab, with response rates likely less than 20%.. The patient is not a candidate for bone marrow transplant. -After discussion with patient and the family she was made DO NOT RESUSCITATE with plan on palliative care consult. Palliative care team discussed with the family and are inclined on comfort measures. -Added when necessary morphine for comfort , when necessary Ativan for anxiety  Hypokalemia Secondary to diuretic. replenished. Magnesium replenished as well  Acute renal failure Secondary to hypercalcemia. Continue fluids.  Acute encephalopathy Likely secondary to dehydration and hypocalcemia.  Hip pain Likely secondary to malignant hypercalcemia. CT scan shows no acute osseous abnormalities. Continue pain medications  Diet: Regular  Code Status: DO NOT RESUSCITATE Family Communication: Discussed with brother-in-law and niece at bedside  Disposition Plan: Plan on home versus residential  hospice pending family discussion   Consultants:  Oncology  Palliative care  Procedures:  None  Antibiotics:  None  HPI/Subjective: This was seen and examined this morning. Noted to be  confused  Objective: Filed Vitals:   12/25/13 1340  BP: 154/76  Pulse: 75  Temp: 97.8 F (36.6 C)  Resp: 18    Intake/Output Summary (Last 24 hours) at 12/25/13 1904 Last data filed at 12/25/13 1758  Gross per 24 hour  Intake   2570 ml  Output   2650 ml  Net    -80 ml   Filed Weights   12/23/13 2056 12/24/13 0537 12/25/13 0412  Weight: 64.7 kg (142 lb 10.2 oz) 64.5 kg (142 lb 3.2 oz) 65 kg (143 lb 4.8 oz)    Exam:   General:  Elderly female lying in bed appears fatigued  HEENT: No pallor, dry oral mucosa  Chest: Clear to auscultation bilaterally,   CVS: Normal S1-S2, no murmurs rub or gallop  Abdomen: Soft, nontender, nondistended, bowel sounds present  Extremities: Warm, no edema  CNS: AAO x2, confused periodically  Data Reviewed: Basic Metabolic Panel:  Recent Labs Lab 12/23/13 1031 12/23/13 1500 12/24/13 0358 12/24/13 1854 12/25/13 0320 12/25/13 1020  NA 144  --  143 141 140 143  K 3.2*  --  3.2* 3.1* 3.3* 2.9*  CL  --   --  95* 97 97 98  CO2 35*  --  37* 35* 34* 33*  GLUCOSE 120  --  147* 122* 126* 127*  BUN 28.3*  --  31* 41* 43* 48*  CREATININE 1.1  --  1.18* 1.45* 1.42* 1.40*  CALCIUM 16.4 Repeated and Verified* >15.0* >15.0* >15.0* 14.6* 13.6*  MG  --  1.6  --  1.5 1.3* 2.1   Liver Function Tests:  Recent Labs Lab 12/23/13 1031 12/25/13 0320  AST 23 27  ALT 9 8  ALKPHOS 97 78  BILITOT 0.76 0.6  PROT 6.9 5.8*  ALBUMIN 3.4* 2.9*   No results found for this basename:  LIPASE, AMYLASE,  in the last 168 hours No results found for this basename: AMMONIA,  in the last 168 hours CBC:  Recent Labs Lab 12/23/13 1032 12/24/13 0358 12/25/13 0320  WBC 6.9 6.7 7.0  NEUTROABS 3.9  --  4.5  HGB 13.1 12.5 12.2  HCT 42.0 40.1 38.8  MCV 86.6 86.8 85.8  PLT 149 145* 130*   Cardiac Enzymes: No results found for this basename: CKTOTAL, CKMB, CKMBINDEX, TROPONINI,  in the last 168 hours BNP (last 3 results) No results found for this  basename: PROBNP,  in the last 8760 hours CBG: No results found for this basename: GLUCAP,  in the last 168 hours  Recent Results (from the past 240 hour(s))  TECHNOLOGIST REVIEW     Status: None   Collection Time    12/23/13 10:32 AM      Result Value Ref Range Status   Technologist Review Few Metas and Myelocytes present, variant lymphs    Final     Studies: Ct Chest W Contrast  12/23/2013   CLINICAL DATA:  History of chronic lymphocytic leukemia. Hypercalcemia. Fatigue. Left hip pain and confusion.  EXAM: CT CHEST, ABDOMEN, AND PELVIS WITH CONTRAST  TECHNIQUE: Multidetector CT imaging of the chest, abdomen and pelvis was performed following the standard protocol during bolus administration of intravenous contrast.  CONTRAST:  151mL OMNIPAQUE IOHEXOL 300 MG/ML SOLN, 74mL OMNIPAQUE IOHEXOL 300 MG/ML SOLN  COMPARISON:  CT of the chest, abdomen and pelvis performed 06/10/2014  FINDINGS: CT CHEST FINDINGS  There has been marked interval improvement in the patient's malignancy. The previously noted large left supraclavicular and superior mediastinal mass has resolved, with a few small residual lymph nodes seen in this region. Mediastinal lymphadenopathy has also resolved, and epicardial fat pad nodes are now normal in size.  The previously noted left-sided pleural effusion has also resolved. The lungs are essentially clear bilaterally. Focal scarring is noted at the right lung apex. No pneumothorax is seen. No masses are identified within the lung fields.  The mediastinum is unremarkable in appearance. No pericardial effusion is identified. The great vessels are grossly unremarkable in appearance. A tiny hiatal hernia seen.  The visualized portions of the thyroid gland are unremarkable in appearance. A right-sided chest port is noted ending about the cavoatrial junction. No axillary lymphadenopathy is seen.  No acute osseous abnormalities are identified. Vague sclerotic changes within the thoracic spine  are new from the prior study and may reflect sequelae of treated disease.  CT ABDOMEN AND PELVIS FINDINGS  Previously noted hepatic lesions are now less apparent, with a residual vague 1.7 cm hypodensity at the left hepatic lobe. Previously noted severe mesenteric and retroperitoneal lymphadenopathy has also significantly improved, with the largest mass now measuring 4.1 x 2.8 cm, decreased from 7.9 x 6.5 cm on the prior study. Associated soft tissue inflammation and free fluid have significantly improved.  The liver is otherwise grossly unremarkable. The spleen is within normal limits. Small stones are again noted layering dependently within the gallbladder; previously noted associated inflammation has resolved. There is minimal nonspecific wall thickening at the gallbladder fundus, without a definite mass.  The pancreas and adrenal glands are unremarkable in appearance.  Minimal nonspecific perinephric stranding is noted bilaterally, improved from the prior study. There is no evidence of hydronephrosis. No renal or ureteral stones are seen. A small parenchymal calcification is noted at the interpole region of the left kidney. A 1.5 cm left renal cyst is better characterized than on the  prior study.  The stomach is otherwise unremarkable in appearance. No significant free fluid is seen within the abdomen or pelvis. No significant small bowel abnormalities are seen. Minimal calcification is seen along the abdominal aorta and its branches. No acute vascular abnormalities are seen.  Residual mesenteric and retroperitoneal lymphadenopathy is seen. Periportal and peripancreatic nodes measure up to 1.4 cm in short axis, while periaortic nodes measure up to 1.1 cm in short axis. As described above, there is a 4.1 x 2.8 cm node at the right lower quadrant, with additional surrounding lymphadenopathy; this has significantly improved from the prior study. Mild associated soft tissue inflammation is seen, also improved.  The  patient is status post appendectomy. The appendiceal stump is grossly unremarkable in appearance. The colon is unremarkable in appearance.  The bladder is mildly distended and grossly unremarkable in appearance. The patient is status post hysterectomy. No suspicious adnexal masses are seen. No inguinal lymphadenopathy is appreciated.  No acute osseous abnormalities are identified.  IMPRESSION: 1. Overall significant improvement from the prior study. Severe retroperitoneal and mesenteric lymphadenopathy has significantly improved, with resolution of associated free fluid and improvement in soft tissue inflammation. Hepatic lesions are much less apparent. Supraclavicular and superior mediastinal mass has resolved, with resolution of mediastinal and epicardial fat pad lymphadenopathy. 2. New vague sclerotic changes within the thoracic spine may reflect sequelae of recently treated disease. 3. Left-sided pleural effusion has also resolved. Focal scarring noted at the right lung apex. 4. Tiny hiatal hernia seen. 5. Cholelithiasis noted; gallbladder otherwise unremarkable in appearance. Minimal nonspecific wall thickening along the fundus of the gallbladder, without a focal mass. 6. Small left renal cyst again seen.   Electronically Signed   By: Garald Balding M.D.   On: 12/23/2013 22:59   Ct Abdomen Pelvis W Contrast  12/23/2013   CLINICAL DATA:  History of chronic lymphocytic leukemia. Hypercalcemia. Fatigue. Left hip pain and confusion.  EXAM: CT CHEST, ABDOMEN, AND PELVIS WITH CONTRAST  TECHNIQUE: Multidetector CT imaging of the chest, abdomen and pelvis was performed following the standard protocol during bolus administration of intravenous contrast.  CONTRAST:  180mL OMNIPAQUE IOHEXOL 300 MG/ML SOLN, 26mL OMNIPAQUE IOHEXOL 300 MG/ML SOLN  COMPARISON:  CT of the chest, abdomen and pelvis performed 06/10/2014  FINDINGS: CT CHEST FINDINGS  There has been marked interval improvement in the patient's malignancy. The  previously noted large left supraclavicular and superior mediastinal mass has resolved, with a few small residual lymph nodes seen in this region. Mediastinal lymphadenopathy has also resolved, and epicardial fat pad nodes are now normal in size.  The previously noted left-sided pleural effusion has also resolved. The lungs are essentially clear bilaterally. Focal scarring is noted at the right lung apex. No pneumothorax is seen. No masses are identified within the lung fields.  The mediastinum is unremarkable in appearance. No pericardial effusion is identified. The great vessels are grossly unremarkable in appearance. A tiny hiatal hernia seen.  The visualized portions of the thyroid gland are unremarkable in appearance. A right-sided chest port is noted ending about the cavoatrial junction. No axillary lymphadenopathy is seen.  No acute osseous abnormalities are identified. Vague sclerotic changes within the thoracic spine are new from the prior study and may reflect sequelae of treated disease.  CT ABDOMEN AND PELVIS FINDINGS  Previously noted hepatic lesions are now less apparent, with a residual vague 1.7 cm hypodensity at the left hepatic lobe. Previously noted severe mesenteric and retroperitoneal lymphadenopathy has also significantly improved, with the  largest mass now measuring 4.1 x 2.8 cm, decreased from 7.9 x 6.5 cm on the prior study. Associated soft tissue inflammation and free fluid have significantly improved.  The liver is otherwise grossly unremarkable. The spleen is within normal limits. Small stones are again noted layering dependently within the gallbladder; previously noted associated inflammation has resolved. There is minimal nonspecific wall thickening at the gallbladder fundus, without a definite mass.  The pancreas and adrenal glands are unremarkable in appearance.  Minimal nonspecific perinephric stranding is noted bilaterally, improved from the prior study. There is no evidence of  hydronephrosis. No renal or ureteral stones are seen. A small parenchymal calcification is noted at the interpole region of the left kidney. A 1.5 cm left renal cyst is better characterized than on the prior study.  The stomach is otherwise unremarkable in appearance. No significant free fluid is seen within the abdomen or pelvis. No significant small bowel abnormalities are seen. Minimal calcification is seen along the abdominal aorta and its branches. No acute vascular abnormalities are seen.  Residual mesenteric and retroperitoneal lymphadenopathy is seen. Periportal and peripancreatic nodes measure up to 1.4 cm in short axis, while periaortic nodes measure up to 1.1 cm in short axis. As described above, there is a 4.1 x 2.8 cm node at the right lower quadrant, with additional surrounding lymphadenopathy; this has significantly improved from the prior study. Mild associated soft tissue inflammation is seen, also improved.  The patient is status post appendectomy. The appendiceal stump is grossly unremarkable in appearance. The colon is unremarkable in appearance.  The bladder is mildly distended and grossly unremarkable in appearance. The patient is status post hysterectomy. No suspicious adnexal masses are seen. No inguinal lymphadenopathy is appreciated.  No acute osseous abnormalities are identified.  IMPRESSION: 1. Overall significant improvement from the prior study. Severe retroperitoneal and mesenteric lymphadenopathy has significantly improved, with resolution of associated free fluid and improvement in soft tissue inflammation. Hepatic lesions are much less apparent. Supraclavicular and superior mediastinal mass has resolved, with resolution of mediastinal and epicardial fat pad lymphadenopathy. 2. New vague sclerotic changes within the thoracic spine may reflect sequelae of recently treated disease. 3. Left-sided pleural effusion has also resolved. Focal scarring noted at the right lung apex. 4. Tiny  hiatal hernia seen. 5. Cholelithiasis noted; gallbladder otherwise unremarkable in appearance. Minimal nonspecific wall thickening along the fundus of the gallbladder, without a focal mass. 6. Small left renal cyst again seen.   Electronically Signed   By: Garald Balding M.D.   On: 12/23/2013 22:59    Scheduled Meds: . furosemide  20 mg Intravenous 4 times per day   Continuous Infusions: . sodium chloride 200 mL/hr (12/25/13 1458)      Time spent: 25 minutes    [name], Palmer  [facility] Pager [phone]. If 7PM-7AM, please contact night-coverage at [email or web address], password [facility] 12/25/2013, 7:04 PM  LOS: 2 days

## 2013-12-25 NOTE — Progress Notes (Signed)
Margaret Mann   DOB:July 10, 1933   XT#:[id number]   GDJ#:[id number]  I have seen the patient, examined her and edited the notes as follows  Subjective:  Family member at bedside. Patient is more confused this morning. Answers provided by the family member.  Tries to answer questions but comprehension is decreased. No respiratory or cardiac complaints. No abdominal pain. No nausea or vomiting. No fever or chills. Last bowel movement 6/14. Urinating without difficulty but  very frequently. Palliative Care service is consulted as of 6/16.   Scheduled Meds: . acyclovir  400 mg Oral Daily  . aspirin EC  81 mg Oral Daily  . enoxaparin (LOVENOX) injection  40 mg Subcutaneous Q24H  . furosemide  20 mg Intravenous 4 times per day  . methylPREDNISolone (SOLU-MEDROL) injection  125 mg Intravenous Q24H  . potassium chloride  40 mEq Oral BID  . sodium chloride  10-40 mL Intracatheter Q12H   Continuous Infusions: . sodium chloride 200 mL/hr at 12/25/13 0348   PRN Meds:.acetaminophen, acetaminophen, hydrALAZINE, ondansetron (ZOFRAN) IV, ondansetron, sodium chloride  Objective:  Filed Vitals:   12/25/13 0412  BP: 155/84  Pulse: 72  Temp: 97.5 F (36.4 C)  Resp: 20    Body mass index is 24.59 kg/(m^2).  Intake/Output Summary (Last 24 hours) at 12/25/13 0803 Last data filed at 12/25/13 0700  Gross per 24 hour  Intake 5211.25 ml  Output   2600 ml  Net 2611.25 ml    ECOG PERFORMANCE STATUS:2-3    GENERAL: alert, no distress and comfortable. She looks thin but not cachectic  SKIN: skin color, texture, turgor are normal, no rashes or significant lesions. She had extensive bruises  EYES: normal, Conjunctiva are pink and non-injected, sclera clear  OROPHARYNX:no exudate, no erythema and lips, buccal mucosa, and tongue normal  NECK: supple, thyroid normal size, non-tender, without nodularity  LYMPH: no palpable lymphadenopathy in the cervical, axillary or inguinal  LUNGS: clear to auscultation  and percussion with normal breathing effort  HEART: regular rate & rhythm and no murmurs, occasional ectopic beat  and no lower extremity edema  ABDOMEN:abdomen soft, non-tender and normal bowel sounds  Musculoskeletal:no cyanosis of digits and no clubbing  NEURO: Does not know date, place or year. She follows simple commands.   CBG (last 3)  No results found for this basename: GLUCAP,  in the last 72 hours   Labs:   Recent Labs Lab 12/23/13 1032 12/24/13 0358 12/25/13 0320  WBC 6.9 6.7 7.0  HGB 13.1 12.5 12.2  HCT 42.0 40.1 38.8  PLT 149 145* 130*  MCV 86.6 86.8 85.8  MCH 27.0 27.1 27.0  MCHC 31.2* 31.2 31.4  RDW 16.6* 16.4* 16.5*  LYMPHSABS 2.1  --  1.8  MONOABS 0.8  --  0.6  EOSABS 0.0  --  0.0  BASOSABS 0.1  --  0.1     Chemistries:    Recent Labs Lab 12/23/13 1031 12/23/13 1500 12/24/13 0358 12/24/13 1854 12/25/13 0320  NA 144  --  143 141 140  K 3.2*  --  3.2* 3.1* 3.3*  CL  --   --  95* 97 97  CO2 35*  --  37* 35* 34*  GLUCOSE 120  --  147* 122* 126*  BUN 28.3*  --  31* 41* 43*  CREATININE 1.1  --  1.18* 1.45* 1.42*  CALCIUM 16.4 Repeated and Verified* >15.0* >15.0* 16.3* 14.6*  MG  --  1.6  --  1.5 1.3*  AST 23  --   --   --  27  ALT 9  --   --   --  8  ALKPHOS 97  --   --   --  78  BILITOT 0.76  --   --   --  0.6    GFR Estimated Creatinine Clearance: 28.2 ml/min (by C-G formula based on Cr of 1.42).  Liver Function Tests:  Recent Labs Lab 12/23/13 1031 12/25/13 0320  AST 23 27  ALT 9 8  ALKPHOS 97 78  BILITOT 0.76 0.6  PROT 6.9 5.8*  ALBUMIN 3.4* 2.9*     Assessment/Plan: 80 y.o.  Hypercalcemia  This was suspected to be secondary to disease recurrence.  Even though CT scan show disease improvement, I suspect she has resistant cells which is causing the hypercalcemia. Her calcium level was checked twice and confirmed to be high.  She was admitted on 6/16 by the Hospitalist service. Plans of admission are to bring her calcium  level down with IV Solu-Medrol, IV fluids and Lasix. IV pamidronate was initiated as well. Current levels are >15 from 16.4 on 6/15. Her Calcium is now at 14.6 Findings will be discussed with patient and family regarding prognosis. If treatment would be needed, the only treatment options left would be ICE chemotherapy, which I doubt she can tolerate. Another possibility would be Obinutuzumab, with response rates likely less than 20%.. The patient is not a candidate for bone marrow transplant. After much discussion with her brother who is the medical power of attorney, he has made informed decision to pursue further chemotherapy.  As of 6/16 she is under Palliative Care  Confusion May be secondary to hypercalcemia of malignancy. She is afebrile and last urinallysis was negative for nitrities as of 6/15  CLL (chronic lymphocytic leukemia)  Her chemotherapy is discontinued.  She was doing very well on Ibrutinib but now she has become resistant. Chemotherapy is discontinued  Hypokalemia Secondary to diuretic use. Replenish  Hip pain  Likely due to malignant hypercalcemia.  CT scan shows no acute osseous abnormalities except  new vague sclerotic changes within the thoracic spine that may reflect sequelae of recently treated disease. Continue current pain management  Acute renal Failure This is due to hypercalcemia. Continue IV fluids Consider urinary catheter for comfort due to increased diuresis  DVT Prophylaxis On Lovenox  DNR as of 12/24/13 Due to poor prognosis   Apparently, she has advanced directives and living will drawn from prior visit.  I spoke with the palliative care service who plans on seeing the patient today with plan for transitioning her care to hospice. Estimated her prognosis to be less than 2 weeks with discontinuation of treatment. Other medical issues as per admitting team   **Disclaimer: This note was dictated with voice recognition software. Similar sounding  words can inadvertently be transcribed and this note may contain transcription errors which may not have been corrected upon publication of note.Sharene Butters E, PA-C 12/25/2013  8:03 AM GORSUCH, NI, MD 12/25/2013

## 2013-12-25 NOTE — Progress Notes (Signed)
This Probation officer was informed by patients sister in law that she took patients ring and watch home for safe keeping.

## 2013-12-26 DIAGNOSIS — E860 Dehydration: Secondary | ICD-10-CM

## 2013-12-26 LAB — BASIC METABOLIC PANEL
BUN: 47 mg/dL — AB (ref 6–23)
CO2: 33 mEq/L — ABNORMAL HIGH (ref 19–32)
Calcium: 12 mg/dL — ABNORMAL HIGH (ref 8.4–10.5)
Chloride: 101 mEq/L (ref 96–112)
Creatinine, Ser: 1.46 mg/dL — ABNORMAL HIGH (ref 0.50–1.10)
GFR, EST AFRICAN AMERICAN: 39 mL/min — AB (ref >90)
GFR, EST NON AFRICAN AMERICAN: 33 mL/min — AB (ref >90)
Glucose, Bld: 88 mg/dL (ref 70–99)
Potassium: 2.6 mEq/L — CL (ref 3.7–5.3)
SODIUM: 146 meq/L (ref 137–147)

## 2013-12-26 LAB — VITAMIN D 1,25 DIHYDROXY
Vitamin D 1, 25 (OH)2 Total: <8 pg/mL — ABNORMAL LOW (ref 18–72)
Vitamin D3 1, 25 (OH)2: <8 pg/mL

## 2013-12-26 MED ORDER — DEXTROSE 50 % IV SOLN: 25.0000 mL | Freq: Once | INTRAVENOUS | Status: AC | PRN

## 2013-12-26 MED ORDER — POTASSIUM CHLORIDE CRYS ER 20 MEQ PO TBCR
40.0000 meq | EXTENDED_RELEASE_TABLET | ORAL | Status: AC
  Administered 2013-12-26 (×3): 40 meq via ORAL
  Filled 2013-12-26 (×3): qty 2

## 2013-12-26 NOTE — Progress Notes (Signed)
Progress Note from the Palliative Medicine Team at [facility]:  -patient is oriented to person only, family at bedside  -patient continues to decline, poor po intake with increased lethargy     Objective: Allergies  Allergen Reactions  . Dilaudid [Hydromorphone Hcl] Nausea And Vomiting   Scheduled Meds: . furosemide  20 mg Intravenous 4 times per day  . potassium chloride  40 mEq Oral Q4H   Continuous Infusions: . sodium chloride 50 mL/hr (12/26/13 0830)   PRN Meds:.acetaminophen, acetaminophen, bisacodyl, dextrose, hydrALAZINE, LORazepam, morphine injection, ondansetron (ZOFRAN) IV, ondansetron, sodium chloride  BP 157/75  Pulse 69  Temp(Src) 98.5 F (36.9 C) (Oral)  Resp 18  Ht 5\' 4"  (1.626 m)  Wt 66 kg (145 lb 8.1 oz)  BMI 24.96 kg/m2  SpO2 93%   PPS:20 % at best    Intake/Output Summary (Last 24 hours) at 12/26/13 0938 Last data filed at 12/26/13 0500  Gross per 24 hour  Intake  708.1 ml  Output   3050 ml  Net -2341.9 ml       Physical Exam:  General: ill appearing, confused, NAD  HEENT: Dry buccal membranes, no exudate  Chest: Decreased in bases, shallow breathing pattern  CVS: RRR  Abdomen: soft NT +BS  Ext: without edema  Skin: warm and dry  Neuro: disoriented, able to follow simple commands presetnly   Labs: CBC    Component Value Date/Time   WBC 7.0 12/25/2013 0320   WBC 6.9 12/23/2013 1032   RBC 4.52 12/25/2013 0320   RBC 4.85 12/23/2013 1032   HGB 12.2 12/25/2013 0320   HGB 13.1 12/23/2013 1032   HCT 38.8 12/25/2013 0320   HCT 42.0 12/23/2013 1032   PLT 130* 12/25/2013 0320   PLT 149 12/23/2013 1032   MCV 85.8 12/25/2013 0320   MCV 86.6 12/23/2013 1032   MCH 27.0 12/25/2013 0320   MCH 27.0 12/23/2013 1032   MCHC 31.4 12/25/2013 0320   MCHC 31.2* 12/23/2013 1032   RDW 16.5* 12/25/2013 0320   RDW 16.6* 12/23/2013 1032   LYMPHSABS 1.8 12/25/2013 0320   LYMPHSABS 2.1 12/23/2013 1032   MONOABS 0.6 12/25/2013 0320   MONOABS 0.8 12/23/2013  1032   EOSABS 0.0 12/25/2013 0320   EOSABS 0.0 12/23/2013 1032   BASOSABS 0.1 12/25/2013 0320   BASOSABS 0.1 12/23/2013 1032    BMET    Component Value Date/Time   NA 146 12/26/2013 0525   NA 144 12/23/2013 1031   K 2.6* 12/26/2013 0525   K 3.2* 12/23/2013 1031   CL 101 12/26/2013 0525   CL 101 11/05/2012 0923   CO2 33* 12/26/2013 0525   CO2 35* 12/23/2013 1031   GLUCOSE 88 12/26/2013 0525   GLUCOSE 120 12/23/2013 1031   GLUCOSE 93 11/05/2012 0923   BUN 47* 12/26/2013 0525   BUN 28.3* 12/23/2013 1031   CREATININE 1.46* 12/26/2013 0525   CREATININE 1.1 12/23/2013 1031   CALCIUM 12.0* 12/26/2013 0525   CALCIUM >15.0* 12/23/2013 1500   CALCIUM 16.4 Repeated and Verified* 12/23/2013 1031   GFRNONAA 33* 12/26/2013 0525   GFRAA 39* 12/26/2013 0525    CMP     Component Value Date/Time   NA 146 12/26/2013 0525   NA 144 12/23/2013 1031   K 2.6* 12/26/2013 0525   K 3.2* 12/23/2013 1031   CL 101 12/26/2013 0525   CL 101 11/05/2012 0923   CO2 33* 12/26/2013 0525   CO2 35* 12/23/2013 1031   GLUCOSE 88 12/26/2013  0525   GLUCOSE 120 12/23/2013 1031   GLUCOSE 93 11/05/2012 0923   BUN 47* 12/26/2013 0525   BUN 28.3* 12/23/2013 1031   CREATININE 1.46* 12/26/2013 0525   CREATININE 1.1 12/23/2013 1031   CALCIUM 12.0* 12/26/2013 0525   CALCIUM >15.0* 12/23/2013 1500   CALCIUM 16.4 Repeated and Verified* 12/23/2013 1031   PROT 5.8* 12/25/2013 0320   PROT 6.9 12/23/2013 1031   ALBUMIN 2.9* 12/25/2013 0320   ALBUMIN 3.4* 12/23/2013 1031   AST 27 12/25/2013 0320   AST 23 12/23/2013 1031   ALT 8 12/25/2013 0320   ALT 9 12/23/2013 1031   ALKPHOS 78 12/25/2013 0320   ALKPHOS 97 12/23/2013 1031   BILITOT 0.6 12/25/2013 0320   BILITOT 0.76 12/23/2013 1031   GFRNONAA 33* 12/26/2013 0525   GFRAA 39* 12/26/2013 0525     Assessment and Plan: 1. Code Status: DNR/DNI-comfort is main focus of care 2. Symptom Control: 1. Anxiety/Agitation: Ativan 1 mg IV every 4 hrs prn 2. Pain/Dyspnea: Morphine 1 mg IV every 1 hr prn 3. Bowel  Regimen: Dulcolax supp prn 4. Urinary incontinence-  foley cath for comfort  3.   Psycho/Social:Emotional support to family at bedside. Hope is for comfort and dignity, all understand the         limited prognosis.  Several family members from out of town have arrived to visit.  4.   Disposition:   Hopeful for residential hospice will write for choice.  Patient Documents Completed or Given: Document Given Completed  Advanced Directives Pkt    MOST yes   DNR    Gone from [facility]    Hard Choices      Time In Time Out Total Time Spent with Patient Total Overall Time  0800 0825 25 min 25 min    Greater than 50%  of this time was spent counseling and coordinating care related to the above assessment and plan.  Wadie Lessen NP  Palliative Medicine Team Team Phone # [phone] Pager [phone]  Discussed with Dr Clementeen Graham 1

## 2013-12-26 NOTE — Progress Notes (Signed)
CSW met with patient & family Margaret Mann, Margaret Mann & others) at bedside to discuss outcome of Goals of Care meeting with Margaret Mann, PMT NP. Family requested residential hospice facility - expressed interested in [facility]. CSW awaiting call back from Margaret Mann, [facility] Liaison re: bed availability.   Margaret Mann, [facility] Clinical Social Worker cell #: [phone]

## 2013-12-26 NOTE — Progress Notes (Signed)
[facility] PROGRESS NOTE  Margaret Mann MWN:[id number] DOB: 21-Jun-1933 DOA: 12/23/2013 PCP: Margaret Casco, MD  Brief narrative  80 y.o. female with past medical history of breast cancer, uterine cancer, CLL, prior history of hypercalcemia, Anemia, CHF diastolic dysfunction. Who presents for routine follow up to Margaret Mann found to have calcium level at 16.   Assessment/Plan: Hypercalcemia Likely secondary to disease recurrence. Calcium level slowly improving with IV fluids, dilated thick and IV Solu-Medrol. [name] Patient seen by oncology consult and recommended that he has very limited treatment options. One of them being ICE chemotherapy which oncologist doubts she can tolerate.Another possibility would be Obinutuzumab, with response rates likely less than 20%.. The patient is not a candidate for bone marrow transplant. -Given her poor prognosis dilated care team was consulted. Patient and family inclined towards comfort measures and hospice. Referral made to [facility] at [facility]. -Added when necessary morphine for comfort , when necessary Ativan for anxiety.   Hypokalemia Secondary to diuretic. replenished.   Acute renal failure Secondary to hypercalcemia. Fluids. Used as patient is now comfort care  Acute encephalopathy Likely secondary to dehydration and hypocalcemia.  Hip pain Likely secondary to malignant hypercalcemia. CT scan shows no acute osseous abnormalities. Continue pain medications  Diet: Regular  Code Status: DO NOT RESUSCITATE, comfort measures Family Communication: none at bedside  Disposition Plan: Awaiting residential hospice   Consultants:  Oncology  Palliative care  Procedures:  None  Antibiotics:  None  HPI/Subjective: Patient seen and examined this morning. No overnight issues  Objective: Filed Vitals:   12/26/13 1356  BP: 166/82  Pulse: 84  Temp: 98.5 F (36.9  C)  Resp: 18    Intake/Output Summary (Last 24 hours) at 12/26/13 1409 Last data filed at 12/26/13 1356  Gross per 24 hour  Intake  828.1 ml  Output   4300 ml  Net -3471.9 ml   Filed Weights   12/24/13 0537 12/25/13 0412 12/26/13 0611  Weight: 64.5 kg (142 lb 3.2 oz) 65 kg (143 lb 4.8 oz) 66 kg (145 lb 8.1 oz)    Exam:   General:  Elderly female lying in bed appears fatigued  HEENT: No pallor, dry oral mucosa  Chest: Clear to auscultation bilaterally,   CVS: Normal S1-S2, no murmurs rub or gallop  Abdomen: Soft, nontender, nondistended, bowel sounds present  Extremities: Warm, no edema  CNS: AAO x2, confused periodically  Data Reviewed: Basic Metabolic Panel:  Recent Labs Lab 12/23/13 1500 12/24/13 0358 12/24/13 1854 12/25/13 0320 12/25/13 1020 12/26/13 0525  NA  --  143 141 140 143 146  K  --  3.2* 3.1* 3.3* 2.9* 2.6*  CL  --  95* 97 97 98 101  CO2  --  37* 35* 34* 33* 33*  GLUCOSE  --  147* 122* 126* 127* 88  BUN  --  31* 41* 43* 48* 47*  CREATININE  --  1.18* 1.45* 1.42* 1.40* 1.46*  CALCIUM >15.0* >15.0* >15.0* 14.6* 13.6* 12.0*  MG 1.6  --  1.5 1.3* 2.1  --    Liver Function Tests:  Recent Labs Lab 12/23/13 1031 12/25/13 0320  AST 23 27  ALT 9 8  ALKPHOS 97 78  BILITOT 0.76 0.6  PROT 6.9 5.8*  ALBUMIN 3.4* 2.9*   No results found for this basename: LIPASE, AMYLASE,  in the last 168 hours No results found for this basename: AMMONIA,  in the last 168 hours  CBC:  Recent Labs Lab 12/23/13 1032 12/24/13 0358 12/25/13 0320  WBC 6.9 6.7 7.0  NEUTROABS 3.9  --  4.5  HGB 13.1 12.5 12.2  HCT 42.0 40.1 38.8  MCV 86.6 86.8 85.8  PLT 149 145* 130*   Cardiac Enzymes: No results found for this basename: CKTOTAL, CKMB, CKMBINDEX, TROPONINI,  in the last 168 hours BNP (last 3 results) No results found for this basename: PROBNP,  in the last 8760 hours CBG: No results found for this basename: GLUCAP,  in the last 168 hours  Recent Results  (from the past 240 hour(s))  TECHNOLOGIST REVIEW     Status: None   Collection Time    12/23/13 10:32 AM      Result Value Ref Range Status   Technologist Review Few Metas and Myelocytes present, variant lymphs    Final     Studies: No results found.  Scheduled Meds: . furosemide  20 mg Intravenous 4 times per day  . potassium chloride  40 mEq Oral Q4H   Continuous Infusions: . sodium chloride 50 mL/hr (12/26/13 0830)      Time spent: 25 minutes    Margaret Mann, Margaret Mann  [facility] Pager [phone]. If 7PM-7AM, please contact night-coverage at [email or web address], password Margaret Mann 12/26/2013, 2:09 PM  LOS: 3 days

## 2013-12-26 NOTE — Progress Notes (Signed)
PRN Ativan not given , patient is resting/ asleep at this time.

## 2013-12-26 NOTE — Progress Notes (Signed)
I received report from Chancy Hurter and agree with assessment. No changes at the current time. Pt sleeping. SRP, RN

## 2013-12-27 DIAGNOSIS — E876 Hypokalemia: Secondary | ICD-10-CM | POA: Diagnosis not present

## 2013-12-27 DIAGNOSIS — R451 Restlessness and agitation: Secondary | ICD-10-CM | POA: Diagnosis present

## 2013-12-27 MED ORDER — BISACODYL 10 MG RE SUPP: 10.0000 mg | Freq: Every day | RECTAL | Status: AC | PRN

## 2013-12-27 MED ORDER — MORPHINE SULFATE (CONCENTRATE) 20 MG/ML PO SOLN: 5.0000 mg | ORAL | Status: AC | PRN

## 2013-12-27 MED ORDER — HEPARIN SOD (PORK) LOCK FLUSH 100 UNIT/ML IV SOLN
500.0000 [IU] | INTRAVENOUS | Status: AC | PRN
  Administered 2013-12-27: 500 [IU]

## 2013-12-27 MED ORDER — LORAZEPAM 1 MG PO TABS: 1.0000 mg | ORAL_TABLET | Freq: Four times a day (QID) | ORAL | Status: AC | PRN

## 2013-12-27 NOTE — Progress Notes (Signed)
Patient is set to discharge to [facility] today. Patient & family at bedside aware. Discharge packet given to RN, Shirlee Limerick. PTAR scheduled for 11am pickup (Service Request Id: [id number]).   Raynaldo Opitz, [facility] Clinical Social Worker cell #: [phone]

## 2013-12-27 NOTE — Consult Note (Signed)
[facility] Liaison: Received request from [place] for family interest in [facility]. Room available today 12/27/2013. HCPOA/brother Robert aware and agreeable to transfer today. Plan to meet him at 9:00 to complete paper work. Will need DC summary faxed to [phone] and RN to call report to [phone]. Thank you. Erling Conte LCSW [phone]

## 2013-12-27 NOTE — Progress Notes (Signed)
Patient discharge to [facility] given to [facility]. Patient discharge with foley catheter and right chest port accessed, flushed by IV RN prior to discharge. Family at bedside

## 2013-12-27 NOTE — Discharge Summary (Signed)
Physician Discharge Summary  Margaret Mann XKG:[id number] DOB: 11-05-1932 DOA: 12/23/2013  PCP: Margaret Casco, MD  Admit date: 12/23/2013 Discharge date: 12/27/2013  Time spent: 40 minutes  Recommendations for Outpatient Follow-up:  Discharge to residential hospice   Discharge Diagnoses:  Principal Problem:   Hypercalcemia   Active Problems:   Essential hypertension, benign   Dehydration   DIASTOLIC DYSFUNCTION   CLL (chronic lymphocytic leukemia)   Malnutrition of moderate degree   Hip pain   Palliative care encounter   Weakness generalized   Acute encephalopathy   Agitation   Hypokalemia   AKI , prerenal secondary to dehydration   Discharge Condition: guarded  Diet recommendation: regular  Filed Weights   12/25/13 0412 12/26/13 0611 12/27/13 0505  Weight: 65 kg (143 lb 4.8 oz) 66 kg (145 lb 8.1 oz) 66.1 kg (145 lb 11.6 oz)    History of present illness:  [name] is a 80 y.o. female with PMH CLL, prior history of hypercalcemia, uterine cancer, Anemia, CHF diastolic dysfunction, Breast cancer, Who presents for routine follow up to Dr Alvy Bimler office found to have calcium level at 16. Patient also relates left side hip pain that started 4 days prior to admission. She relates some difficulty with ambulation due to pain. She is notice to be some what confuse and with slow response time by her family. She denies chest pain, dyspnea, diarrhea, nausea or vomiting.    Hospital Course:  Hypercalcemia  Likely secondary to disease recurrence. Calcium level slowly improving with IV fluids, dilated thick and IV Solu-Medrol. [name]  Patient seen by oncology consult and recommended that he has very limited treatment options. One of them being ICE chemotherapy which oncologist doubts she can tolerate.Another possibility would be Obinutuzumab, with response rates likely less than 20%.. The patient is not a candidate for bone marrow  transplant.  -Given her poor prognosis palliative care team was consulted. Patient and family inclined towards comfort measures and hospice. Referral made to [facility] and pt has been accepted for residential hospice. -Added when necessary roxanol  for comfort , when necessary Ativan for anxiety. 2L o2 via nasal canula for comfort.  Severe Hypokalemia  Secondary to diuretic. replenished.   Acute renal failure  Secondary to hypercalcemia. Given aggressive fluids.   Acute encephalopathy  Likely secondary to dehydration and hypocalcemia.   Hip pain  Likely secondary to malignant hypercalcemia. CT scan shows no acute osseous abnormalities. Continue pain medications   Diet: Regular  Code Status: DO NOT RESUSCITATE, comfort measures   Family Communication: sister and brother in law at bedside  Disposition Plan: Awaiting residential hospice   Prognosis is guarded   Consultants:  Oncology  Palliative care  Procedures:  None  Antibiotics:  None   Discharge Exam: Filed Vitals:   12/27/13 0505  BP: 178/86  Pulse: 73  Temp: 98.1 F (36.7 C)  Resp: 18    General: Elderly female lying in bed appears fatigued  HEENT: No pallor, dry oral mucosa  Chest: Clear to auscultation bilaterally,  CVS: Normal S1-S2, no murmurs rub or gallop  Abdomen: Soft, nontender, nondistended, bowel sounds present  Extremities: Warm, no edema  CNS: AAO x1-2, confused periodically   Discharge Instructions You were cared for by a hospitalist during your hospital stay. If you have any questions about your discharge medications or the care you received while you were in the hospital after you are discharged, you can call the unit and  asked to speak with the hospitalist on call if the hospitalist that took care of you is not available. Once you are discharged, your primary care physician will handle any further medical issues. Please note that NO REFILLS for any discharge medications will be  authorized once you are discharged, as it is imperative that you return to your primary care physician (or establish a relationship with a primary care physician if you do not have one) for your aftercare needs so that they can reassess your need for medications and monitor your lab values.     Medication List    STOP taking these medications       acyclovir 400 MG tablet  Commonly known as:  ZOVIRAX     furosemide 20 MG tablet  Commonly known as:  LASIX     ibrutinib 140 MG capsul  Commonly known as:  IMBRUVICA     predniSONE 20 MG tablet  Commonly known as:  DELTASONE      TAKE these medications       aspirin EC 81 MG tablet  Take 81 mg by mouth daily.     bisacodyl 10 MG suppository  Commonly known as:  DULCOLAX  Place 1 suppository (10 mg total) rectally daily as needed for moderate constipation.     LORazepam 1 MG tablet  Commonly known as:  ATIVAN  Take 1 tablet (1 mg total) by mouth every 6 (six) hours as needed for anxiety.     morphine 20 MG/ML concentrated solution  Commonly known as:  ROXANOL  Take 0.25 mLs (5 mg total) by mouth every 2 (two) hours as needed for severe pain.       Allergies  Allergen Reactions  . Dilaudid [Hydromorphone Hcl] Nausea And Vomiting       Follow-up Information   Please follow up. (discharge to [facility] for residential hospice)        The results of significant diagnostics from this hospitalization (including imaging, microbiology, ancillary and laboratory) are listed below for reference.    Significant Diagnostic Studies: Dg Hip Complete Left  12/23/2013   CLINICAL DATA:  Left hip pain  EXAM: LEFT HIP - COMPLETE 2+ VIEW  COMPARISON:  None.  FINDINGS: The pelvic ring is intact. No acute fracture or dislocation is seen. No gross soft tissue abnormality is noted. Mild osteophytic changes are noted from the acetabulum.  IMPRESSION: Degenerative changes without acute abnormality.   Electronically Signed   By: Inez Catalina  M.D.   On: 12/23/2013 17:00   Ct Chest W Contrast  12/23/2013   CLINICAL DATA:  History of chronic lymphocytic leukemia. Hypercalcemia. Fatigue. Left hip pain and confusion.  EXAM: CT CHEST, ABDOMEN, AND PELVIS WITH CONTRAST  TECHNIQUE: Multidetector CT imaging of the chest, abdomen and pelvis was performed following the standard protocol during bolus administration of intravenous contrast.  CONTRAST:  160mL OMNIPAQUE IOHEXOL 300 MG/ML SOLN, 69mL OMNIPAQUE IOHEXOL 300 MG/ML SOLN  COMPARISON:  CT of the chest, abdomen and pelvis performed 06/10/2014  FINDINGS: CT CHEST FINDINGS  There has been marked interval improvement in the patient's malignancy. The previously noted large left supraclavicular and superior mediastinal mass has resolved, with a few small residual lymph nodes seen in this region. Mediastinal lymphadenopathy has also resolved, and epicardial fat pad nodes are now normal in size.  The previously noted left-sided pleural effusion has also resolved. The lungs are essentially clear bilaterally. Focal scarring is noted at the right lung apex. No pneumothorax is seen.  No masses are identified within the lung fields.  The mediastinum is unremarkable in appearance. No pericardial effusion is identified. The great vessels are grossly unremarkable in appearance. A tiny hiatal hernia seen.  The visualized portions of the thyroid gland are unremarkable in appearance. A right-sided chest port is noted ending about the cavoatrial junction. No axillary lymphadenopathy is seen.  No acute osseous abnormalities are identified. Vague sclerotic changes within the thoracic spine are new from the prior study and may reflect sequelae of treated disease.  CT ABDOMEN AND PELVIS FINDINGS  Previously noted hepatic lesions are now less apparent, with a residual vague 1.7 cm hypodensity at the left hepatic lobe. Previously noted severe mesenteric and retroperitoneal lymphadenopathy has also significantly improved, with the  largest mass now measuring 4.1 x 2.8 cm, decreased from 7.9 x 6.5 cm on the prior study. Associated soft tissue inflammation and free fluid have significantly improved.  The liver is otherwise grossly unremarkable. The spleen is within normal limits. Small stones are again noted layering dependently within the gallbladder; previously noted associated inflammation has resolved. There is minimal nonspecific wall thickening at the gallbladder fundus, without a definite mass.  The pancreas and adrenal glands are unremarkable in appearance.  Minimal nonspecific perinephric stranding is noted bilaterally, improved from the prior study. There is no evidence of hydronephrosis. No renal or ureteral stones are seen. A small parenchymal calcification is noted at the interpole region of the left kidney. A 1.5 cm left renal cyst is better characterized than on the prior study.  The stomach is otherwise unremarkable in appearance. No significant free fluid is seen within the abdomen or pelvis. No significant small bowel abnormalities are seen. Minimal calcification is seen along the abdominal aorta and its branches. No acute vascular abnormalities are seen.  Residual mesenteric and retroperitoneal lymphadenopathy is seen. Periportal and peripancreatic nodes measure up to 1.4 cm in short axis, while periaortic nodes measure up to 1.1 cm in short axis. As described above, there is a 4.1 x 2.8 cm node at the right lower quadrant, with additional surrounding lymphadenopathy; this has significantly improved from the prior study. Mild associated soft tissue inflammation is seen, also improved.  The patient is status post appendectomy. The appendiceal stump is grossly unremarkable in appearance. The colon is unremarkable in appearance.  The bladder is mildly distended and grossly unremarkable in appearance. The patient is status post hysterectomy. No suspicious adnexal masses are seen. No inguinal lymphadenopathy is appreciated.  No  acute osseous abnormalities are identified.  IMPRESSION: 1. Overall significant improvement from the prior study. Severe retroperitoneal and mesenteric lymphadenopathy has significantly improved, with resolution of associated free fluid and improvement in soft tissue inflammation. Hepatic lesions are much less apparent. Supraclavicular and superior mediastinal mass has resolved, with resolution of mediastinal and epicardial fat pad lymphadenopathy. 2. New vague sclerotic changes within the thoracic spine may reflect sequelae of recently treated disease. 3. Left-sided pleural effusion has also resolved. Focal scarring noted at the right lung apex. 4. Tiny hiatal hernia seen. 5. Cholelithiasis noted; gallbladder otherwise unremarkable in appearance. Minimal nonspecific wall thickening along the fundus of the gallbladder, without a focal mass. 6. Small left renal cyst again seen.   Electronically Signed   By: Garald Balding M.D.   On: 12/23/2013 22:59   Ct Abdomen Pelvis W Contrast  12/23/2013   CLINICAL DATA:  History of chronic lymphocytic leukemia. Hypercalcemia. Fatigue. Left hip pain and confusion.  EXAM: CT CHEST, ABDOMEN, AND PELVIS WITH CONTRAST  TECHNIQUE: Multidetector CT imaging of the chest, abdomen and pelvis was performed following the standard protocol during bolus administration of intravenous contrast.  CONTRAST:  168mL OMNIPAQUE IOHEXOL 300 MG/ML SOLN, 34mL OMNIPAQUE IOHEXOL 300 MG/ML SOLN  COMPARISON:  CT of the chest, abdomen and pelvis performed 06/10/2014  FINDINGS: CT CHEST FINDINGS  There has been marked interval improvement in the patient's malignancy. The previously noted large left supraclavicular and superior mediastinal mass has resolved, with a few small residual lymph nodes seen in this region. Mediastinal lymphadenopathy has also resolved, and epicardial fat pad nodes are now normal in size.  The previously noted left-sided pleural effusion has also resolved. The lungs are essentially  clear bilaterally. Focal scarring is noted at the right lung apex. No pneumothorax is seen. No masses are identified within the lung fields.  The mediastinum is unremarkable in appearance. No pericardial effusion is identified. The great vessels are grossly unremarkable in appearance. A tiny hiatal hernia seen.  The visualized portions of the thyroid gland are unremarkable in appearance. A right-sided chest port is noted ending about the cavoatrial junction. No axillary lymphadenopathy is seen.  No acute osseous abnormalities are identified. Vague sclerotic changes within the thoracic spine are new from the prior study and may reflect sequelae of treated disease.  CT ABDOMEN AND PELVIS FINDINGS  Previously noted hepatic lesions are now less apparent, with a residual vague 1.7 cm hypodensity at the left hepatic lobe. Previously noted severe mesenteric and retroperitoneal lymphadenopathy has also significantly improved, with the largest mass now measuring 4.1 x 2.8 cm, decreased from 7.9 x 6.5 cm on the prior study. Associated soft tissue inflammation and free fluid have significantly improved.  The liver is otherwise grossly unremarkable. The spleen is within normal limits. Small stones are again noted layering dependently within the gallbladder; previously noted associated inflammation has resolved. There is minimal nonspecific wall thickening at the gallbladder fundus, without a definite mass.  The pancreas and adrenal glands are unremarkable in appearance.  Minimal nonspecific perinephric stranding is noted bilaterally, improved from the prior study. There is no evidence of hydronephrosis. No renal or ureteral stones are seen. A small parenchymal calcification is noted at the interpole region of the left kidney. A 1.5 cm left renal cyst is better characterized than on the prior study.  The stomach is otherwise unremarkable in appearance. No significant free fluid is seen within the abdomen or pelvis. No  significant small bowel abnormalities are seen. Minimal calcification is seen along the abdominal aorta and its branches. No acute vascular abnormalities are seen.  Residual mesenteric and retroperitoneal lymphadenopathy is seen. Periportal and peripancreatic nodes measure up to 1.4 cm in short axis, while periaortic nodes measure up to 1.1 cm in short axis. As described above, there is a 4.1 x 2.8 cm node at the right lower quadrant, with additional surrounding lymphadenopathy; this has significantly improved from the prior study. Mild associated soft tissue inflammation is seen, also improved.  The patient is status post appendectomy. The appendiceal stump is grossly unremarkable in appearance. The colon is unremarkable in appearance.  The bladder is mildly distended and grossly unremarkable in appearance. The patient is status post hysterectomy. No suspicious adnexal masses are seen. No inguinal lymphadenopathy is appreciated.  No acute osseous abnormalities are identified.  IMPRESSION: 1. Overall significant improvement from the prior study. Severe retroperitoneal and mesenteric lymphadenopathy has significantly improved, with resolution of associated free fluid and improvement in soft tissue inflammation. Hepatic lesions are much less apparent.  Supraclavicular and superior mediastinal mass has resolved, with resolution of mediastinal and epicardial fat pad lymphadenopathy. 2. New vague sclerotic changes within the thoracic spine may reflect sequelae of recently treated disease. 3. Left-sided pleural effusion has also resolved. Focal scarring noted at the right lung apex. 4. Tiny hiatal hernia seen. 5. Cholelithiasis noted; gallbladder otherwise unremarkable in appearance. Minimal nonspecific wall thickening along the fundus of the gallbladder, without a focal mass. 6. Small left renal cyst again seen.   Electronically Signed   By: Garald Balding M.D.   On: 12/23/2013 22:59    Microbiology: Recent Results  (from the past 240 hour(s))  TECHNOLOGIST REVIEW     Status: None   Collection Time    12/23/13 10:32 AM      Result Value Ref Range Status   Technologist Review Few Metas and Myelocytes present, variant lymphs    Final     Labs: Basic Metabolic Panel:  Recent Labs Lab 12/23/13 1500 12/24/13 0358 12/24/13 1854 12/25/13 0320 12/25/13 1020 12/26/13 0525  NA  --  143 141 140 143 146  K  --  3.2* 3.1* 3.3* 2.9* 2.6*  CL  --  95* 97 97 98 101  CO2  --  37* 35* 34* 33* 33*  GLUCOSE  --  147* 122* 126* 127* 88  BUN  --  31* 41* 43* 48* 47*  CREATININE  --  1.18* 1.45* 1.42* 1.40* 1.46*  CALCIUM >15.0* >15.0* >15.0* 14.6* 13.6* 12.0*  MG 1.6  --  1.5 1.3* 2.1  --    Liver Function Tests:  Recent Labs Lab 12/23/13 1031 12/25/13 0320  AST 23 27  ALT 9 8  ALKPHOS 97 78  BILITOT 0.76 0.6  PROT 6.9 5.8*  ALBUMIN 3.4* 2.9*   No results found for this basename: LIPASE, AMYLASE,  in the last 168 hours No results found for this basename: AMMONIA,  in the last 168 hours CBC:  Recent Labs Lab 12/23/13 1032 12/24/13 0358 12/25/13 0320  WBC 6.9 6.7 7.0  NEUTROABS 3.9  --  4.5  HGB 13.1 12.5 12.2  HCT 42.0 40.1 38.8  MCV 86.6 86.8 85.8  PLT 149 145* 130*   Cardiac Enzymes: No results found for this basename: CKTOTAL, CKMB, CKMBINDEX, TROPONINI,  in the last 168 hours BNP: BNP (last 3 results) No results found for this basename: PROBNP,  in the last 8760 hours CBG: No results found for this basename: GLUCAP,  in the last 168 hours     Signed:  [name], NISHANT  [facility] 12/27/2013, 9:06 AM

## 2013-12-28 LAB — PTH-RELATED PEPTIDE: PTH-RELATED PEPTIDE: 23 pg/mL (ref 14–27)

## 2013-12-31 ENCOUNTER — Other Ambulatory Visit: Payer: Medicare Other

## 2013-12-31 ENCOUNTER — Ambulatory Visit: Payer: Medicare Other | Admitting: Hematology and Oncology

## 2014-01-01 NOTE — Consult Note (Signed)
I have reviewed and discussed the care of this patient in detail with the nurse practitioner including pertinent patient records, physical exam findings and data. I agree with details of this encounter.  

## 2014-01-08 DEATH — deceased

## 2014-01-17 ENCOUNTER — Ambulatory Visit (HOSPITAL_COMMUNITY): Payer: Medicare Other

## 2014-01-20 ENCOUNTER — Other Ambulatory Visit: Payer: Medicare Other

## 2014-01-22 ENCOUNTER — Ambulatory Visit: Payer: Medicare Other | Admitting: Hematology and Oncology

## 2014-10-27 ENCOUNTER — Encounter: Payer: Self-pay | Admitting: Internal Medicine

## 2015-04-18 IMAGING — CR DG HIP (WITH OR WITHOUT PELVIS) 2-3V*L*
4 series · 4 of 4 positions shown · non-contrast
Comparison: None.

CLINICAL DATA: Left hip pain

EXAM:
LEFT HIP - COMPLETE 2+ VIEW

[t pelvis a.p. (1 of 2)]
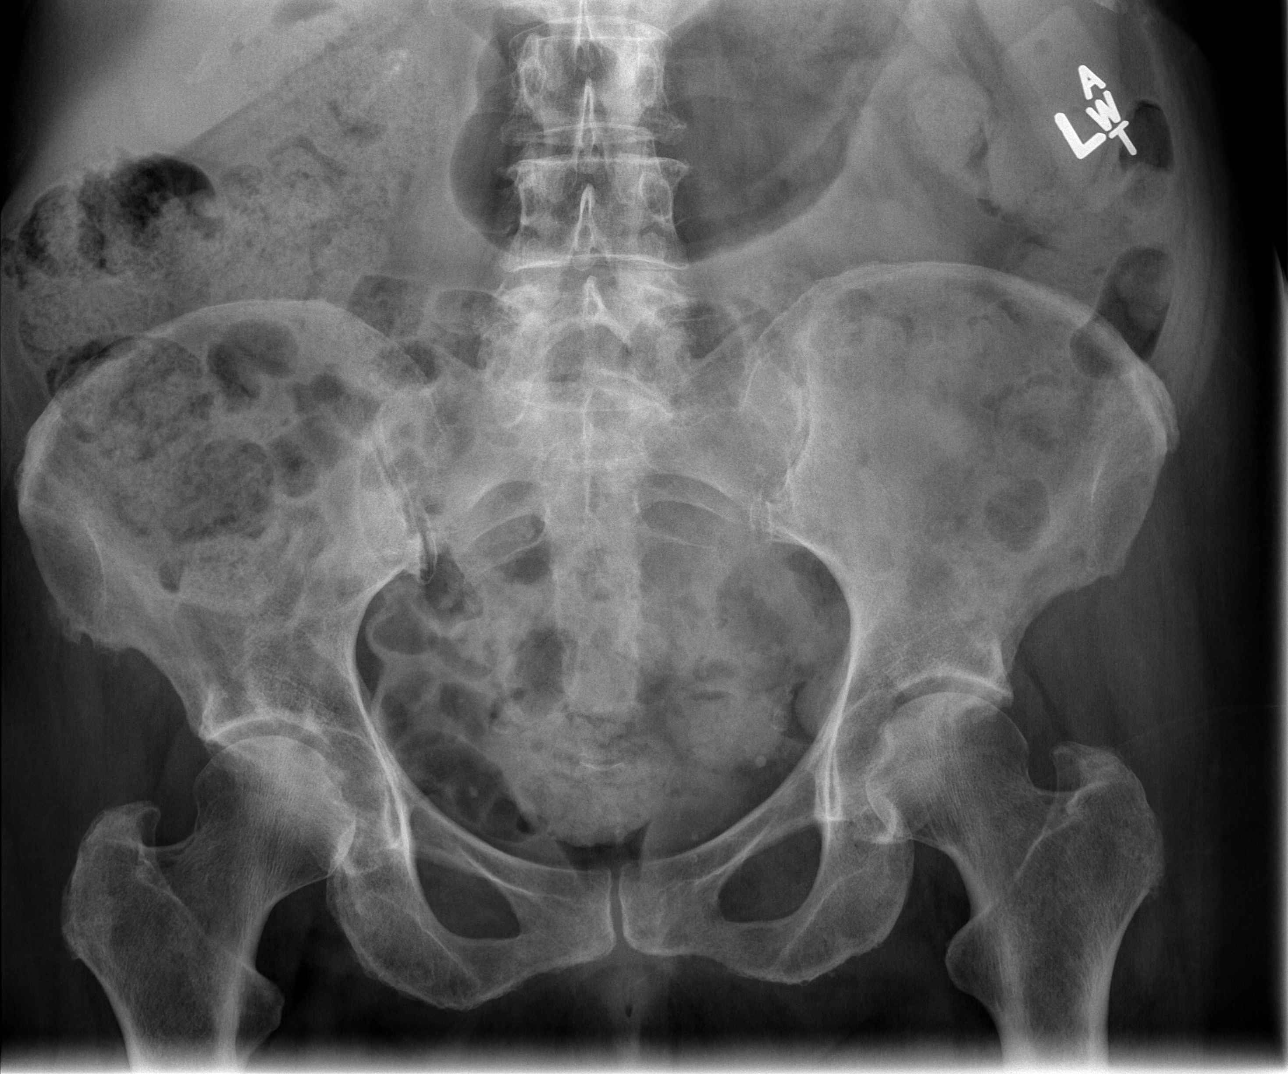

[t pelvis a.p. (2 of 2)]
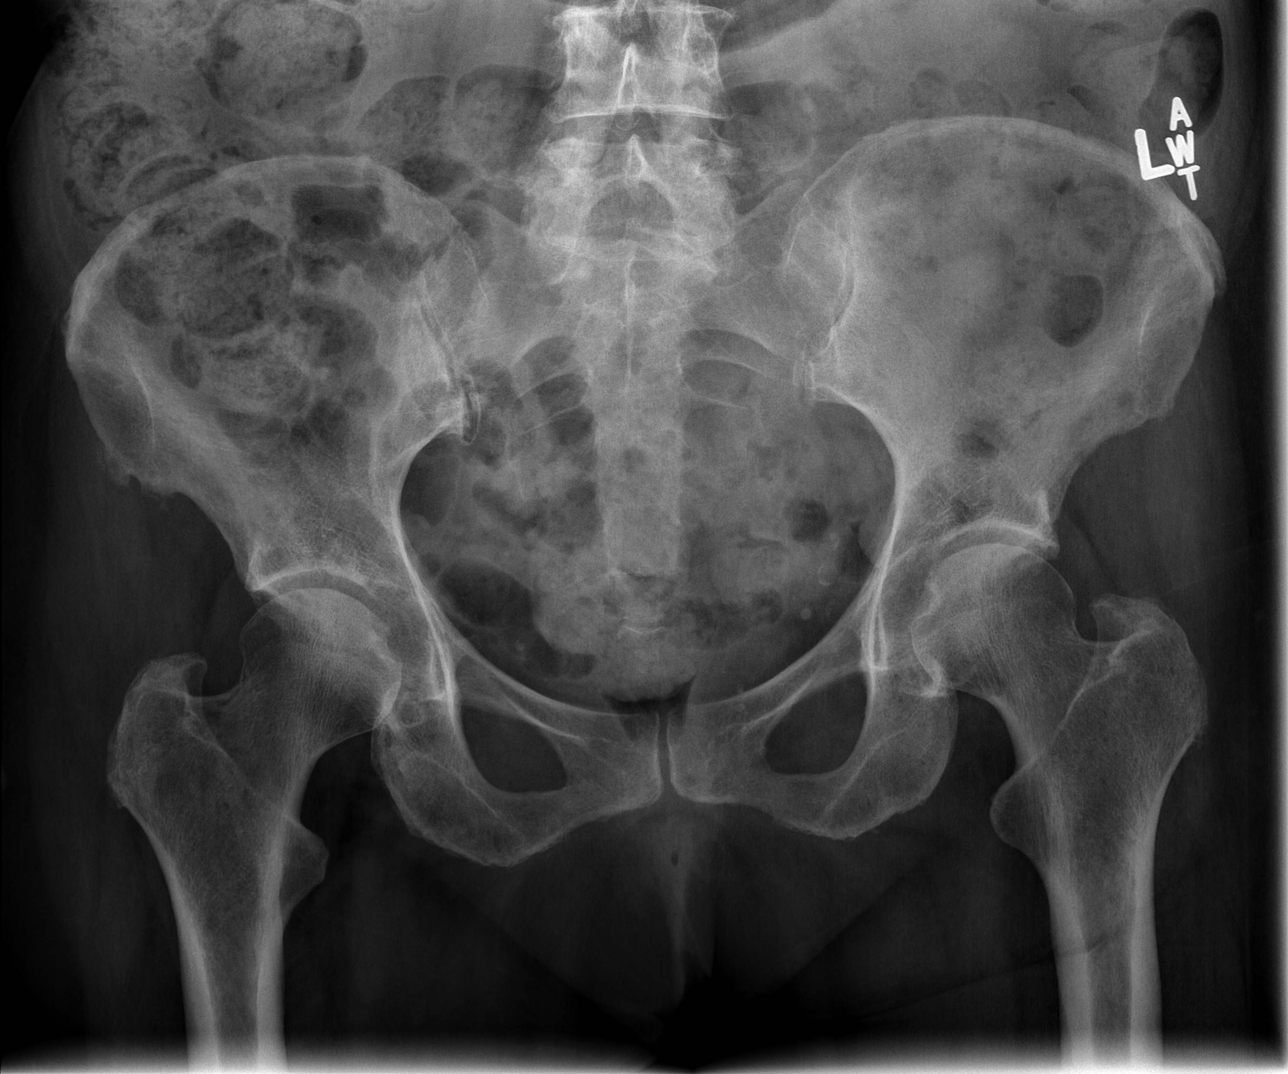

[t hip ap left]
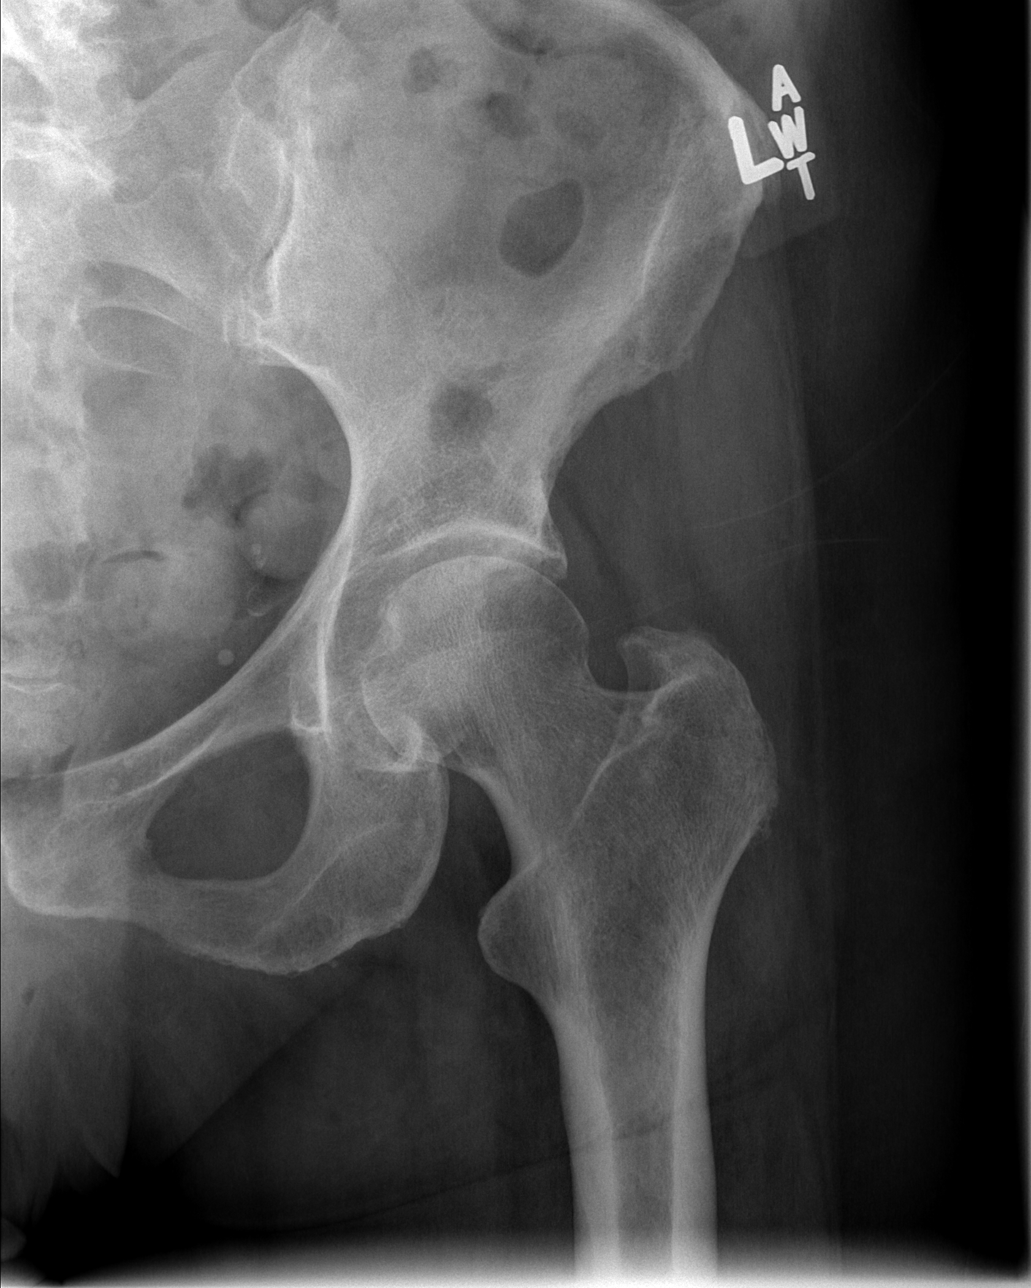

[t hip frog leg left]
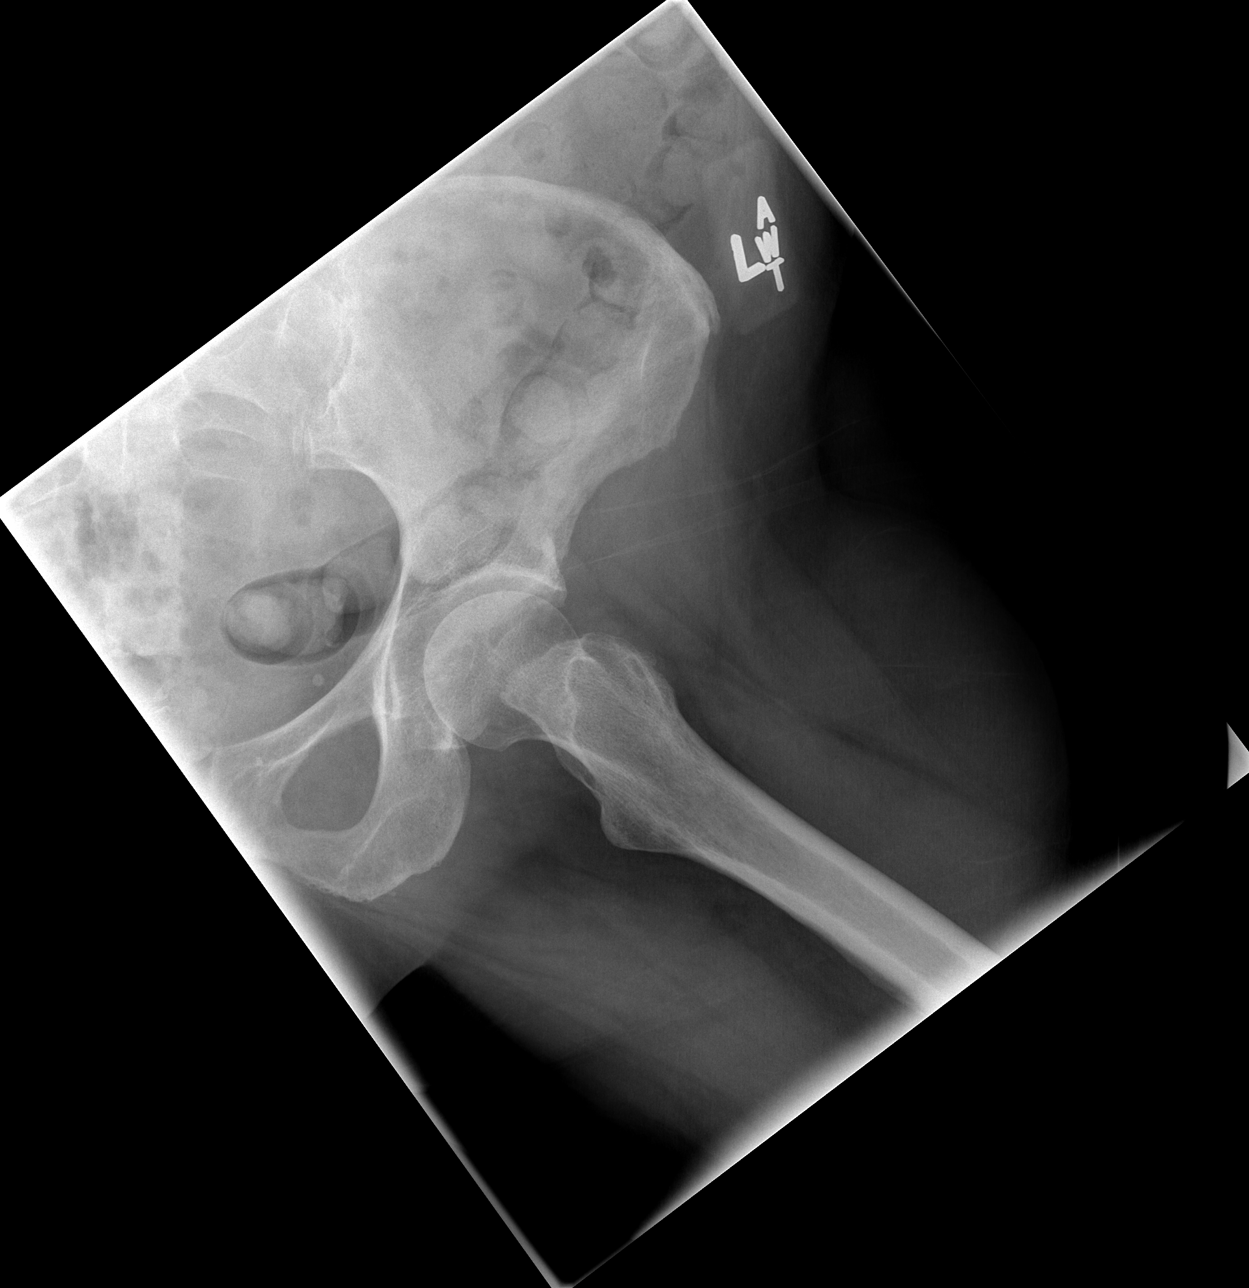

[4 of 4 positions shown; findings below may reference images not displayed]

FINDINGS: The pelvic ring is intact. No acute fracture or dislocation is seen.
No gross soft tissue abnormality is noted. Mild osteophytic changes
are noted from the acetabulum.
IMPRESSION: Degenerative changes without acute abnormality.

## 2015-06-15 ENCOUNTER — Encounter: Payer: Self-pay | Admitting: Internal Medicine
